# Patient Record
Sex: Female | Born: 1985 | Hispanic: Refuse to answer | Marital: Single | Smoking: Former smoker
Health system: Southern US, Community
[De-identification: ages and names within clinical notes are randomized; demographics above are authoritative.]

## PROBLEM LIST (undated history)

## (undated) DIAGNOSIS — R519 Headache, unspecified: Secondary | ICD-10-CM

## (undated) DIAGNOSIS — K219 Gastro-esophageal reflux disease without esophagitis: Secondary | ICD-10-CM

## (undated) DIAGNOSIS — I10 Essential (primary) hypertension: Secondary | ICD-10-CM

## (undated) DIAGNOSIS — O24419 Gestational diabetes mellitus in pregnancy, unspecified control: Secondary | ICD-10-CM

## (undated) DIAGNOSIS — E785 Hyperlipidemia, unspecified: Secondary | ICD-10-CM

## (undated) DIAGNOSIS — G4730 Sleep apnea, unspecified: Secondary | ICD-10-CM

## (undated) DIAGNOSIS — E119 Type 2 diabetes mellitus without complications: Secondary | ICD-10-CM

## (undated) DIAGNOSIS — K829 Disease of gallbladder, unspecified: Secondary | ICD-10-CM

## (undated) DIAGNOSIS — IMO0001 Reserved for inherently not codable concepts without codable children: Secondary | ICD-10-CM

## (undated) DIAGNOSIS — Z8719 Personal history of other diseases of the digestive system: Secondary | ICD-10-CM

## (undated) DIAGNOSIS — F419 Anxiety disorder, unspecified: Secondary | ICD-10-CM

## (undated) DIAGNOSIS — M2550 Pain in unspecified joint: Secondary | ICD-10-CM

## (undated) DIAGNOSIS — Z720 Tobacco use: Secondary | ICD-10-CM

## (undated) DIAGNOSIS — K5792 Diverticulitis of intestine, part unspecified, without perforation or abscess without bleeding: Secondary | ICD-10-CM

## (undated) DIAGNOSIS — M549 Dorsalgia, unspecified: Secondary | ICD-10-CM

## (undated) DIAGNOSIS — R51 Headache: Secondary | ICD-10-CM

## (undated) HISTORY — DX: Type 2 diabetes mellitus without complications: E11.9

## (undated) HISTORY — DX: Anxiety disorder, unspecified: F41.9

## (undated) HISTORY — DX: Disease of gallbladder, unspecified: K82.9

## (undated) HISTORY — DX: Sleep apnea, unspecified: G47.30

## (undated) HISTORY — DX: Dorsalgia, unspecified: M54.9

## (undated) HISTORY — DX: Diverticulitis of intestine, part unspecified, without perforation or abscess without bleeding: K57.92

## (undated) HISTORY — DX: Pain in unspecified joint: M25.50

## (undated) HISTORY — DX: Hyperlipidemia, unspecified: E78.5

## (undated) HISTORY — DX: Personal history of other diseases of the digestive system: Z87.19

## (undated) HISTORY — PX: WISDOM TOOTH EXTRACTION: SHX21

---
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
## 2003-03-20 ENCOUNTER — Emergency Department (HOSPITAL_COMMUNITY): Admission: EM | Admit: 2003-03-20 | Discharge: 2003-03-20 | Payer: Self-pay | Admitting: Emergency Medicine

## 2004-01-08 ENCOUNTER — Emergency Department (HOSPITAL_COMMUNITY): Admission: EM | Admit: 2004-01-08 | Discharge: 2004-01-08 | Payer: Self-pay | Admitting: Family Medicine

## 2006-07-27 ENCOUNTER — Ambulatory Visit (HOSPITAL_COMMUNITY): Admission: RE | Admit: 2006-07-27 | Discharge: 2006-07-27 | Payer: Self-pay | Admitting: Obstetrics and Gynecology

## 2006-08-20 ENCOUNTER — Encounter: Admission: RE | Admit: 2006-08-20 | Discharge: 2006-08-20 | Payer: Self-pay | Admitting: Family Medicine

## 2006-09-01 DIAGNOSIS — O24419 Gestational diabetes mellitus in pregnancy, unspecified control: Secondary | ICD-10-CM

## 2006-09-01 HISTORY — DX: Gestational diabetes mellitus in pregnancy, unspecified control: O24.419

## 2006-10-05 ENCOUNTER — Ambulatory Visit: Payer: Self-pay | Admitting: Obstetrics & Gynecology

## 2006-10-12 ENCOUNTER — Ambulatory Visit: Payer: Self-pay | Admitting: Family Medicine

## 2006-10-19 ENCOUNTER — Ambulatory Visit (HOSPITAL_COMMUNITY): Admission: RE | Admit: 2006-10-19 | Discharge: 2006-10-19 | Payer: Self-pay | Admitting: Obstetrics and Gynecology

## 2006-10-19 ENCOUNTER — Ambulatory Visit: Payer: Self-pay | Admitting: Obstetrics & Gynecology

## 2006-10-26 ENCOUNTER — Ambulatory Visit: Payer: Self-pay | Admitting: Obstetrics & Gynecology

## 2006-11-02 ENCOUNTER — Ambulatory Visit: Payer: Self-pay | Admitting: Family Medicine

## 2006-11-09 ENCOUNTER — Ambulatory Visit: Payer: Self-pay | Admitting: Obstetrics & Gynecology

## 2006-11-16 ENCOUNTER — Ambulatory Visit: Payer: Self-pay | Admitting: Obstetrics & Gynecology

## 2006-11-23 ENCOUNTER — Ambulatory Visit: Payer: Self-pay | Admitting: Obstetrics & Gynecology

## 2006-11-23 ENCOUNTER — Ambulatory Visit (HOSPITAL_COMMUNITY): Admission: RE | Admit: 2006-11-23 | Discharge: 2006-11-23 | Payer: Self-pay | Admitting: Obstetrics & Gynecology

## 2006-11-26 ENCOUNTER — Ambulatory Visit: Payer: Self-pay | Admitting: Gynecology

## 2006-11-30 ENCOUNTER — Inpatient Hospital Stay (HOSPITAL_COMMUNITY): Admission: AD | Admit: 2006-11-30 | Discharge: 2006-12-03 | Payer: Self-pay | Admitting: Gynecology

## 2006-11-30 ENCOUNTER — Ambulatory Visit: Payer: Self-pay | Admitting: *Deleted

## 2006-11-30 ENCOUNTER — Ambulatory Visit: Payer: Self-pay | Admitting: Obstetrics & Gynecology

## 2007-01-19 ENCOUNTER — Encounter: Admission: RE | Admit: 2007-01-19 | Discharge: 2007-01-19 | Payer: Self-pay | Admitting: Family Medicine

## 2007-02-08 ENCOUNTER — Emergency Department (HOSPITAL_COMMUNITY): Admission: EM | Admit: 2007-02-08 | Discharge: 2007-02-08 | Payer: Self-pay | Admitting: Emergency Medicine

## 2007-02-25 ENCOUNTER — Emergency Department (HOSPITAL_COMMUNITY): Admission: EM | Admit: 2007-02-25 | Discharge: 2007-02-25 | Payer: Self-pay | Admitting: Emergency Medicine

## 2007-03-08 ENCOUNTER — Ambulatory Visit: Payer: Self-pay | Admitting: Internal Medicine

## 2007-03-09 ENCOUNTER — Ambulatory Visit (HOSPITAL_COMMUNITY): Admission: RE | Admit: 2007-03-09 | Discharge: 2007-03-09 | Payer: Self-pay | Admitting: Internal Medicine

## 2007-03-23 ENCOUNTER — Emergency Department (HOSPITAL_COMMUNITY): Admission: EM | Admit: 2007-03-23 | Discharge: 2007-03-23 | Payer: Self-pay | Admitting: Emergency Medicine

## 2007-03-27 ENCOUNTER — Emergency Department (HOSPITAL_COMMUNITY): Admission: EM | Admit: 2007-03-27 | Discharge: 2007-03-27 | Payer: Self-pay | Admitting: Emergency Medicine

## 2007-04-29 ENCOUNTER — Emergency Department (HOSPITAL_COMMUNITY): Admission: EM | Admit: 2007-04-29 | Discharge: 2007-04-29 | Payer: Self-pay | Admitting: Family Medicine

## 2007-09-02 DIAGNOSIS — K219 Gastro-esophageal reflux disease without esophagitis: Secondary | ICD-10-CM

## 2007-09-02 HISTORY — DX: Gastro-esophageal reflux disease without esophagitis: K21.9

## 2008-01-05 ENCOUNTER — Emergency Department (HOSPITAL_COMMUNITY): Admission: EM | Admit: 2008-01-05 | Discharge: 2008-01-05 | Payer: Self-pay | Admitting: Emergency Medicine

## 2008-01-29 ENCOUNTER — Emergency Department (HOSPITAL_COMMUNITY): Admission: EM | Admit: 2008-01-29 | Discharge: 2008-01-30 | Payer: Self-pay | Admitting: Emergency Medicine

## 2008-02-07 ENCOUNTER — Emergency Department (HOSPITAL_COMMUNITY): Admission: EM | Admit: 2008-02-07 | Discharge: 2008-02-07 | Payer: Self-pay | Admitting: Emergency Medicine

## 2008-02-15 ENCOUNTER — Emergency Department (HOSPITAL_COMMUNITY): Admission: EM | Admit: 2008-02-15 | Discharge: 2008-02-15 | Payer: Self-pay | Admitting: Family Medicine

## 2008-07-15 ENCOUNTER — Emergency Department (HOSPITAL_COMMUNITY): Admission: EM | Admit: 2008-07-15 | Discharge: 2008-07-15 | Payer: Self-pay | Admitting: Emergency Medicine

## 2009-02-13 ENCOUNTER — Emergency Department (HOSPITAL_COMMUNITY): Admission: EM | Admit: 2009-02-13 | Discharge: 2009-02-13 | Payer: Self-pay | Admitting: Emergency Medicine

## 2009-05-19 ENCOUNTER — Emergency Department (HOSPITAL_COMMUNITY): Admission: EM | Admit: 2009-05-19 | Discharge: 2009-05-19 | Payer: Self-pay | Admitting: Emergency Medicine

## 2009-10-23 ENCOUNTER — Emergency Department (HOSPITAL_COMMUNITY): Admission: EM | Admit: 2009-10-23 | Discharge: 2009-10-23 | Payer: Self-pay | Admitting: Emergency Medicine

## 2009-11-05 ENCOUNTER — Encounter: Admission: RE | Admit: 2009-11-05 | Discharge: 2009-11-05 | Payer: Self-pay | Admitting: Family Medicine

## 2009-11-21 ENCOUNTER — Encounter: Admission: RE | Admit: 2009-11-21 | Discharge: 2009-11-21 | Payer: Self-pay | Admitting: Family Medicine

## 2010-01-14 ENCOUNTER — Emergency Department (HOSPITAL_COMMUNITY): Admission: EM | Admit: 2010-01-14 | Discharge: 2010-01-14 | Payer: Self-pay | Admitting: Emergency Medicine

## 2010-02-25 ENCOUNTER — Emergency Department (HOSPITAL_COMMUNITY): Admission: EM | Admit: 2010-02-25 | Discharge: 2010-02-25 | Payer: Self-pay | Admitting: Emergency Medicine

## 2010-03-07 ENCOUNTER — Emergency Department (HOSPITAL_COMMUNITY): Admission: EM | Admit: 2010-03-07 | Discharge: 2010-03-07 | Payer: Self-pay | Admitting: Emergency Medicine

## 2010-11-17 LAB — COMPREHENSIVE METABOLIC PANEL
ALT: 15 U/L (ref 0–35)
AST: 16 U/L (ref 0–37)
Alkaline Phosphatase: 64 U/L (ref 39–117)
BUN: 11 mg/dL (ref 6–23)
Calcium: 8.9 mg/dL (ref 8.4–10.5)
Creatinine, Ser: 0.7 mg/dL (ref 0.4–1.2)
GFR calc Af Amer: >60 mL/min (ref >60)
GFR calc non Af Amer: >60 mL/min (ref >60)
Total Bilirubin: 0.4 mg/dL (ref 0.3–1.2)

## 2010-11-17 LAB — URINALYSIS, ROUTINE W REFLEX MICROSCOPIC
Nitrite: NEGATIVE
Protein, ur: NEGATIVE mg/dL
Specific Gravity, Urine: 1.027 (ref 1.005–1.030)
Urobilinogen, UA: 0.2 mg/dL (ref 0.0–1.0)
pH: 6 (ref 5.0–8.0)

## 2010-11-17 LAB — LIPASE, BLOOD: Lipase: 40 U/L (ref 11–59)

## 2010-11-17 LAB — CBC
MCH: 30.3 pg (ref 26.0–34.0)
MCV: 88.4 fL (ref 78.0–100.0)
Platelets: 394 10*3/uL (ref 150–400)
RDW: 14 % (ref 11.5–15.5)
WBC: 16.1 10*3/uL — ABNORMAL HIGH (ref 4.0–10.5)

## 2010-11-17 LAB — DIFFERENTIAL
Basophils Relative: 0 % (ref 0–1)
Neutro Abs: 11.8 10*3/uL — ABNORMAL HIGH (ref 1.7–7.7)
Neutrophils Relative %: 73 % (ref 43–77)

## 2010-11-17 LAB — POCT CARDIAC MARKERS
Myoglobin, poc: 152 ng/mL (ref 12–200)
Troponin i, poc: <0.05 ng/mL (ref 0.00–0.09)

## 2010-12-06 LAB — POCT PREGNANCY, URINE: Preg Test, Ur: NEGATIVE

## 2011-06-16 LAB — POCT PREGNANCY, URINE
Operator id: 247071
Preg Test, Ur: NEGATIVE

## 2011-06-18 LAB — I-STAT 8, (EC8 V) (CONVERTED LAB)
Chloride: 107
TCO2: 26
pCO2, Ven: 40.3 — ABNORMAL LOW
pH, Ven: 7.392 — ABNORMAL HIGH

## 2011-06-19 LAB — POCT PREGNANCY, URINE
Operator id: 277751
Preg Test, Ur: NEGATIVE

## 2011-06-19 LAB — URINALYSIS, ROUTINE W REFLEX MICROSCOPIC
Bilirubin Urine: NEGATIVE
Glucose, UA: NEGATIVE
Hgb urine dipstick: NEGATIVE
Ketones, ur: NEGATIVE
Nitrite: NEGATIVE
Protein, ur: NEGATIVE
Specific Gravity, Urine: 1.031 — ABNORMAL HIGH
Urobilinogen, UA: 0.2
pH: 6.5

## 2014-12-29 ENCOUNTER — Emergency Department (HOSPITAL_COMMUNITY): Payer: Medicaid Other

## 2014-12-29 ENCOUNTER — Encounter (HOSPITAL_COMMUNITY): Payer: Self-pay | Admitting: *Deleted

## 2014-12-29 ENCOUNTER — Emergency Department (HOSPITAL_COMMUNITY)
Admission: EM | Admit: 2014-12-29 | Discharge: 2014-12-29 | Disposition: A | Discharge status: Home / Self Care | Payer: Medicaid Other | Attending: Emergency Medicine | Admitting: Emergency Medicine

## 2014-12-29 DIAGNOSIS — Z720 Tobacco use: Secondary | ICD-10-CM | POA: Insufficient documentation

## 2014-12-29 DIAGNOSIS — M25552 Pain in left hip: Secondary | ICD-10-CM | POA: Diagnosis not present

## 2014-12-29 DIAGNOSIS — I10 Essential (primary) hypertension: Secondary | ICD-10-CM | POA: Diagnosis not present

## 2014-12-29 DIAGNOSIS — M25559 Pain in unspecified hip: Secondary | ICD-10-CM

## 2014-12-29 HISTORY — DX: Essential (primary) hypertension: I10

## 2014-12-29 MED ORDER — MELOXICAM 7.5 MG PO TABS: 7.5000 mg | ORAL_TABLET | Freq: Every day | ORAL | Status: DC

## 2014-12-29 MED ORDER — NAPROXEN 250 MG PO TABS
500.0000 mg | ORAL_TABLET | Freq: Once | ORAL | Status: AC
  Administered 2014-12-29: 500 mg via ORAL
  Filled 2014-12-29: qty 2

## 2014-12-29 NOTE — ED Notes (Signed)
Pt states L hip pain that radiates to L leg since Thursday.  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

## 2014-12-29 NOTE — ED Provider Notes (Signed)
CSN: [id number]     Arrival date & time 12/29/14  1658 History  This chart was scribed for Felicie Mornavid Kylle Lall, NP working with Geoffery Lyonsouglas Delo, MD by Evon Slackerrance Branch, ED Scribe. This patient was seen in room TR08C/TR08C and the patient's care was started at 5:26 PM.     Chief Complaint  Patient presents with  . Hip Pain   Patient is a 29 y.o. female presenting with hip pain. The history is provided by the patient. No language interpreter was used.  Hip Pain   HPI Comments: Megan MansKetrina L Casique is a 29 y.o. female with PMHx of HTN who presents to the Emergency Department complaining of sudden sharp-aching left hip pain that began 1 day ago. Pt states that the pain radiates down into her left leg. Pt denies injury or fall. Pt doesn't report any alleviating factors. Pt states that the pain is worse with movement. Pt states she has tried ibuprofen with no relief. Pt denies fever, cough, congestion or other related symptoms.    Past Medical History  Diagnosis Date  . Hypertension    History reviewed. No pertinent past surgical history. No family history on file. History  Substance Use Topics  . Smoking status: Current Every Day Smoker -- 0.25 packs/day for 0 years    Types: Cigarettes  . Smokeless tobacco: Not on file  . Alcohol Use: Yes     Comment: occ   OB History    No data available      Review of Systems  Constitutional: Negative for fever.  HENT: Negative for congestion.   Respiratory: Negative for cough.   Musculoskeletal: Positive for arthralgias.  All other systems reviewed and are negative.   Allergies  Review of patient's allergies indicates no known allergies.  Home Medications   Prior to Admission medications   Not on File   BP 132/84 mmHg  Pulse 87  Temp(Src) 98.8 F (37.1 C) (Oral)  Resp 16  Ht 5\' 3"  (1.6 m)  Wt 248 lb (112.492 kg)  BMI 43.94 kg/m2  SpO2 99%  LMP  (LMP Unknown)   Physical Exam  Constitutional: She is oriented to person, place, and time. She  appears well-developed and well-nourished. No distress.  HENT:  Head: Normocephalic and atraumatic.  Eyes: Conjunctivae and EOM are normal.  Neck: Neck supple. No tracheal deviation present.  Cardiovascular: Normal rate, regular rhythm and normal heart sounds.   Pulmonary/Chest: Effort normal and breath sounds normal. No respiratory distress. She has no wheezes. She has no rales.  Musculoskeletal: Normal range of motion. She exhibits tenderness.  tenderness to left hip area increased with ROM, no known injury.  Neurological: She is alert and oriented to person, place, and time.  Skin: Skin is warm and dry.  Psychiatric: She has a normal mood and affect. Her behavior is normal.  Nursing note and vitals reviewed.   ED Course  Procedures (including critical care time) DIAGNOSTIC STUDIES: Oxygen Saturation is 99% on RA, normal by my interpretation.    COORDINATION OF CARE: 5:38 PM-Discussed treatment plan with pt at bedside and pt agreed to plan.     Labs Review Labs Reviewed - No data to display  Imaging Review Dg Hip Unilat With Pelvis 2-3 Views Left  12/29/2014   CLINICAL DATA:  Left hip pain for 2 days.  No known injury.  EXAM: LEFT HIP (WITH PELVIS) 2-3 VIEWS  COMPARISON:  None.  FINDINGS: There is no evidence of hip fracture or dislocation. There is no evidence  of arthropathy or other focal bone abnormality.  IMPRESSION: Negative.   Electronically Signed   By: Norva Pavlov M.D.   On: 12/29/2014 20:53     EKG Interpretation None     Radiology results reviewed and shared with patient. MDM   Final diagnoses:  Hip pain  Musculoskeletal left hip pain.  Normal hip films.  Pain seems to be more muscular than bony in nature.  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 Anti-inflammatory. Ortho follow-up if no improvement despite conservative treatment. Return precautions discussed.    I personally performed the services described in  this documentation, which was scribed in my presence. The recorded information has been reviewed and is accurate.      Felicie Morn, NP 12/30/14 1610  Geoffery Lyons, MD 12/30/14 [phone]

## 2014-12-29 NOTE — ED Notes (Signed)
Patient transported to X-ray 

## 2014-12-29 NOTE — Discharge Instructions (Signed)
Hip Pain Your hip is the joint between your upper legs and your lower pelvis. The bones, cartilage, tendons, and muscles of your hip joint perform a lot of work each day supporting your body weight and allowing you to move around. Hip pain can range from a minor ache to severe pain in one or both of your hips. Pain may be felt on the inside of the hip joint near the groin, or the outside near the buttocks and upper thigh. You may have swelling or stiffness as well.  HOME CARE INSTRUCTIONS   Take medicines only as directed by your health care provider.  Apply ice to the injured area:  Put ice in a plastic bag.  Place a towel between your skin and the bag.  Leave the ice on for 15-20 minutes at a time, 3-4 times a day.  Keep your leg raised (elevated) when possible to lessen swelling.  Avoid activities that cause pain.  Follow specific exercises as directed by your health care provider.  Sleep with a pillow between your legs on your most comfortable side.  Record how often you have hip pain, the location of the pain, and what it feels like. SEEK MEDICAL CARE IF:   You are unable to put weight on your leg.  Your hip is red or swollen or very tender to touch.  Your pain or swelling continues or worsens after 1 week.  You have increasing difficulty walking.  You have a fever. SEEK IMMEDIATE MEDICAL CARE IF:   You have fallen.  You have a sudden increase in pain and swelling in your hip. MAKE SURE YOU:   Understand these instructions.  Will watch your condition.  Will get help right away if you are not doing well or get worse. Document Released: 02/05/2010 Document Revised: 01/02/2014 Document Reviewed: 04/14/2013 [employer] Patient Information 2015 [employer]. This information is not intended to replace advice given to you by your health care provider. Make sure you discuss any questions you have with your health care provider.  

## 2015-02-02 ENCOUNTER — Other Ambulatory Visit: Payer: Self-pay | Admitting: Orthopedic Surgery

## 2015-02-02 DIAGNOSIS — M545 Low back pain: Secondary | ICD-10-CM

## 2015-02-15 ENCOUNTER — Ambulatory Visit
Admission: RE | Admit: 2015-02-15 | Discharge: 2015-02-15 | Disposition: A | Discharge status: Home / Self Care | Payer: BLUE CROSS/BLUE SHIELD | Source: Ambulatory Visit | Attending: Orthopedic Surgery | Admitting: Orthopedic Surgery

## 2015-02-15 DIAGNOSIS — M545 Low back pain: Secondary | ICD-10-CM

## 2015-02-20 ENCOUNTER — Emergency Department (HOSPITAL_COMMUNITY): Payer: BLUE CROSS/BLUE SHIELD

## 2015-02-20 ENCOUNTER — Encounter (HOSPITAL_COMMUNITY): Payer: Self-pay | Admitting: Nurse Practitioner

## 2015-02-20 ENCOUNTER — Emergency Department (HOSPITAL_COMMUNITY)
Admission: EM | Admit: 2015-02-20 | Discharge: 2015-02-20 | Disposition: A | Discharge status: Home / Self Care | Payer: BLUE CROSS/BLUE SHIELD | Attending: Emergency Medicine | Admitting: Emergency Medicine

## 2015-02-20 DIAGNOSIS — R197 Diarrhea, unspecified: Secondary | ICD-10-CM | POA: Insufficient documentation

## 2015-02-20 DIAGNOSIS — I10 Essential (primary) hypertension: Secondary | ICD-10-CM | POA: Insufficient documentation

## 2015-02-20 DIAGNOSIS — R509 Fever, unspecified: Secondary | ICD-10-CM | POA: Diagnosis present

## 2015-02-20 DIAGNOSIS — Z79899 Other long term (current) drug therapy: Secondary | ICD-10-CM | POA: Diagnosis not present

## 2015-02-20 DIAGNOSIS — J069 Acute upper respiratory infection, unspecified: Secondary | ICD-10-CM

## 2015-02-20 DIAGNOSIS — R05 Cough: Secondary | ICD-10-CM

## 2015-02-20 DIAGNOSIS — L02213 Cutaneous abscess of chest wall: Secondary | ICD-10-CM | POA: Diagnosis not present

## 2015-02-20 DIAGNOSIS — Z720 Tobacco use: Secondary | ICD-10-CM | POA: Diagnosis not present

## 2015-02-20 DIAGNOSIS — R110 Nausea: Secondary | ICD-10-CM | POA: Diagnosis not present

## 2015-02-20 DIAGNOSIS — L0291 Cutaneous abscess, unspecified: Secondary | ICD-10-CM

## 2015-02-20 DIAGNOSIS — R059 Cough, unspecified: Secondary | ICD-10-CM

## 2015-02-20 LAB — COMPREHENSIVE METABOLIC PANEL
ALT: 16 U/L (ref 14–54)
ANION GAP: 11 (ref 5–15)
AST: 18 U/L (ref 15–41)
Albumin: 3.1 g/dL — ABNORMAL LOW (ref 3.5–5.0)
Alkaline Phosphatase: 58 U/L (ref 38–126)
BILIRUBIN TOTAL: 0.3 mg/dL (ref 0.3–1.2)
BUN: 8 mg/dL (ref 6–20)
CO2: 26 mmol/L (ref 22–32)
CREATININE: 0.72 mg/dL (ref 0.44–1.00)
Calcium: 8.8 mg/dL — ABNORMAL LOW (ref 8.9–10.3)
Chloride: 102 mmol/L (ref 101–111)
GFR calc Af Amer: >60 mL/min (ref >60)
GFR calc non Af Amer: >60 mL/min (ref >60)
Glucose, Bld: 90 mg/dL (ref 65–99)
Potassium: 3.6 mmol/L (ref 3.5–5.1)
SODIUM: 139 mmol/L (ref 135–145)
TOTAL PROTEIN: 7.1 g/dL (ref 6.5–8.1)

## 2015-02-20 LAB — CBC WITH DIFFERENTIAL/PLATELET
BASOS ABS: 0 10*3/uL (ref 0.0–0.1)
Basophils Relative: 0 % (ref 0–1)
Eosinophils Absolute: 0.3 10*3/uL (ref 0.0–0.7)
Eosinophils Relative: 2 % (ref 0–5)
HCT: 35.1 % — ABNORMAL LOW (ref 36.0–46.0)
HEMOGLOBIN: 11.9 g/dL — AB (ref 12.0–15.0)
Lymphocytes Relative: 26 % (ref 12–46)
Lymphs Abs: 3.3 10*3/uL (ref 0.7–4.0)
MCH: 29 pg (ref 26.0–34.0)
MCHC: 33.9 g/dL (ref 30.0–36.0)
MCV: 85.6 fL (ref 78.0–100.0)
Monocytes Absolute: 0.9 10*3/uL (ref 0.1–1.0)
Monocytes Relative: 7 % (ref 3–12)
NEUTROS ABS: 8.5 10*3/uL — AB (ref 1.7–7.7)
NEUTROS PCT: 65 % (ref 43–77)
Platelets: 363 10*3/uL (ref 150–400)
RBC: 4.1 MIL/uL (ref 3.87–5.11)
RDW: 13 % (ref 11.5–15.5)
WBC: 13 10*3/uL — ABNORMAL HIGH (ref 4.0–10.5)

## 2015-02-20 LAB — URINALYSIS, ROUTINE W REFLEX MICROSCOPIC
Bilirubin Urine: NEGATIVE
Glucose, UA: NEGATIVE mg/dL
Ketones, ur: NEGATIVE mg/dL
LEUKOCYTES UA: NEGATIVE
NITRITE: NEGATIVE
PH: 6.5 (ref 5.0–8.0)
Protein, ur: NEGATIVE mg/dL
SPECIFIC GRAVITY, URINE: 1.016 (ref 1.005–1.030)
Urobilinogen, UA: 0.2 mg/dL (ref 0.0–1.0)

## 2015-02-20 LAB — URINE MICROSCOPIC-ADD ON

## 2015-02-20 LAB — POC URINE PREG, ED: Preg Test, Ur: NEGATIVE

## 2015-02-20 MED ORDER — LIDOCAINE-EPINEPHRINE 1 %-1:100000 IJ SOLN
10.0000 mL | Freq: Once | INTRAMUSCULAR | Status: AC
  Administered 2015-02-20: 10 mL via INTRADERMAL
  Filled 2015-02-20: qty 1

## 2015-02-20 NOTE — ED Provider Notes (Signed)
CSN: [id number]     Arrival date & time 02/20/15  1440 History   First MD Initiated Contact with Patient 02/20/15 1635     Chief Complaint  Patient presents with  . Fever     (Consider location/radiation/quality/duration/timing/severity/associated sxs/prior Treatment) Patient is a 29 y.o. female presenting with fever and abscess.  Fever Temp source:  Subjective Onset quality:  Gradual Duration:  3 days Timing:  Constant Progression:  Waxing and waning Chronicity:  New Relieved by:  None tried Worsened by:  Nothing tried Ineffective treatments:  None tried Associated symptoms: chills, congestion, cough, diarrhea, headaches, nausea, rhinorrhea and sore throat   Associated symptoms: no chest pain, no dysuria, no ear pain, no rash and no vomiting   Risk factors: sick contacts (son had URI 2 weeks ago)   Risk factors: no hx of cancer, no immunosuppression and no occupational exposure   Abscess Location:  Torso Torso abscess location: mid chest. Size:  1.5 cm Abscess quality: draining, fluctuance, induration and redness   Red streaking: no   Duration:  2 days Progression:  Worsening Chronicity:  New Context: not diabetes, not immunosuppression, not injected drug use, not insect bite/sting and not skin injury   Relieved by:  None tried Worsened by:  Nothing tried Ineffective treatments:  None tried Associated symptoms: fever, headaches and nausea   Associated symptoms: no fatigue and no vomiting     Past Medical History  Diagnosis Date  . Hypertension    History reviewed. No pertinent past surgical history. History reviewed. No pertinent family history. History  Substance Use Topics  . Smoking status: Current Every Day Smoker -- 0.25 packs/day for 0 years    Types: Cigarettes  . Smokeless tobacco: Not on file  . Alcohol Use: Yes     Comment: occ   OB History    No data available     Review of Systems  Constitutional: Positive for fever and chills. Negative for  appetite change and fatigue.  HENT: Positive for congestion, rhinorrhea and sore throat. Negative for ear pain, facial swelling and mouth sores.   Eyes: Negative for visual disturbance.  Respiratory: Positive for cough. Negative for chest tightness and shortness of breath.   Cardiovascular: Negative for chest pain and palpitations.  Gastrointestinal: Positive for nausea and diarrhea. Negative for vomiting, abdominal pain and blood in stool.  Endocrine: Negative for cold intolerance and heat intolerance.  Genitourinary: Negative for dysuria, frequency, decreased urine volume and difficulty urinating.  Musculoskeletal: Negative for back pain and neck stiffness.  Skin: Negative for rash.  Neurological: Positive for headaches. Negative for dizziness, weakness and light-headedness.  All other systems reviewed and are negative.     Allergies  Review of patient's allergies indicates no known allergies.  Home Medications   Prior to Admission medications   Medication Sig Start Date End Date Taking? Authorizing Provider  meloxicam (MOBIC) 7.5 MG tablet Take 1 tablet (7.5 mg total) by mouth daily. 12/29/14   Felicie Morn, NP   BP 126/70 mmHg  Pulse 83  Temp(Src) 98.5 F (36.9 C) (Oral)  Resp 18  Ht  (1.6 m)  Wt 248 lb 4.8 oz (112.628 kg)  BMI 44.00 kg/m2  SpO2 100% Physical Exam  Constitutional: She is oriented to person, place, and time. She appears well-developed and well-nourished. No distress.  HENT:  Head: Normocephalic and atraumatic.  Right Ear: External ear normal.  Left Ear: External ear normal.  Nose: Nose normal.  Mild irritation of posterior oropharynx. No  exudate. No PTA.  Eyes: Conjunctivae and EOM are normal. Pupils are equal, round, and reactive to light. Right eye exhibits no discharge. Left eye exhibits no discharge. No scleral icterus.  Neck: Normal range of motion. Neck supple.  No LAD  Cardiovascular: Normal rate, regular rhythm and normal heart sounds.  Exam  reveals no gallop and no friction rub.   No murmur heard. Pulmonary/Chest: Effort normal and breath sounds normal. No stridor. No respiratory distress. She has no wheezes.  Small 1.5 cm abscess over the sternum  Abdominal: Soft. She exhibits no distension. There is no tenderness.  Musculoskeletal: She exhibits no edema or tenderness.  Neurological: She is alert and oriented to person, place, and time.  Skin: Skin is warm and dry. No rash noted. She is not diaphoretic. No erythema.  Psychiatric: She has a normal mood and affect.    ED Course  INCISION AND DRAINAGE Date/Time: 02/20/2015 5:35 PM Performed by: Drema Pry Authorized by: Zadie Rhine Consent: Verbal consent obtained. Consent given by: patient Patient identity confirmed: arm band Type: abscess Body area: trunk Location details: chest Anesthesia: local infiltration Local anesthetic: lidocaine 1% with epinephrine Anesthetic total: 2 ml Patient sedated: no Scalpel size: 11 Incision type: elliptical Complexity: complex Drainage: purulent Drainage amount: moderate Wound treatment: wound left open Patient tolerance: Patient tolerated the procedure well with no immediate complications   (including critical care time) Labs Review Labs Reviewed  COMPREHENSIVE METABOLIC PANEL - Abnormal; Notable for the following:    Calcium 8.8 (*)    Albumin 3.1 (*)    All other components within normal limits  CBC WITH DIFFERENTIAL/PLATELET - Abnormal; Notable for the following:    WBC 13.0 (*)    Hemoglobin 11.9 (*)    HCT 35.1 (*)    Neutro Abs 8.5 (*)    All other components within normal limits  URINALYSIS, ROUTINE W REFLEX MICROSCOPIC (NOT AT [facility])  POC URINE PREG, ED    Imaging Review Dg Chest 2 View  02/20/2015   CLINICAL DATA:  Cough and fever for 3 days  EXAM: CHEST  2 VIEW  COMPARISON:  02/25/2010  FINDINGS: The heart size and mediastinal contours are within normal limits. Both lungs are clear. The visualized  skeletal structures are unremarkable.  IMPRESSION: No active cardiopulmonary disease.   Electronically Signed   By: Signa Kell M.D.   On: 02/20/2015 17:18     EKG Interpretation None      MDM   29 year old female with no pertinent past medical history presents with 2 days of fevers, chills, URI symptoms, cough, diarrhea. Patient reports that her son had viral URI 2 weeks ago. No other sick contacts reported. No recent travels. No history of HIV. Rest of the history and exam as above. No evidence of meningitis, or toxicity. Presentation consistent with likely viral infection. We'll obtain chest x-ray to rule out pneumonia. No evidence of pneumonia or other infectious process on chest x-ray. Abdomen benign. Doubt appendicitis, diverticulitis, cholecystitis, small bowel obstruction.   Additionally the patient reported abscess in the mid chest. Ultrasound obtained showing fluid filled lesions suggestive of a drainable abscess. I&D was performed as above.  Patient is well-appearing, nontoxic, afebrile, with stable vital signs. She is in good condition is stable for discharge with strict return precautions. Patient follow-up with her PCP as needed. Patient will follow-up with [facility] in 2 days for wound check.  Seen in conjunction with Dr. Bebe Shaggy.  Final diagnoses:  None  Drema Pry, MD 02/20/15 1758  Zadie Rhine, MD 02/20/15 [phone]

## 2015-02-20 NOTE — Discharge Instructions (Signed)
Abscess An abscess (boil or furuncle) is an infected area on or under the skin. This area is filled with yellowish-white fluid (pus) and other material (debris). HOME CARE   Only take medicines as told by your doctor.  If you were given antibiotic medicine, take it as directed. Finish the medicine even if you start to feel better.  If gauze is used, follow your doctor's directions for changing the gauze.  To avoid spreading the infection:  Keep your abscess covered with a bandage.  Wash your hands well.  Do not share personal care items, towels, or whirlpools with others.  Avoid skin contact with others.  Keep your skin and clothes clean around the abscess.  Keep all doctor visits as told. GET HELP RIGHT AWAY IF:   You have more pain, puffiness (swelling), or redness in the wound site.  You have more fluid or blood coming from the wound site.  You have muscle aches, chills, or you feel sick.  You have a fever. MAKE SURE YOU:   Understand these instructions.  Will watch your condition.  Will get help right away if you are not doing well or get worse. Document Released: 02/04/2008 Document Revised: 02/17/2012 Document Reviewed: 10/31/2011 [facility] Patient Information 2015 [place], [place]. This information is not intended to replace advice given to you by your health care provider. Make sure you discuss any questions you have with your health care provider.  Diarrhea Diarrhea is frequent loose and watery bowel movements. It can cause you to feel weak and dehydrated. Dehydration can cause you to become tired and thirsty, have a dry mouth, and have decreased urination that often is dark yellow. Diarrhea is a sign of another problem, most often an infection that will not last long. In most cases, diarrhea typically lasts 2-3 days. However, it can last longer if it is a sign of something more serious. It is important to treat your diarrhea as directed by your caregiver to lessen  or prevent future episodes of diarrhea. CAUSES  Some common causes include:  Gastrointestinal infections caused by viruses, bacteria, or parasites.  Food poisoning or food allergies.  Certain medicines, such as antibiotics, chemotherapy, and laxatives.  Artificial sweeteners and fructose.  Digestive disorders. HOME CARE INSTRUCTIONS  Ensure adequate fluid intake (hydration): Have 1 cup (8 oz) of fluid for each diarrhea episode. Avoid fluids that contain simple sugars or sports drinks, fruit juices, whole milk products, and sodas. Your urine should be clear or pale yellow if you are drinking enough fluids. Hydrate with an oral rehydration solution that you can purchase at pharmacies, retail stores, and online. You can prepare an oral rehydration solution at home by mixing the following ingredients together:   - tsp table salt.   tsp baking soda.   tsp salt substitute containing potassium chloride.  1  tablespoons sugar.  1 L (34 oz) of water.  Certain foods and beverages may increase the speed at which food moves through the gastrointestinal (GI) tract. These foods and beverages should be avoided and include:  Caffeinated and alcoholic beverages.  High-fiber foods, such as raw fruits and vegetables, nuts, seeds, and whole grain breads and cereals.  Foods and beverages sweetened with sugar alcohols, such as xylitol, sorbitol, and mannitol.  Some foods may be well tolerated and may help thicken stool including:  Starchy foods, such as rice, toast, pasta, low-sugar cereal, oatmeal, grits, baked potatoes, crackers, and bagels.  Bananas.  Applesauce.  Add probiotic-rich foods to help increase healthy  bacteria in the GI tract, such as yogurt and fermented milk products.  Wash your hands well after each diarrhea episode.  Only take over-the-counter or prescription medicines as directed by your caregiver.  Take a warm bath to relieve any burning or pain from frequent diarrhea  episodes. SEEK IMMEDIATE MEDICAL CARE IF:   You are unable to keep fluids down.  You have persistent vomiting.  You have blood in your stool, or your stools are black and tarry.  You do not urinate in 6-8 hours, or there is only a small amount of very dark urine.  You have abdominal pain that increases or localizes.  You have weakness, dizziness, confusion, or light-headedness.  You have a severe headache.  Your diarrhea gets worse or does not get better.  You have a fever or persistent symptoms for more than 2-3 days.  You have a fever and your symptoms suddenly get worse. MAKE SURE YOU:   Understand these instructions.  Will watch your condition.  Will get help right away if you are not doing well or get worse. Document Released: 08/08/2002 Document Revised: 01/02/2014 Document Reviewed: 04/25/2012 [facility] Patient Information 2015 [place], [place]. This information is not intended to replace advice given to you by your health care provider. Make sure you discuss any questions you have with your health care provider.  Upper Respiratory Infection, Adult An upper respiratory infection (URI) is also sometimes known as the common cold. The upper respiratory tract includes the nose, sinuses, throat, trachea, and bronchi. Bronchi are the airways leading to the lungs. Most people improve within 1 week, but symptoms can last up to 2 weeks. A residual cough may last even longer.  CAUSES Many different viruses can infect the tissues lining the upper respiratory tract. The tissues become irritated and inflamed and often become very moist. Mucus production is also common. A cold is contagious. You can easily spread the virus to others by oral contact. This includes kissing, sharing a glass, coughing, or sneezing. Touching your mouth or nose and then touching a surface, which is then touched by another person, can also spread the virus. SYMPTOMS  Symptoms typically develop 1 to 3 days after  you come in contact with a cold virus. Symptoms vary from person to person. They may include:  Runny nose.  Sneezing.  Nasal congestion.  Sinus irritation.  Sore throat.  Loss of voice (laryngitis).  Cough.  Fatigue.  Muscle aches.  Loss of appetite.  Headache.  Low-grade fever. DIAGNOSIS  You might diagnose your own cold based on familiar symptoms, since most people get a cold 2 to 3 times a year. Your caregiver can confirm this based on your exam. Most importantly, your caregiver can check that your symptoms are not due to another disease such as strep throat, sinusitis, pneumonia, asthma, or epiglottitis. Blood tests, throat tests, and X-rays are not necessary to diagnose a common cold, but they may sometimes be helpful in excluding other more serious diseases. Your caregiver will decide if any further tests are required. RISKS AND COMPLICATIONS  You may be at risk for a more severe case of the common cold if you smoke cigarettes, have chronic heart disease (such as heart failure) or lung disease (such as asthma), or if you have a weakened immune system. The very young and very old are also at risk for more serious infections. Bacterial sinusitis, middle ear infections, and bacterial pneumonia can complicate the common cold. The common cold can worsen asthma and chronic  obstructive pulmonary disease (COPD). Sometimes, these complications can require emergency medical care and may be life-threatening. PREVENTION  The best way to protect against getting a cold is to practice good hygiene. Avoid oral or hand contact with people with cold symptoms. Wash your hands often if contact occurs. There is no clear evidence that vitamin C, vitamin E, echinacea, or exercise reduces the chance of developing a cold. However, it is always recommended to get plenty of rest and practice good nutrition. TREATMENT  Treatment is directed at relieving symptoms. There is no cure. Antibiotics are not  effective, because the infection is caused by a virus, not by bacteria. Treatment may include:  Increased fluid intake. Sports drinks offer valuable electrolytes, sugars, and fluids.  Breathing heated mist or steam (vaporizer or shower).  Eating chicken soup or other clear broths, and maintaining good nutrition.  Getting plenty of rest.  Using gargles or lozenges for comfort.  Controlling fevers with ibuprofen or acetaminophen as directed by your caregiver.  Increasing usage of your inhaler if you have asthma. Zinc gel and zinc lozenges, taken in the first 24 hours of the common cold, can shorten the duration and lessen the severity of symptoms. Pain medicines may help with fever, muscle aches, and throat pain. A variety of non-prescription medicines are available to treat congestion and runny nose. Your caregiver can make recommendations and may suggest nasal or lung inhalers for other symptoms.  HOME CARE INSTRUCTIONS   Only take over-the-counter or prescription medicines for pain, discomfort, or fever as directed by your caregiver.  Use a warm mist humidifier or inhale steam from a shower to increase air moisture. This may keep secretions moist and make it easier to breathe.  Drink enough water and fluids to keep your urine clear or pale yellow.  Rest as needed.  Return to work when your temperature has returned to normal or as your caregiver advises. You may need to stay home longer to avoid infecting others. You can also use a face mask and careful hand washing to prevent spread of the virus. SEEK MEDICAL CARE IF:   After the first few days, you feel you are getting worse rather than better.  You need your caregiver's advice about medicines to control symptoms.  You develop chills, worsening shortness of breath, or brown or red sputum. These may be signs of pneumonia.  You develop yellow or brown nasal discharge or pain in the face, especially when you bend forward. These may  be signs of sinusitis.  You develop a fever, swollen neck glands, pain with swallowing, or white areas in the back of your throat. These may be signs of strep throat. SEEK IMMEDIATE MEDICAL CARE IF:   You have a fever.  You develop severe or persistent headache, ear pain, sinus pain, or chest pain.  You develop wheezing, a prolonged cough, cough up blood, or have a change in your usual mucus (if you have chronic lung disease).  You develop sore muscles or a stiff neck. Document Released: 02/11/2001 Document Revised: 11/10/2011 Document Reviewed: 11/23/2013 [facility] Patient Information 2015 [place], [place]. This information is not intended to replace advice given to you by your health care provider. Make sure you discuss any questions you have with your health care provider.

## 2015-02-20 NOTE — ED Notes (Signed)
Patient transported to X-ray 

## 2015-02-20 NOTE — ED Notes (Signed)
Dr. Cardama at the bedside.  

## 2015-02-20 NOTE — ED Notes (Signed)
She c/o headaches, chills, fevers, abd pain, diarrhea increasingly worse since Sunday. She also noticed a painful lump on her chest between her breasts this week that was draining pus.

## 2015-08-10 ENCOUNTER — Emergency Department (HOSPITAL_COMMUNITY)
Admission: EM | Admit: 2015-08-10 | Discharge: 2015-08-10 | Disposition: A | Discharge status: Home / Self Care | Payer: Medicaid Other | Attending: Emergency Medicine | Admitting: Emergency Medicine

## 2015-08-10 ENCOUNTER — Emergency Department (HOSPITAL_COMMUNITY): Payer: Medicaid Other

## 2015-08-10 ENCOUNTER — Encounter (HOSPITAL_COMMUNITY): Payer: Self-pay | Admitting: Emergency Medicine

## 2015-08-10 DIAGNOSIS — E669 Obesity, unspecified: Secondary | ICD-10-CM | POA: Diagnosis not present

## 2015-08-10 DIAGNOSIS — Z3202 Encounter for pregnancy test, result negative: Secondary | ICD-10-CM | POA: Insufficient documentation

## 2015-08-10 DIAGNOSIS — F17210 Nicotine dependence, cigarettes, uncomplicated: Secondary | ICD-10-CM | POA: Insufficient documentation

## 2015-08-10 DIAGNOSIS — K8020 Calculus of gallbladder without cholecystitis without obstruction: Secondary | ICD-10-CM | POA: Diagnosis not present

## 2015-08-10 DIAGNOSIS — I10 Essential (primary) hypertension: Secondary | ICD-10-CM | POA: Insufficient documentation

## 2015-08-10 DIAGNOSIS — R109 Unspecified abdominal pain: Secondary | ICD-10-CM | POA: Diagnosis present

## 2015-08-10 DIAGNOSIS — K8050 Calculus of bile duct without cholangitis or cholecystitis without obstruction: Secondary | ICD-10-CM

## 2015-08-10 LAB — CBC
HCT: 40.5 % (ref 36.0–46.0)
Hemoglobin: 13.3 g/dL (ref 12.0–15.0)
MCH: 29.4 pg (ref 26.0–34.0)
MCHC: 32.8 g/dL (ref 30.0–36.0)
MCV: 89.4 fL (ref 78.0–100.0)
PLATELETS: 463 10*3/uL — AB (ref 150–400)
RBC: 4.53 MIL/uL (ref 3.87–5.11)
RDW: 13.1 % (ref 11.5–15.5)
WBC: 14.6 10*3/uL — AB (ref 4.0–10.5)

## 2015-08-10 LAB — COMPREHENSIVE METABOLIC PANEL
ALBUMIN: 4.1 g/dL (ref 3.5–5.0)
ALT: 21 U/L (ref 14–54)
ANION GAP: 9 (ref 5–15)
AST: 18 U/L (ref 15–41)
Alkaline Phosphatase: 70 U/L (ref 38–126)
BUN: 14 mg/dL (ref 6–20)
CHLORIDE: 107 mmol/L (ref 101–111)
CO2: 23 mmol/L (ref 22–32)
CREATININE: 0.7 mg/dL (ref 0.44–1.00)
Calcium: 9.3 mg/dL (ref 8.9–10.3)
GFR calc non Af Amer: >60 mL/min (ref >60)
Glucose, Bld: 88 mg/dL (ref 65–99)
Potassium: 3.7 mmol/L (ref 3.5–5.1)
SODIUM: 139 mmol/L (ref 135–145)
Total Bilirubin: 0.4 mg/dL (ref 0.3–1.2)
Total Protein: 8.2 g/dL — ABNORMAL HIGH (ref 6.5–8.1)

## 2015-08-10 LAB — URINALYSIS, ROUTINE W REFLEX MICROSCOPIC
Bilirubin Urine: NEGATIVE
GLUCOSE, UA: NEGATIVE mg/dL
HGB URINE DIPSTICK: NEGATIVE
KETONES UR: NEGATIVE mg/dL
Leukocytes, UA: NEGATIVE
Nitrite: NEGATIVE
Protein, ur: NEGATIVE mg/dL
Specific Gravity, Urine: 1.028 (ref 1.005–1.030)
pH: 6 (ref 5.0–8.0)

## 2015-08-10 LAB — LIPASE, BLOOD: Lipase: 40 U/L (ref 11–51)

## 2015-08-10 LAB — PREGNANCY, URINE: Preg Test, Ur: NEGATIVE

## 2015-08-10 MED ORDER — IOHEXOL 300 MG/ML  SOLN
100.0000 mL | Freq: Once | INTRAMUSCULAR | Status: AC | PRN
  Administered 2015-08-10: 100 mL via INTRAVENOUS

## 2015-08-10 MED ORDER — IOHEXOL 300 MG/ML  SOLN
25.0000 mL | Freq: Once | INTRAMUSCULAR | Status: AC | PRN
  Administered 2015-08-10: 25 mL via ORAL

## 2015-08-10 NOTE — Discharge Instructions (Signed)

## 2015-08-10 NOTE — ED Provider Notes (Signed)
CSN: [id number]     Arrival date & time 08/10/15  1714 History   First MD Initiated Contact with Patient 08/10/15 1905     Chief Complaint  Patient presents with  . Abdominal Pain  . Back Pain     (Consider location/radiation/quality/duration/timing/severity/associated sxs/prior Treatment) Patient is a 29 y.o. female presenting with abdominal pain and back pain. The history is provided by the patient.  Abdominal Pain Associated symptoms: no chest pain, no diarrhea, no nausea, no shortness of breath and no vomiting   Back Pain Associated symptoms: abdominal pain   Associated symptoms: no chest pain, no headaches, no numbness and no weakness    patient presents with pain on her right side. Began around 5 days ago. States she cannot lay on that side. No change in eating. No nausea vomiting or diarrhea. No dysuria. No fevers. States the pain is been constant. No change in appetite. No change in bowel habits. No vaginal bleeding or discharge. States she is on the depo shot so she does not have regular menses. The pain is dull and sharp. It began in the same location that it currently is.  Past Medical History  Diagnosis Date  . Hypertension    History reviewed. No pertinent past surgical history. History reviewed. No pertinent family history. Social History  Substance Use Topics  . Smoking status: Current Every Day Smoker -- 0.25 packs/day for 0 years    Types: Cigarettes  . Smokeless tobacco: None  . Alcohol Use: Yes     Comment: occ   OB History    No data available     Review of Systems  Constitutional: Negative for activity change and appetite change.  Eyes: Negative for pain.  Respiratory: Negative for chest tightness and shortness of breath.   Cardiovascular: Negative for chest pain and leg swelling.  Gastrointestinal: Positive for abdominal pain. Negative for nausea, vomiting and diarrhea.  Genitourinary: Positive for flank pain.  Musculoskeletal: Positive for back pain.  Negative for neck stiffness.  Skin: Negative for rash.  Neurological: Negative for weakness, numbness and headaches.  Psychiatric/Behavioral: Negative for behavioral problems.      Allergies  Review of patient's allergies indicates no known allergies.  Home Medications   Prior to Admission medications   Not on File   BP 142/88 mmHg  Pulse 71  Temp(Src) 98.1 F (36.7 C) (Oral)  Resp 18  SpO2 100%  LMP  Physical Exam  Constitutional:  Patient is obese  HENT:  Head: Atraumatic.  Neck: Neck supple.  Cardiovascular: Normal rate.   Pulmonary/Chest: Effort normal.  Abdominal: Soft. She exhibits no mass. There is tenderness. There is no rebound and no guarding.  Mild right lower quadrant tenderness. CVA tenderness on right side.  Musculoskeletal: Normal range of motion.  Neurological: She is alert.  Skin: Skin is warm.    ED Course  Procedures (including critical care time) Labs Review Labs Reviewed  COMPREHENSIVE METABOLIC PANEL - Abnormal; Notable for the following:    Total Protein 8.2 (*)    All other components within normal limits  CBC - Abnormal; Notable for the following:    WBC 14.6 (*)    Platelets 463 (*)    All other components within normal limits  LIPASE, BLOOD  URINALYSIS, ROUTINE W REFLEX MICROSCOPIC (NOT AT [facility])  PREGNANCY, URINE    Imaging Review Ct Abdomen Pelvis W Contrast  08/10/2015  CLINICAL DATA:  Right lower quadrant pain for 5 days radiating to the back. EXAM: CT  ABDOMEN AND PELVIS WITH CONTRAST TECHNIQUE: Multidetector CT imaging of the abdomen and pelvis was performed using the standard protocol following bolus administration of intravenous contrast. CONTRAST:  25mL OMNIPAQUE IOHEXOL 300 MG/ML SOLN, 100mL OMNIPAQUE IOHEXOL 300 MG/ML SOLN COMPARISON:  None. FINDINGS: Lower chest:  The included lung bases are clear. Liver: No focal lesion. Hepatobiliary: Gallbladder filled with multiple stones. No evident wall thickening or pericholecystic  inflammation. No biliary dilatation or calcified choledocholithiasis. Pancreas: Normal. Spleen: Normal. Adrenal glands: No nodule. Kidneys: Symmetric renal enhancement. No hydronephrosis. No perinephric stranding or localizing renal abnormality. Stomach/Bowel: Stomach physiologically distended. There are no dilated or thickened small bowel loops. Small volume of stool throughout the colon without colonic wall thickening. Colonic diverticulosis involving the descending and sigmoid colon without diverticulitis. Terminal ileum is normal. The appendix is normal. Vascular/Lymphatic: No retroperitoneal adenopathy. Abdominal aorta is normal in caliber. Reproductive: Ovaries symmetric in size. Uterus unremarkable. No adnexal mass. Bladder: Minimally distended, no wall thickening. Other: No free air, free fluid, or intra-abdominal fluid collection. Small fat containing umbilical hernia. Musculoskeletal: There are no acute or suspicious osseous abnormalities. IMPRESSION: 1. No acute abnormality in the abdomen/pelvis. 2. Cholelithiasis.  No CT findings of acute cholecystitis. 3. Diverticulosis of the distal colon without diverticulitis. Electronically Signed   By: Rubye OaksMelanie  Ehinger M.D.   On: 08/10/2015 21:52   I have personally reviewed and evaluated these images and lab results as part of my medical decision-making.   EKG Interpretation None      MDM   Final diagnoses:  Biliary colic    Patient with right-sided pain. Lab work reassuring but mildly elevated white count and cholelithiasis on CT. Mild tenderness. Will have follow-up with general surgery.    Benjiman CoreNathan Jayr Lupercio, MD 08/11/15 [phone]

## 2015-08-10 NOTE — Progress Notes (Signed)
EDCM spoke to patient at bedside. Patient confirms she does not have a pcp or [place].  [facility] informed patient of services there.  EDCM also provided patient with list of pcps who accept self pay patients, list of discount pharmacies and websites needymeds.org and GoodRX.com for medication assistance, phone number to inquire about the orange card, phone number to inquire about Mediciad, phone number to inquire about the Affordable Care Act, financial resources in the community such as local churches, [employer], urban ministries, and dental assistance for uninsured patients.  Patient thankful for resources.  Patient reports she goes to family planning and they have referred her to a pcp in the past.  No further EDCM needs at this time.

## 2015-08-10 NOTE — ED Notes (Signed)
Pt states she's been having RLQ pain since Monday with radiation into back. "Feels like there's something in there when I sit down, and it hurts all the time." Denies any difficulty/changes with urination, emesis, nausea, fever/chills.

## 2015-08-31 ENCOUNTER — Emergency Department (HOSPITAL_COMMUNITY): Payer: Medicaid Other

## 2015-08-31 ENCOUNTER — Emergency Department (HOSPITAL_COMMUNITY)
Admission: EM | Admit: 2015-08-31 | Discharge: 2015-08-31 | Disposition: A | Discharge status: Home / Self Care | Payer: Medicaid Other | Attending: Emergency Medicine | Admitting: Emergency Medicine

## 2015-08-31 ENCOUNTER — Encounter (HOSPITAL_COMMUNITY): Payer: Self-pay | Admitting: Emergency Medicine

## 2015-08-31 DIAGNOSIS — I10 Essential (primary) hypertension: Secondary | ICD-10-CM | POA: Insufficient documentation

## 2015-08-31 DIAGNOSIS — F17210 Nicotine dependence, cigarettes, uncomplicated: Secondary | ICD-10-CM | POA: Insufficient documentation

## 2015-08-31 DIAGNOSIS — R1011 Right upper quadrant pain: Secondary | ICD-10-CM

## 2015-08-31 DIAGNOSIS — K8070 Calculus of gallbladder and bile duct without cholecystitis without obstruction: Secondary | ICD-10-CM | POA: Insufficient documentation

## 2015-08-31 LAB — COMPREHENSIVE METABOLIC PANEL
ALT: 16 U/L (ref 14–54)
AST: 13 U/L — AB (ref 15–41)
Albumin: 3.7 g/dL (ref 3.5–5.0)
Alkaline Phosphatase: 68 U/L (ref 38–126)
Anion gap: 10 (ref 5–15)
BILIRUBIN TOTAL: 0.5 mg/dL (ref 0.3–1.2)
BUN: 18 mg/dL (ref 6–20)
CO2: 22 mmol/L (ref 22–32)
Calcium: 9 mg/dL (ref 8.9–10.3)
Chloride: 107 mmol/L (ref 101–111)
Creatinine, Ser: 0.76 mg/dL (ref 0.44–1.00)
GFR calc Af Amer: >60 mL/min (ref >60)
GFR calc non Af Amer: >60 mL/min (ref >60)
Glucose, Bld: 126 mg/dL — ABNORMAL HIGH (ref 65–99)
POTASSIUM: 3.8 mmol/L (ref 3.5–5.1)
Sodium: 139 mmol/L (ref 135–145)
TOTAL PROTEIN: 7.3 g/dL (ref 6.5–8.1)

## 2015-08-31 LAB — URINALYSIS, ROUTINE W REFLEX MICROSCOPIC
Bilirubin Urine: NEGATIVE
GLUCOSE, UA: NEGATIVE mg/dL
HGB URINE DIPSTICK: NEGATIVE
KETONES UR: NEGATIVE mg/dL
Leukocytes, UA: NEGATIVE
NITRITE: NEGATIVE
PH: 6 (ref 5.0–8.0)
Protein, ur: NEGATIVE mg/dL
Specific Gravity, Urine: 1.02 (ref 1.005–1.030)

## 2015-08-31 LAB — CBC
HCT: 36.4 % (ref 36.0–46.0)
Hemoglobin: 12.1 g/dL (ref 12.0–15.0)
MCH: 29.3 pg (ref 26.0–34.0)
MCHC: 33.2 g/dL (ref 30.0–36.0)
MCV: 88.1 fL (ref 78.0–100.0)
Platelets: 403 10*3/uL — ABNORMAL HIGH (ref 150–400)
RBC: 4.13 MIL/uL (ref 3.87–5.11)
RDW: 13.1 % (ref 11.5–15.5)
WBC: 15.8 10*3/uL — ABNORMAL HIGH (ref 4.0–10.5)

## 2015-08-31 LAB — LIPASE, BLOOD: Lipase: 45 U/L (ref 11–51)

## 2015-08-31 MED ORDER — MORPHINE SULFATE (PF) 4 MG/ML IV SOLN
4.0000 mg | Freq: Once | INTRAVENOUS | Status: AC
  Administered 2015-08-31: 4 mg via INTRAVENOUS
  Filled 2015-08-31: qty 1

## 2015-08-31 MED ORDER — ONDANSETRON HCL 4 MG/2ML IJ SOLN
4.0000 mg | Freq: Once | INTRAMUSCULAR | Status: AC
  Administered 2015-08-31: 4 mg via INTRAVENOUS
  Filled 2015-08-31: qty 2

## 2015-08-31 MED ORDER — OXYCODONE-ACETAMINOPHEN 5-325 MG PO TABS: 1.0000 | ORAL_TABLET | Freq: Four times a day (QID) | ORAL | Status: DC | PRN

## 2015-08-31 MED ORDER — SODIUM CHLORIDE 0.9 % IV BOLUS (SEPSIS): 1000.0000 mL | Freq: Once | INTRAVENOUS | Status: DC

## 2015-08-31 NOTE — ED Notes (Signed)
Patient presents for RUQ abdominal pain, diagnosed earlier this month with gallstones, followed up with surgery, patient unable to make appt until January, no relief of pain at home. Denies N/V, fever or chills. Reports loose stools after eating. Rates pain 9/10.

## 2015-08-31 NOTE — Discharge Instructions (Signed)
Take motrin for pain.  Take percocet for severe pain.   Stay hydrated.   See surgery for follow up as scheduled.   Return to ER if you have severe pain, fever, vomiting, dehydration.

## 2015-08-31 NOTE — Progress Notes (Signed)
This EDCM has seen this patient on 12/09 and provided resources for community resources, pcp and medication assistance.  Patient reports she has not found a pcp, "I'm waiting for my Medicaid to kick in."  Patient reports her Medicaid should have been processed today.  [facility] with same resources and encouraged patient to find pcp to follow up with.  Patient verbalized understanding.  No further EDCM needs at this time.

## 2015-08-31 NOTE — ED Notes (Signed)
Primary nurse Sedonia SmallNatashia request this nurse to start IV; attempt twice unsuccessful.

## 2015-08-31 NOTE — ED Provider Notes (Signed)
CSN: [id number]     Arrival date & time 08/31/15  1309 History   First MD Initiated Contact with Patient 08/31/15 1629     Chief Complaint  Patient presents with  . Abdominal Pain     (Consider location/radiation/quality/duration/timing/severity/associated sxs/prior Treatment) The history is provided by the patient.  Dorothy Haynes is a 29 y.o. female hx of HTN, gallstones here with RUQ pain. Epigastric and right upper quadrant pain that is constant for the last 3 weeks. She actually came to the ER about 3 weeks ago and had a CT abdomen pelvis that showed cholelithiasis with no cholecystitis. She was presents to see surgery today but she doesn't have insurance so she is currently applying for Medicaid and has an appointment at the end of January. He says she has constant pain for the last 3 weeks that is worse with eating. Denies any fever or vomiting but is nauseated. She has a depo shot. Denies urinary symptoms.    Past Medical History  Diagnosis Date  . Hypertension   . Gallstones    History reviewed. No pertinent past surgical history. No family history on file. Social History  Substance Use Topics  . Smoking status: Current Every Day Smoker -- 0.25 packs/day for 0 years    Types: Cigarettes  . Smokeless tobacco: None  . Alcohol Use: Yes     Comment: occ   OB History    No data available     Review of Systems  Gastrointestinal: Positive for abdominal pain.  All other systems reviewed and are negative.     Allergies  Review of patient's allergies indicates no known allergies.  Home Medications   Prior to Admission medications   Medication Sig Start Date End Date Taking? Authorizing Provider  HYDROcodone-acetaminophen (NORCO/VICODIN) 5-325 MG tablet Take 1 tablet by mouth every 6 (six) hours as needed for moderate pain or severe pain.   Yes Historical Provider, MD  meloxicam (MOBIC) 15 MG tablet Take 15 mg by mouth daily as needed for pain.   Yes Historical  Provider, MD   BP 144/99 mmHg  Pulse 87  Temp(Src) 98.6 F (37 C) (Oral)  Resp 22  SpO2 100% Physical Exam  Constitutional: She is oriented to person, place, and time.  Uncomfortable, overweight   HENT:  Head: Normocephalic.  Mouth/Throat: Oropharynx is clear and moist.  Eyes: Conjunctivae are normal. Pupils are equal, round, and reactive to light.  Neck: Normal range of motion. Neck supple.  Cardiovascular: Normal rate, regular rhythm and normal heart sounds.   Pulmonary/Chest: Effort normal and breath sounds normal. No respiratory distress. She has no wheezes. She has no rales.  Abdominal: Soft.  Overweight. + RUQ tenderness, ? Mild murphy sign   Musculoskeletal: Normal range of motion. She exhibits no edema or tenderness.  Neurological: She is alert and oriented to person, place, and time. No cranial nerve deficit. Coordination normal.  Skin: Skin is warm and dry.  Psychiatric: She has a normal mood and affect. Her behavior is normal. Judgment and thought content normal.  Nursing note and vitals reviewed.   ED Course  Procedures (including critical care time) Labs Review Labs Reviewed  COMPREHENSIVE METABOLIC PANEL - Abnormal; Notable for the following:    Glucose, Bld 126 (*)    AST 13 (*)    All other components within normal limits  CBC - Abnormal; Notable for the following:    WBC 15.8 (*)    Platelets 403 (*)    All other  components within normal limits  URINALYSIS, ROUTINE W REFLEX MICROSCOPIC (NOT AT [facility]) - Abnormal; Notable for the following:    APPearance HAZY (*)    All other components within normal limits  LIPASE, BLOOD    Imaging Review US Abdomen Limited Ruq  08/31/2015  CLINICAL DATA:  Right upper quadrant pain for 3 weeks. EXAM: US ABDOMEN LIMITED - RIGHT UPPER QUADRANT COMPARISON:  CT on 08/10/2015 FINDINGS: Gallbladder: Wall echo shadow complex is seen in the gallbladder fossa, consistent with stones filling the gallbladder. This limits evaluation  of the gallbladder wall, however there is no definite evidence of wall thickening or pericholecystic fluid. No sonographic Murphy sign noted by sonographer. Common bile duct: Diameter: 3 mm Liver: No focal lesion identified. Within normal limits in parenchymal echogenicity. IMPRESSION: Gallstones filling the gallbladder. This limits visualization of gallbladder wall. No evidence of biliary ductal dilatation. Electronically Signed   By: Myles Rosenthal M.D.   On: 08/31/2015 17:16   I have personally reviewed and evaluated these images and lab results as part of my medical decision-making.   EKG Interpretation None      MDM   Final diagnoses:  RUQ abdominal pain    Dorothy Haynes is a 29 y.o. female here with RUQ pain. Consider symptomatic chole vs cholecystitis. Will get RUQ [place] to assess and get labs. Will give pain meds and reassess.   7:24 PM US showed gallstones but no obvious acute chole. WBC 15 but was 14 a month ago. Pain controlled with morphine. Tolerated PO in the ED and wants to go home. Will dc home with percocet. Has f/u with surgery in a month.   Richardean Canal, MD 08/31/15 [phone]

## 2015-09-25 ENCOUNTER — Other Ambulatory Visit: Payer: Self-pay | Admitting: General Surgery

## 2015-10-03 DIAGNOSIS — Z8719 Personal history of other diseases of the digestive system: Secondary | ICD-10-CM

## 2015-10-03 HISTORY — DX: Personal history of other diseases of the digestive system: Z87.19

## 2015-10-07 NOTE — Patient Instructions (Signed)
Dorothy Haynes  10/07/2015   Your procedure is scheduled on: October 09, 2015  Report to [facility] Main  Entrance take [employer]  elevators to 3rd floor to  Short Stay Center at 9:25 AM.  Call this number if you have problems the morning of surgery [phone]   Remember: ONLY 1 PERSON MAY GO WITH YOU TO SHORT STAY TO GET  READY MORNING OF YOUR SURGERY.  Do not eat food or drink liquids :After Midnight.     Take these medicines the morning of surgery with A SIP OF WATER: Hydrocodone if needed DO NOT TAKE ANY DIABETIC MEDICATIONS DAY OF YOUR SURGERY                               You may not have any metal on your body including hair pins and              piercings  Do not wear jewelry, make-up, lotions, powders or perfumes, deodorant             Do not wear nail polish.  Do not shave  48 hours prior to surgery.     Do not bring valuables to the hospital. [place] [facility] IS NOT             RESPONSIBLE   FOR VALUABLES.  Contacts, dentures or bridgework may not be worn into surgery.      Patients discharged the day of surgery will not be allowed to drive home.  Name and phone number of your driver:  Special Instructions: coughing and deep breathing exercises, leg exercises              Please read over the following fact sheets you were given: _____________________________________________________________________             [facility] - Preparing for Surgery Before surgery, you can play an important role.  Because skin is not sterile, your skin needs to be as free of germs as possible.  You can reduce the number of germs on your skin by washing with CHG (chlorahexidine gluconate) soap before surgery.  CHG is an antiseptic cleaner which kills germs and bonds with the skin to continue killing germs even after washing. Please DO NOT use if you have an allergy to CHG or antibacterial soaps.  If your skin becomes reddened/irritated stop using the CHG and inform your  nurse when you arrive at Short Stay. Do not shave (including legs and underarms) for at least 48 hours prior to the first CHG shower.  You may shave your face/neck. Please follow these instructions carefully:  1.  Shower with CHG Soap the night before surgery and the  morning of Surgery.  2.  If you choose to wash your hair, wash your hair first as usual with your  normal  shampoo.  3.  After you shampoo, rinse your hair and body thoroughly to remove the  shampoo.                           4.  Use CHG as you would any other liquid soap.  You can apply chg directly  to the skin and wash                       Gently  with a scrungie or clean washcloth.  5.  Apply the CHG Soap to your body ONLY FROM THE NECK DOWN.   Do not use on face/ open                           Wound or open sores. Avoid contact with eyes, ears mouth and genitals (private parts).                       Wash face,  Genitals (private parts) with your normal soap.             6.  Wash thoroughly, paying special attention to the area where your surgery  will be performed.  7.  Thoroughly rinse your body with warm water from the neck down.  8.  DO NOT shower/wash with your normal soap after using and rinsing off  the CHG Soap.                9.  Pat yourself dry with a clean towel.            10.  Wear clean pajamas.            11.  Place clean sheets on your bed the night of your first shower and do not  sleep with pets. Day of Surgery : Do not apply any lotions/deodorants the morning of surgery.  Please wear clean clothes to the hospital/surgery center.  FAILURE TO FOLLOW THESE INSTRUCTIONS MAY RESULT IN THE CANCELLATION OF YOUR SURGERY PATIENT SIGNATURE_________________________________  NURSE SIGNATURE__________________________________  ________________________________________________________________________

## 2015-10-08 ENCOUNTER — Encounter (INDEPENDENT_AMBULATORY_CARE_PROVIDER_SITE_OTHER): Payer: Self-pay

## 2015-10-08 ENCOUNTER — Encounter (HOSPITAL_COMMUNITY): Payer: Self-pay

## 2015-10-08 ENCOUNTER — Encounter (HOSPITAL_COMMUNITY)
Admission: RE | Admit: 2015-10-08 | Discharge: 2015-10-08 | Disposition: A | Discharge status: Home / Self Care | Payer: Medicaid Other | Source: Ambulatory Visit | Attending: General Surgery | Admitting: General Surgery

## 2015-10-08 DIAGNOSIS — K8020 Calculus of gallbladder without cholecystitis without obstruction: Secondary | ICD-10-CM | POA: Diagnosis not present

## 2015-10-08 DIAGNOSIS — Z01812 Encounter for preprocedural laboratory examination: Secondary | ICD-10-CM | POA: Diagnosis present

## 2015-10-08 HISTORY — DX: Reserved for inherently not codable concepts without codable children: IMO0001

## 2015-10-08 HISTORY — DX: Headache: R51

## 2015-10-08 HISTORY — DX: Gastro-esophageal reflux disease without esophagitis: K21.9

## 2015-10-08 HISTORY — DX: Gestational diabetes mellitus in pregnancy, unspecified control: O24.419

## 2015-10-08 HISTORY — DX: Headache, unspecified: R51.9

## 2015-10-08 LAB — COMPREHENSIVE METABOLIC PANEL
ALT: 20 U/L (ref 14–54)
AST: 22 U/L (ref 15–41)
Albumin: 4 g/dL (ref 3.5–5.0)
Alkaline Phosphatase: 62 U/L (ref 38–126)
Anion gap: 10 (ref 5–15)
BILIRUBIN TOTAL: 0.5 mg/dL (ref 0.3–1.2)
BUN: 16 mg/dL (ref 6–20)
CO2: 26 mmol/L (ref 22–32)
CREATININE: 0.67 mg/dL (ref 0.44–1.00)
Calcium: 9.3 mg/dL (ref 8.9–10.3)
Chloride: 107 mmol/L (ref 101–111)
Glucose, Bld: 128 mg/dL — ABNORMAL HIGH (ref 65–99)
POTASSIUM: 4.1 mmol/L (ref 3.5–5.1)
Sodium: 143 mmol/L (ref 135–145)
TOTAL PROTEIN: 7.5 g/dL (ref 6.5–8.1)

## 2015-10-08 LAB — CBC WITH DIFFERENTIAL/PLATELET
BASOS ABS: 0 10*3/uL (ref 0.0–0.1)
Basophils Relative: 0 %
Eosinophils Absolute: 0.3 10*3/uL (ref 0.0–0.7)
Eosinophils Relative: 2 %
HEMATOCRIT: 37.5 % (ref 36.0–46.0)
Hemoglobin: 12.4 g/dL (ref 12.0–15.0)
LYMPHS ABS: 5.9 10*3/uL — AB (ref 0.7–4.0)
LYMPHS PCT: 36 %
MCH: 28.8 pg (ref 26.0–34.0)
MCHC: 33.1 g/dL (ref 30.0–36.0)
MCV: 87.2 fL (ref 78.0–100.0)
MONO ABS: 0.6 10*3/uL (ref 0.1–1.0)
MONOS PCT: 4 %
NEUTROS ABS: 9.4 10*3/uL — AB (ref 1.7–7.7)
Neutrophils Relative %: 58 %
Platelets: 413 10*3/uL — ABNORMAL HIGH (ref 150–400)
RBC: 4.3 MIL/uL (ref 3.87–5.11)
RDW: 12.9 % (ref 11.5–15.5)
WBC: 16.3 10*3/uL — ABNORMAL HIGH (ref 4.0–10.5)

## 2015-10-08 LAB — HCG, SERUM, QUALITATIVE: PREG SERUM: NEGATIVE

## 2015-10-08 MED ORDER — DEXTROSE 5 % IV SOLN
3.0000 g | INTRAVENOUS | Status: AC
  Administered 2015-10-09: 3 g via INTRAVENOUS
  Filled 2015-10-08: qty 3000

## 2015-10-08 NOTE — Progress Notes (Signed)
08-31-15 - ED visit [facility]) - EPIC 08-31-15 - U/S ABD - EPIC 08-31-15 - UA - EPIC 08-10-15 - CT ABD/Pelvis - EPIC 02-20-15- 2V CXR - EPIC

## 2015-10-08 NOTE — Progress Notes (Signed)
10-08-15 - CBC w/diff lab results from preop visit faxed to Dr. Derrell Lolling via [facility]

## 2015-10-08 NOTE — H&P (Signed)
Dorothy Haynes  Location: [facility] Patient #: [id number] DOB: Jul 31, 1986 Single / Language: [place] / Race: Black or African American Female        History of Present Illness   The patient is a 30 year old female who presents for evaluation of gall stones. This is a 30 year old African-American female, referred by Dr. Kinnie Scales in the emergency department for evaluation of symptomatic gallstones. She does not currently have a PCP.  She says that she has a several month history of intermittent attacks of right upper quadrant pain and nausea. Alternating diarrhea and normal bowel movements. No vomiting. No fever. She says that she feels uncomfortable almost every day but there are some days that are worse than others. She was seen in the emergency department on August 31, 2015 at which time ultrasound showed a gallbladder filled with gallstones but no inflammatory changes or bile duct dilatation. She had been previously seen in the ED on December 9 and a CT scan showed gallstones and was otherwise normal. No history of jaundice or liver disease. No history of pulmonary or cardiovascular disease. She smokes half a pack of cigarettes per day. Has had one pregnancy and one vaginal delivery. No prior abdominal surgery. Borderline hypertension. Normotensive today. Father died of head and neck cancer. Mother living with diabetes and hypertension. She delayed her presentation to our office because she applied for Medicaid. She says this has been approved and she is ready to go ahead with gallbladder surgery.  We discussed the indications, details, techniques, and numerous risk of cholecystectomy with her. She's aware of the risk of bleeding, infection, conversion to open laparotomy, postop bile leak and readmission, wound hernia, conversion to open laparotomy, injury to adjacent organs with major reconstructive surgery, cardiac, pulmonary, and  thromboembolic problems. She understands all these issues well. This time all of her questions were answered. She agrees with this plan. I reviewed the patient education booklet with her in detail.   Other Problems  Back Pain Diabetes Mellitus High blood pressure  Past Surgical History  No pertinent past surgical history  Diagnostic Studies History  Colonoscopy never Pap Smear 1-5 years ago  Allergies  No Known Drug Allergies  Medication History  Oxycodone-Acetaminophen (5-325MG  Tablet, Oral as needed) Active. Medications Reconciled  Social History  Alcohol use Moderate alcohol use. Caffeine use Carbonated beverages, Coffee, Tea. Illicit drug use Remotely quit drug use. Tobacco use Current every day smoker.  Family History  Arthritis Mother. Cancer Father. Diabetes Mellitus Family Members In General, Mother. Heart disease in female family member before age 62 Hypertension Family Members In General, Mother.  Pregnancy / Birth History  Age at menarche 7 years. Gravida 1 Irregular periods Maternal age 65-25 Para 1    Review of Systems  General Present- Chills and Night Sweats. Not Present- Appetite Loss, Fatigue, Fever, Weight Gain and Weight Loss. Skin Present- Hives. Not Present- Change in Wart/Mole, Dryness, Jaundice, New Lesions, Non-Healing Wounds, Rash and Ulcer. HEENT Not Present- Earache, Hearing Loss, Hoarseness, Nose Bleed, Oral Ulcers, Ringing in the Ears, Seasonal Allergies, Sinus Pain, Sore Throat, Visual Disturbances, Wears glasses/contact lenses and Yellow Eyes. Respiratory Present- Wheezing. Not Present- Bloody sputum, Chronic Cough, Difficulty Breathing and Snoring. Cardiovascular Present- Difficulty Breathing Lying Down. Not Present- Chest Pain, Leg Cramps, Palpitations, Rapid Heart Rate, Shortness of Breath and Swelling of Extremities. Gastrointestinal Present- Abdominal Pain and Excessive gas. Not Present- Bloating, Bloody  Stool, Change in Bowel Habits, Chronic diarrhea, Constipation, Difficulty Swallowing, Gets full  quickly at meals, Hemorrhoids, Indigestion, Nausea, Rectal Pain and Vomiting. Female Genitourinary Present- Pelvic Pain. Not Present- Frequency, Nocturia, Painful Urination and Urgency. Musculoskeletal Present- Back Pain and Muscle Pain. Not Present- Joint Pain, Joint Stiffness, Muscle Weakness and Swelling of Extremities. Neurological Not Present- Decreased Memory, Fainting, Headaches, Numbness, Seizures, Tingling, Tremor, Trouble walking and Weakness. Psychiatric Not Present- Anxiety, Bipolar, Change in Sleep Pattern, Depression, Fearful and Frequent crying. Endocrine Present- Hot flashes. Not Present- Cold Intolerance, Excessive Hunger, Hair Changes, Heat Intolerance and New Diabetes. Hematology Not Present- Easy Bruising, Excessive bleeding, Gland problems, HIV and Persistent Infections.  Vitals  Weight: 270 lb Height: 63in Body Surface Area: 2.2 m Body Mass Index: 47.83 kg/m  Temp.: 97.33F(Temporal)  Pulse: 97 (Regular)  BP: 130/72 (Sitting, Left Arm, Standard)       Physical Exam General Mental Status-Alert. General Appearance-Consistent with stated age. Hydration-Well hydrated. Voice-Normal.  Head and Neck Head-normocephalic, atraumatic with no lesions or palpable masses. Trachea-midline. Thyroid Gland Characteristics - normal size and consistency.  Eye Eyeball - Bilateral-Extraocular movements intact. Sclera/Conjunctiva - Bilateral-No scleral icterus.  Chest and Lung Exam Chest and lung exam reveals -quiet, even and easy respiratory effort with no use of accessory muscles and on auscultation, normal breath sounds, no adventitious sounds and normal vocal resonance. Inspection Chest Wall - Normal. Back - normal.  Cardiovascular Cardiovascular examination reveals -normal heart sounds, regular rate and rhythm with no murmurs and normal pedal  pulses bilaterally.  Abdomen Inspection Inspection of the abdomen reveals - No Hernias. Skin - Scar - no surgical scars. Palpation/Percussion Palpation and Percussion of the abdomen reveal - Soft, Non Tender, No Rebound tenderness, No Rigidity (guarding) and No hepatosplenomegaly. Auscultation Auscultation of the abdomen reveals - Bowel sounds normal. Note: Morbidly obese. Not distended. Soft and nontender. No hernias noted.   Neurologic Neurologic evaluation reveals -alert and oriented x 3 with no impairment of recent or remote memory. Mental Status-Normal.  Musculoskeletal Normal Exam - Left-Upper Extremity Strength Normal and Lower Extremity Strength Normal. Normal Exam - Right-Upper Extremity Strength Normal and Lower Extremity Strength Normal.  Lymphatic Head & Neck  General Head & Neck Lymphatics: Bilateral - Description - Normal. Axillary  General Axillary Region: Bilateral - Description - Normal. Tenderness - Non Tender. Femoral & Inguinal  Generalized Femoral & Inguinal Lymphatics: Bilateral - Description - Normal. Tenderness - Non Tender.    Assessment & Plan  GALLSTONES (K80.20) .   Your episodes of right-sided abdominal pain and nausea are almost certainly due to your multiple gallstones. We see the multiple gallstones on the CAT scan and on the ultrasound, but we do not see any other abnormal problems in your abdomen. We have discussed indications, techniques, and numerous risk of gallbladder surgery. You have agreed to proceed with this. I advised a low-fat diet immediately You will be scheduled for laparoscopic cholecystectomy, possible open cholecystectomy in the near future Please read the patient information booklet that I gave you  BMI 45.0 - 49.9 (Z68.42) TOBACCO ABUSE (Z72.0)    Angelia Mould. Derrell Lolling, M.D., [facility] General and Minimally invasive Surgery Breast and Colorectal Surgery Office:    [phone] Pager:   [phone]

## 2015-10-09 ENCOUNTER — Encounter (HOSPITAL_COMMUNITY): Payer: Self-pay | Admitting: *Deleted

## 2015-10-09 ENCOUNTER — Encounter (HOSPITAL_COMMUNITY)
Admission: RE | Disposition: A | Discharge status: Home / Self Care | Payer: Self-pay | Source: Ambulatory Visit | Attending: General Surgery

## 2015-10-09 ENCOUNTER — Ambulatory Visit (HOSPITAL_COMMUNITY)
Admission: RE | Admit: 2015-10-09 | Discharge: 2015-10-09 | Disposition: A | Discharge status: Home / Self Care | Payer: Medicaid Other | Source: Ambulatory Visit | Attending: General Surgery | Admitting: General Surgery

## 2015-10-09 ENCOUNTER — Ambulatory Visit (HOSPITAL_COMMUNITY): Payer: Medicaid Other | Admitting: Anesthesiology

## 2015-10-09 DIAGNOSIS — F17210 Nicotine dependence, cigarettes, uncomplicated: Secondary | ICD-10-CM | POA: Diagnosis not present

## 2015-10-09 DIAGNOSIS — Z6841 Body Mass Index (BMI) 40.0 and over, adult: Secondary | ICD-10-CM | POA: Diagnosis not present

## 2015-10-09 DIAGNOSIS — K8010 Calculus of gallbladder with chronic cholecystitis without obstruction: Secondary | ICD-10-CM | POA: Diagnosis not present

## 2015-10-09 DIAGNOSIS — Z720 Tobacco use: Secondary | ICD-10-CM

## 2015-10-09 DIAGNOSIS — E119 Type 2 diabetes mellitus without complications: Secondary | ICD-10-CM | POA: Insufficient documentation

## 2015-10-09 DIAGNOSIS — K8020 Calculus of gallbladder without cholecystitis without obstruction: Secondary | ICD-10-CM | POA: Diagnosis present

## 2015-10-09 DIAGNOSIS — I10 Essential (primary) hypertension: Secondary | ICD-10-CM | POA: Insufficient documentation

## 2015-10-09 HISTORY — DX: Morbid (severe) obesity due to excess calories: E66.01

## 2015-10-09 HISTORY — DX: Tobacco use: Z72.0

## 2015-10-09 HISTORY — PX: CHOLECYSTECTOMY: SHX55

## 2015-10-09 LAB — HEMOGLOBIN A1C
HEMOGLOBIN A1C: 6.6 % — AB (ref 4.8–5.6)
Mean Plasma Glucose: 143 mg/dL

## 2015-10-09 LAB — GLUCOSE, CAPILLARY: Glucose-Capillary: 98 mg/dL (ref 65–99)

## 2015-10-09 SURGERY — LAPAROSCOPIC CHOLECYSTECTOMY WITH INTRAOPERATIVE CHOLANGIOGRAM
Anesthesia: General | Site: Abdomen

## 2015-10-09 MED ORDER — NEOSTIGMINE METHYLSULFATE 10 MG/10ML IV SOLN
INTRAVENOUS | Status: DC | PRN
  Administered 2015-10-09: 5 mg via INTRAVENOUS

## 2015-10-09 MED ORDER — SODIUM CHLORIDE 0.9 % IV SOLN: INTRAVENOUS | Status: DC

## 2015-10-09 MED ORDER — GLYCOPYRROLATE 0.2 MG/ML IJ SOLN
INTRAMUSCULAR | Status: DC | PRN
  Administered 2015-10-09: 0.6 mg via INTRAVENOUS

## 2015-10-09 MED ORDER — FENTANYL CITRATE (PF) 100 MCG/2ML IJ SOLN
INTRAMUSCULAR | Status: DC | PRN
  Administered 2015-10-09: 50 ug via INTRAVENOUS
  Administered 2015-10-09 (×2): 100 ug via INTRAVENOUS
  Administered 2015-10-09 (×2): 50 ug via INTRAVENOUS

## 2015-10-09 MED ORDER — BUPIVACAINE-EPINEPHRINE 0.5% -1:200000 IJ SOLN
INTRAMUSCULAR | Status: DC | PRN
  Administered 2015-10-09: 23 mL

## 2015-10-09 MED ORDER — CHLORHEXIDINE GLUCONATE 4 % EX LIQD: 1.0000 "application " | Freq: Once | CUTANEOUS | Status: DC

## 2015-10-09 MED ORDER — ROCURONIUM BROMIDE 100 MG/10ML IV SOLN
INTRAVENOUS | Status: DC | PRN
  Administered 2015-10-09: 40 mg via INTRAVENOUS

## 2015-10-09 MED ORDER — ACETAMINOPHEN 325 MG PO TABS: 650.0000 mg | ORAL_TABLET | ORAL | Status: DC | PRN

## 2015-10-09 MED ORDER — LIDOCAINE HCL (CARDIAC) 20 MG/ML IV SOLN
INTRAVENOUS | Status: DC | PRN
  Administered 2015-10-09: 100 mg via INTRAVENOUS

## 2015-10-09 MED ORDER — ONDANSETRON HCL 4 MG/2ML IJ SOLN
INTRAMUSCULAR | Status: AC
  Filled 2015-10-09: qty 2

## 2015-10-09 MED ORDER — FENTANYL CITRATE (PF) 100 MCG/2ML IJ SOLN
25.0000 ug | INTRAMUSCULAR | Status: DC | PRN
Start: 2015-10-09 — End: 2015-10-09

## 2015-10-09 MED ORDER — MIDAZOLAM HCL 5 MG/5ML IJ SOLN
INTRAMUSCULAR | Status: DC | PRN
  Administered 2015-10-09: 2 mg via INTRAVENOUS

## 2015-10-09 MED ORDER — MIDAZOLAM HCL 2 MG/2ML IJ SOLN
INTRAMUSCULAR | Status: AC
  Filled 2015-10-09: qty 2

## 2015-10-09 MED ORDER — LACTATED RINGERS IR SOLN
Status: DC | PRN
  Administered 2015-10-09: 1000 mL

## 2015-10-09 MED ORDER — NEOSTIGMINE METHYLSULFATE 10 MG/10ML IV SOLN
INTRAVENOUS | Status: AC
  Filled 2015-10-09: qty 1

## 2015-10-09 MED ORDER — HYDROMORPHONE HCL 1 MG/ML IJ SOLN
INTRAMUSCULAR | Status: AC
  Filled 2015-10-09: qty 1

## 2015-10-09 MED ORDER — 0.9 % SODIUM CHLORIDE (POUR BTL) OPTIME
TOPICAL | Status: DC | PRN
  Administered 2015-10-09: 1000 mL

## 2015-10-09 MED ORDER — FENTANYL CITRATE (PF) 100 MCG/2ML IJ SOLN
INTRAMUSCULAR | Status: AC
  Filled 2015-10-09: qty 2

## 2015-10-09 MED ORDER — LACTATED RINGERS IV SOLN
INTRAVENOUS | Status: DC | PRN
  Administered 2015-10-09: 10:00:00 via INTRAVENOUS

## 2015-10-09 MED ORDER — ACETAMINOPHEN 650 MG RE SUPP: 650.0000 mg | RECTAL | Status: DC | PRN

## 2015-10-09 MED ORDER — SODIUM CHLORIDE 0.9% FLUSH: 3.0000 mL | INTRAVENOUS | Status: DC | PRN

## 2015-10-09 MED ORDER — SODIUM CHLORIDE 0.9 % IV SOLN: 250.0000 mL | INTRAVENOUS | Status: DC | PRN

## 2015-10-09 MED ORDER — GLYCOPYRROLATE 0.2 MG/ML IJ SOLN
INTRAMUSCULAR | Status: AC
  Filled 2015-10-09: qty 3

## 2015-10-09 MED ORDER — ONDANSETRON HCL 4 MG/2ML IJ SOLN
INTRAMUSCULAR | Status: DC | PRN
  Administered 2015-10-09: 4 mg via INTRAVENOUS

## 2015-10-09 MED ORDER — SUCCINYLCHOLINE CHLORIDE 20 MG/ML IJ SOLN
INTRAMUSCULAR | Status: DC | PRN
  Administered 2015-10-09: 100 mg via INTRAVENOUS

## 2015-10-09 MED ORDER — HYDROCODONE-ACETAMINOPHEN 5-325 MG PO TABS: 1.0000 | ORAL_TABLET | Freq: Four times a day (QID) | ORAL | Status: DC | PRN

## 2015-10-09 MED ORDER — PROPOFOL 10 MG/ML IV BOLUS
INTRAVENOUS | Status: AC
  Filled 2015-10-09: qty 20

## 2015-10-09 MED ORDER — OXYCODONE HCL 5 MG PO TABS
5.0000 mg | ORAL_TABLET | Freq: Once | ORAL | Status: DC | PRN
  Filled 2015-10-09: qty 1

## 2015-10-09 MED ORDER — MEPERIDINE HCL 50 MG/ML IJ SOLN: 6.2500 mg | INTRAMUSCULAR | Status: DC | PRN

## 2015-10-09 MED ORDER — OXYCODONE HCL 5 MG PO TABS
5.0000 mg | ORAL_TABLET | ORAL | Status: DC | PRN
  Administered 2015-10-09: 5 mg via ORAL

## 2015-10-09 MED ORDER — FENTANYL CITRATE (PF) 250 MCG/5ML IJ SOLN
INTRAMUSCULAR | Status: AC
  Filled 2015-10-09: qty 5

## 2015-10-09 MED ORDER — PROPOFOL 10 MG/ML IV BOLUS
INTRAVENOUS | Status: DC | PRN
  Administered 2015-10-09: 250 mg via INTRAVENOUS

## 2015-10-09 MED ORDER — OXYCODONE HCL 5 MG/5ML PO SOLN: 5.0000 mg | Freq: Once | ORAL | Status: DC | PRN

## 2015-10-09 MED ORDER — SODIUM CHLORIDE 0.9% FLUSH: 3.0000 mL | Freq: Two times a day (BID) | INTRAVENOUS | Status: DC

## 2015-10-09 MED ORDER — BUPIVACAINE-EPINEPHRINE (PF) 0.5% -1:200000 IJ SOLN
INTRAMUSCULAR | Status: AC
  Filled 2015-10-09: qty 30

## 2015-10-09 MED ORDER — HYDROMORPHONE HCL 1 MG/ML IJ SOLN
0.2500 mg | INTRAMUSCULAR | Status: DC | PRN
  Administered 2015-10-09 (×3): 0.5 mg via INTRAVENOUS

## 2015-10-09 SURGICAL SUPPLY — 34 items
APL SKNCLS STERI-STRIP NONHPOA (GAUZE/BANDAGES/DRESSINGS)
APPLIER CLIP ROT 10 11.4 M/L (STAPLE) ×3
APR CLP MED LRG 11.4X10 (STAPLE) ×1
BAG SPEC RTRVL LRG 6X4 10 (ENDOMECHANICALS) ×1
BENZOIN TINCTURE PRP APPL 2/3 (GAUZE/BANDAGES/DRESSINGS) IMPLANT
CLIP APPLIE ROT 10 11.4 M/L (STAPLE) ×1 IMPLANT
CLOSURE WOUND 1/2 X4 (GAUZE/BANDAGES/DRESSINGS)
COVER MAYO STAND STRL (DRAPES) ×3 IMPLANT
COVER SURGICAL LIGHT HANDLE (MISCELLANEOUS) ×3 IMPLANT
DECANTER SPIKE VIAL GLASS SM (MISCELLANEOUS) ×3 IMPLANT
DRAPE C-ARM 42X120 X-RAY (DRAPES) ×3 IMPLANT
DRAPE LAPAROSCOPIC ABDOMINAL (DRAPES) ×3 IMPLANT
ELECT REM PT RETURN 9FT ADLT (ELECTROSURGICAL) ×3
ELECTRODE REM PT RTRN 9FT ADLT (ELECTROSURGICAL) ×1 IMPLANT
GLOVE EUDERMIC 7 POWDERFREE (GLOVE) ×3 IMPLANT
GOWN STRL REUS W/TWL XL LVL3 (GOWN DISPOSABLE) ×14 IMPLANT
HEMOSTAT SNOW SURGICEL 2X4 (HEMOSTASIS) ×2 IMPLANT
HOVERMATT SINGLE USE (MISCELLANEOUS) ×2 IMPLANT
KIT BASIN OR (CUSTOM PROCEDURE TRAY) ×3 IMPLANT
LIQUID BAND (GAUZE/BANDAGES/DRESSINGS) ×3 IMPLANT
POUCH SPECIMEN RETRIEVAL 10MM (ENDOMECHANICALS) ×3 IMPLANT
SCISSORS LAP 5X35 DISP (ENDOMECHANICALS) ×3 IMPLANT
SET CHOLANGIOGRAPH MIX (MISCELLANEOUS) ×3 IMPLANT
SET IRRIG TUBING LAPAROSCOPIC (IRRIGATION / IRRIGATOR) ×3 IMPLANT
SLEEVE XCEL OPT CAN 5 100 (ENDOMECHANICALS) ×3 IMPLANT
STRIP CLOSURE SKIN 1/2X4 (GAUZE/BANDAGES/DRESSINGS) IMPLANT
SUT MNCRL AB 4-0 PS2 18 (SUTURE) ×3 IMPLANT
TOWEL OR 17X26 10 PK STRL BLUE (TOWEL DISPOSABLE) ×3 IMPLANT
TOWEL OR NON WOVEN STRL DISP B (DISPOSABLE) ×3 IMPLANT
TRAY LAPAROSCOPIC (CUSTOM PROCEDURE TRAY) ×3 IMPLANT
TROCAR BLADELESS OPT 5 100 (ENDOMECHANICALS) ×3 IMPLANT
TROCAR XCEL BLUNT TIP 100MML (ENDOMECHANICALS) ×3 IMPLANT
TROCAR XCEL NON-BLD 11X100MML (ENDOMECHANICALS) ×3 IMPLANT
TUBING INSUF HEATED (TUBING) ×2 IMPLANT

## 2015-10-09 NOTE — Transfer of Care (Signed)
Immediate Anesthesia Transfer of Care Note  Patient: Dorothy Haynes  Procedure(s) Performed: Procedure(s): LAPAROSCOPIC CHOLECYSTECTOMY  (N/A)  Patient Location: PACU  Anesthesia Type:General  Level of Consciousness: sedated  Airway & Oxygen Therapy: Patient Spontanous Breathing and Patient connected to face mask oxygen  Post-op Assessment: Report given to RN and Post -op Vital signs reviewed and stable  Post vital signs: Reviewed and stable  Last Vitals: There were no vitals filed for this visit.  Complications: No apparent anesthesia complications

## 2015-10-09 NOTE — Anesthesia Procedure Notes (Signed)
Procedure Name: Intubation Date/Time: 10/09/2015 11:30 AM Performed by: Doran Clay Pre-anesthesia Checklist: Patient identified, Emergency Drugs available, Suction available and Patient being monitored Patient Re-evaluated:Patient Re-evaluated prior to inductionOxygen Delivery Method: Circle System Utilized Preoxygenation: Pre-oxygenation with 100% oxygen Intubation Type: IV induction Ventilation: Mask ventilation without difficulty Laryngoscope Size: Mac and 4 Grade View: Grade III Tube type: Oral Tube size: 7.5 mm Number of attempts: 1 Airway Equipment and Method: Stylet and Oral airway Placement Confirmation: ETT inserted through vocal cords under direct vision,  positive ETCO2 and breath sounds checked- equal and bilateral Secured at: 23 cm Tube secured with: Tape Dental Injury: Teeth and Oropharynx as per pre-operative assessment

## 2015-10-09 NOTE — Anesthesia Preprocedure Evaluation (Addendum)
Anesthesia Evaluation  Patient identified by MRN, date of birth, ID band Patient awake    Reviewed: Allergy & Precautions, NPO status , Patient's Chart, lab work & pertinent test results  Airway Mallampati: II  TM Distance: >3 FB Neck ROM: Full    Dental  (+) Teeth Intact, Dental Advisory Given   Pulmonary Current Smoker,    breath sounds clear to auscultation       Cardiovascular hypertension (Pt takes no meds for HTN),  Rhythm:Regular Rate:Normal     Neuro/Psych    GI/Hepatic   Endo/Other  diabetes (Pt seems unaware of DM diagnosis or of elevated A1C), Well Controlled, Type obesity  Renal/GU      Musculoskeletal   Abdominal   Peds  Hematology   Anesthesia Other Findings   Reproductive/Obstetrics                           Anesthesia Physical Anesthesia Plan  ASA: III  Anesthesia Plan: General   Post-op Pain Management:    Induction: Intravenous  Airway Management Planned: Oral ETT  Additional Equipment:   Intra-op Plan:   Post-operative Plan: Extubation in OR  Informed Consent: I have reviewed the patients History and Physical, chart, labs and discussed the procedure including the risks, benefits and alternatives for the proposed anesthesia with the patient or authorized representative who has indicated his/her understanding and acceptance.   Dental advisory given  Plan Discussed with: CRNA, Anesthesiologist and Surgeon  Anesthesia Plan Comments:         Anesthesia Quick Evaluation

## 2015-10-09 NOTE — Op Note (Signed)
Patient Name:           Dorothy Haynes   Date of Surgery:        10/09/2015  Pre op Diagnosis:      Chronic cholecystitis with cholelithiasis  Post op Diagnosis:    Same  Procedure:                 Laparoscopic cholecystectomy  Surgeon:                     Angelia Mould. Derrell Lolling, M.D., FACS  Assistant:                      Feliciana Rossetti, M.D.  Operative Indications:   This is a 30 year old African-American female, referred by  the emergency department for evaluation of symptomatic gallstones. She does not currently have a PCP.  She says that she has a several month history of intermittent attacks of right upper quadrant pain and nausea. Alternating diarrhea and normal bowel movements. No vomiting. No fever. She says that she feels uncomfortable almost every day but there are some days that are worse than others. She was seen in the emergency department on August 31, 2015 at which time ultrasound showed a gallbladder filled with gallstones but no inflammatory changes or bile duct dilatation. She had been previously seen in the ED on December 9 and a CT scan showed gallstones and was otherwise normal.  Liver function tests have been normal on 2 separate draws. No history of jaundice or liver disease. No history of pulmonary or cardiovascular disease. She smokes half a pack of cigarettes per day. Has had one pregnancy and one vaginal delivery. No prior abdominal surgery. Borderline hypertension. Normotensive today. Father died of head and neck cancer. Mother living with diabetes and hypertension.    She is brought to operating room electively for cholecystectomy.   Operative Findings:       The liver was healthy appearing but was quite large.  The gallbladder was relatively thin-walled but there were extensive adhesions to it, suggesting multiple prior inflammatory episodes.  There were 2 stones in the distal gallbladder and proximal cystic duct which I was able to  milk back into the gallbladder.  The distal cystic duct was quite small in caliber.  The stomach, duodenum, small intestine, large intestine were grossly normal to inspection.    Procedure in Detail:          Following the induction of general endotracheal anesthesia the patient's abdomen was prepped and draped in a sterile fashion.  Intravenous antibiotics were given.  Surgical timeout performed.  0.5% Marcaine with epinephrine was used as local infiltration anesthetic.  A vertical incision was made in the lower rim of the umbilicus.  The fascia was incised in the midline and the abdominal cavity entered under direct vision.  An 11 mm Hassan trocar was inserted and secured the Purstring suture of 0 Vicryl.  Pneumoperitoneum was created and video camera was inserted.  An 11 mm trochars placed in subxiphoid region and two 5 mm trochars placed in the right upper quadrant.     We elevated the gallbladder fundus.  There were extensive but chronic adhesions on the gallbladder and we slowly took these down with sharp and blunt dissection until we got down to the infundibulum.  We lifted up the infundibulum and we had to slowly dissected the infundibulum down to the cystic duct where there were a couple of stones in  the distal gallbladder and proximal cystic duct.  We found the cystic artery as it was going on to the wall of the gallbladder, secured it with multiple metal clips and divided it.  We retracted the liver out of the way and continued our dissection until we got all the way around the cystic duct.  We milked the 2 stones from the cystic duct back up into the gallbladder and we had a huge window behind the cystic duct.  We secured the cystic duct with multiple medical clips and divided it.    The gallbladder was dissected from its bed with electrocautery placed in a specimen bag and removed.  We had made one tiny hole in the gallbladder and some bile and sludge came out but this was all evacuated.  We had to  cauterize a couple of areas in the bed of the gallbladder.  We irrigated thoroughly.  I placed a piece of Snow hemostatic sponge in the bed of the gallbladder and it was hemostatic.     All the irrigation fluid was evacuated.  There was no evidence of bleeding or bile leak.  The pneumoperitoneum was released and the trochars were removed.  The fascia at the umbilicus was closed with 0 Vicryl sutures.  The skin incisions were closed with subcuticular sutures of 4-0 Monocryl and Dermabond.  Patient tolerated procedure well was taken to PACU in stable condition.  EBL 20 mL or less.  Counts correct.  Complications none.     Angelia Mould. Derrell Lolling, M.D., FACS General and Minimally Invasive Surgery Breast and Colorectal Surgery  10/09/2015 12:45 PM

## 2015-10-09 NOTE — Anesthesia Postprocedure Evaluation (Signed)
Anesthesia Post Note  Patient: Dorothy Haynes  Procedure(s) Performed: Procedure(s) (LRB): LAPAROSCOPIC CHOLECYSTECTOMY  (N/A)  Patient location during evaluation: PACU Anesthesia Type: General Level of consciousness: awake and alert Pain management: pain level controlled Vital Signs Assessment: post-procedure vital signs reviewed and stable Respiratory status: spontaneous breathing, nonlabored ventilation, respiratory function stable and patient connected to nasal cannula oxygen Cardiovascular status: blood pressure returned to baseline and stable Postop Assessment: no signs of nausea or vomiting Anesthetic complications: no    Last Vitals:  Filed Vitals:   10/09/15 1315 10/09/15 1330  BP: 150/91 148/88  Pulse: 88 93  Temp:    Resp: 20 15    Last Pain:  Filed Vitals:   10/09/15 1333  PainSc: 8                  [name]

## 2015-10-09 NOTE — Discharge Instructions (Signed)
[facility] ______[facility] °LAPAROSCOPIC SURGERY: POST OP INSTRUCTIONS °Always review your discharge instruction sheet given to you by the facility where your surgery was performed. °IF YOU HAVE DISABILITY OR FAMILY LEAVE FORMS, YOU MUST BRING THEM TO THE OFFICE FOR PROCESSING.   °DO NOT GIVE THEM TO YOUR DOCTOR. ° °1. A prescription for pain medication may be given to you upon discharge.  Take your pain medication as prescribed, if needed.  If narcotic pain medicine is not needed, then you may take acetaminophen (Tylenol) or ibuprofen (Advil) as needed. °2. Take your usually prescribed medications unless otherwise directed. °3. If you need a refill on your pain medication, please contact your pharmacy.  They will contact our office to request authorization. Prescriptions will not be filled after 5pm or on week-ends. °4. You should follow a light diet the first few days after arrival home, such as soup and crackers, etc.  Be sure to include lots of fluids daily. °5. Most patients will experience some swelling and bruising in the area of the incisions.  Ice packs will help.  Swelling and bruising can take several days to resolve.  °6. It is common to experience some constipation if taking pain medication after surgery.  Increasing fluid intake and taking a stool softener (such as Colace) will usually help or prevent this problem from occurring.  A mild laxative (Milk of Magnesia or Miralax) should be taken according to package instructions if there are no bowel movements after 48 hours. °7. Unless discharge instructions indicate otherwise, you may remove your bandages 24-48 hours after surgery, and you may shower at that time.  You may have steri-strips (small skin tapes) in place directly over the incision.  These strips should be left on the skin for 7-10 days.  If your surgeon used skin glue on the incision, you may shower in 24 hours.  The glue will flake off over the next 2-3 weeks.  Any sutures or  staples will be removed at the office during your follow-up visit. °8. ACTIVITIES:  You may resume regular (light) daily activities beginning the next day--such as daily self-care, walking, climbing stairs--gradually increasing activities as tolerated.  You may have sexual intercourse when it is comfortable.  Refrain from any heavy lifting or straining until approved by your doctor. °a. You may drive when you are no longer taking prescription pain medication, you can comfortably wear a seatbelt, and you can safely maneuver your car and apply brakes. °b. RETURN TO WORK:  __________________________________________________________ °9. You should see your doctor in the office for a follow-up appointment approximately 2-3 weeks after your surgery.  Make sure that you call for this appointment within a day or two after you arrive home to insure a convenient appointment time. °10. OTHER INSTRUCTIONS: __________________________________________________________________________________________________________________________ __________________________________________________________________________________________________________________________ °WHEN TO CALL YOUR DOCTOR: °1. Fever over 101.0 °2. Inability to urinate °3. Continued bleeding from incision. °4. Increased pain, redness, or drainage from the incision. °5. Increasing abdominal pain ° °The clinic staff is available to answer your questions during regular business hours.  Please don’t hesitate to call and ask to speak to one of the nurses for clinical concerns.  If you have a medical emergency, go to the nearest emergency room or call 911.  A surgeon from [facility] is always on call at the hospital. °[address], [address], [place], [place]  [address] ? [address] [address], [place], [place]   [address] °[phone] ? [phone] ? FAX [phone] °Web site:   [email or web address] °

## 2015-10-09 NOTE — Interval H&P Note (Signed)
History and Physical Interval Note:  10/09/2015 10:53 AM  Dorothy Haynes  has presented today for surgery, with the diagnosis of gallstones  The various methods of treatment have been discussed with the patient and family. After consideration of risks, benefits and other options for treatment, the patient has consented to  Procedure(s): LAPAROSCOPIC CHOLECYSTECTOMY WITH INTRAOPERATIVE CHOLANGIOGRAM POSSIBLE OPEN (N/A) as a surgical intervention .  The patient's history has been reviewed, patient examined, no change in status, stable for surgery.  I have reviewed the patient's chart and labs.  Questions were answered to the patient's satisfaction.     Ernestene Mention

## 2015-12-19 ENCOUNTER — Emergency Department (HOSPITAL_COMMUNITY)
Admission: EM | Admit: 2015-12-19 | Discharge: 2015-12-19 | Disposition: A | Discharge status: Home / Self Care | Payer: Medicaid Other | Attending: Emergency Medicine | Admitting: Emergency Medicine

## 2015-12-19 ENCOUNTER — Encounter (HOSPITAL_COMMUNITY): Payer: Self-pay | Admitting: Emergency Medicine

## 2015-12-19 DIAGNOSIS — J069 Acute upper respiratory infection, unspecified: Secondary | ICD-10-CM | POA: Insufficient documentation

## 2015-12-19 DIAGNOSIS — R059 Cough, unspecified: Secondary | ICD-10-CM

## 2015-12-19 DIAGNOSIS — Z79899 Other long term (current) drug therapy: Secondary | ICD-10-CM | POA: Diagnosis not present

## 2015-12-19 DIAGNOSIS — F17210 Nicotine dependence, cigarettes, uncomplicated: Secondary | ICD-10-CM | POA: Diagnosis not present

## 2015-12-19 DIAGNOSIS — I10 Essential (primary) hypertension: Secondary | ICD-10-CM | POA: Insufficient documentation

## 2015-12-19 DIAGNOSIS — Z8632 Personal history of gestational diabetes: Secondary | ICD-10-CM | POA: Insufficient documentation

## 2015-12-19 DIAGNOSIS — R05 Cough: Secondary | ICD-10-CM

## 2015-12-19 DIAGNOSIS — Z8719 Personal history of other diseases of the digestive system: Secondary | ICD-10-CM | POA: Insufficient documentation

## 2015-12-19 MED ORDER — ALBUTEROL SULFATE HFA 108 (90 BASE) MCG/ACT IN AERS
2.0000 | INHALATION_SPRAY | Freq: Once | RESPIRATORY_TRACT | Status: AC
  Administered 2015-12-19: 2 via RESPIRATORY_TRACT
  Filled 2015-12-19: qty 6.7

## 2015-12-19 MED ORDER — BENZONATATE 100 MG PO CAPS: 200.0000 mg | ORAL_CAPSULE | Freq: Two times a day (BID) | ORAL | Status: DC | PRN

## 2015-12-19 MED ORDER — IPRATROPIUM-ALBUTEROL 0.5-2.5 (3) MG/3ML IN SOLN
3.0000 mL | Freq: Once | RESPIRATORY_TRACT | Status: AC
  Administered 2015-12-19: 3 mL via RESPIRATORY_TRACT
  Filled 2015-12-19: qty 3

## 2015-12-19 MED ORDER — CETIRIZINE-PSEUDOEPHEDRINE ER 5-120 MG PO TB12: 1.0000 | ORAL_TABLET | Freq: Two times a day (BID) | ORAL | Status: DC | PRN

## 2015-12-19 MED ORDER — BENZONATATE 100 MG PO CAPS
200.0000 mg | ORAL_CAPSULE | Freq: Once | ORAL | Status: AC
  Administered 2015-12-19: 200 mg via ORAL
  Filled 2015-12-19: qty 2

## 2015-12-19 MED ORDER — PREDNISONE 20 MG PO TABS
60.0000 mg | ORAL_TABLET | Freq: Once | ORAL | Status: AC
  Administered 2015-12-19: 60 mg via ORAL
  Filled 2015-12-19: qty 3

## 2015-12-19 MED ORDER — PREDNISONE 20 MG PO TABS
40.0000 mg | ORAL_TABLET | Freq: Every day | ORAL | Status: DC
Start: 2015-12-19 — End: 2016-08-19

## 2015-12-19 MED ORDER — GUAIFENESIN-CODEINE 100-10 MG/5ML PO SOLN: 5.0000 mL | Freq: Three times a day (TID) | ORAL | Status: DC | PRN

## 2015-12-19 NOTE — ED Notes (Signed)
Pt reports sore throat and cough x 1 week. Also report headache. Reports shortness of breath and wheezing. sts feels facial pressure and nasal congestion. Denies chest pain.

## 2015-12-19 NOTE — ED Provider Notes (Signed)
History  By signing my name below, I, [name], attest that this documentation has been prepared under the direction and in the presence of [name], PA-C. Electronically Signed: [name], ED Scribe. 12/19/2015. 5:34 PM.  Chief Complaint  Patient presents with  . Sore Throat  . Cough   The history is provided by the patient and medical records. No language interpreter was used.    HPI Comments:  Dorothy Haynes is a 30 y.o. obese female who presents to the Emergency Department complaining of sore throat that began about a week ago. She reports associated rhinorrhea, HA and productive cough of green sputum with wheezing and SOB. She states she has had sick contacts of similar symptoms. She reports taking a BC Powder with no significant relief of the symptoms. Pt reports drinking an entire bottle of NyQuil. She denies fever, chills, otalgia, sinus pressure, abdominal pain, nausea, vomiting, diarrhea. She denies h/o seasonal allergies.   Past Medical History  Diagnosis Date  . Hypertension   . Gallstones   . Shortness of breath dyspnea   . Gestational diabetes   . GERD (gastroesophageal reflux disease)   . Headache   . Morbid obesity (HCC) 10/09/2015  . Tobacco abuse 10/09/2015   Past Surgical History  Procedure Laterality Date  . Wisdom tooth extraction    . Cholecystectomy N/A 10/09/2015    Procedure: LAPAROSCOPIC CHOLECYSTECTOMY ;  Surgeon: Claud KelpHaywood Ingram, MD;  Location: [facility];  Service: General;  Laterality: N/A;   No family history on file. Social History  Substance Use Topics  . Smoking status: Current Every Day Smoker -- 0.25 packs/day for 0 years    Types: Cigarettes  . Smokeless tobacco: Never Used  . Alcohol Use: Yes     Comment: occ   OB History    No data available     Review of Systems  HENT: Positive for rhinorrhea and sore throat.   Respiratory: Positive for cough and wheezing.   Neurological: Positive for headaches.  All other systems  reviewed and are negative.   Allergies  Review of patient's allergies indicates no known allergies.  Home Medications   Prior to Admission medications   Medication Sig Start Date End Date Taking? Authorizing Provider  Aspirin-Salicylamide-Caffeine (BC HEADACHE POWDER PO) Take 1 each by mouth daily as needed (pain.).    Historical Provider, MD  Beclomethasone Dipropionate (QVAR IN) Inhale 1 puff into the lungs every 4 (four) hours as needed (shortness of breathe.).    Historical Provider, MD  benzonatate (TESSALON) 100 MG capsule Take 2 capsules (200 mg total) by mouth 2 (two) times daily as needed for cough. 12/19/15   Danelle BerryLeisa Hoyt Leanos, PA-C  cetirizine-pseudoephedrine (ZYRTEC-D) 5-120 MG tablet Take 1 tablet by mouth 2 (two) times daily as needed for allergies or rhinitis. 12/19/15   Danelle BerryLeisa Quinntin Malter, PA-C  guaiFENesin-codeine 100-10 MG/5ML syrup Take 5 mLs by mouth 3 (three) times daily as needed for cough. 12/19/15   Danelle BerryLeisa Ambar Raphael, PA-C  HYDROcodone-acetaminophen (NORCO/VICODIN) 5-325 MG tablet Take 1 tablet by mouth every 6 (six) hours as needed for moderate pain or severe pain. 10/09/15   Claud KelpHaywood Ingram, MD  oxyCODONE-acetaminophen (PERCOCET) 5-325 MG tablet Take 1 tablet by mouth every 6 (six) hours as needed. Patient taking differently: Take 1 tablet by mouth every 6 (six) hours as needed for moderate pain.  08/31/15   Richardean Canalavid H Yao, MD  predniSONE (DELTASONE) 20 MG tablet Take 2 tablets (40 mg total) by mouth daily. 12/19/15   Sheliah MendsLeisa  Angelica Chessman, PA-C   Triage Vitals: BP 139/87 mmHg  Pulse 90  Temp(Src) 99.5 F (37.5 C) (Oral)  Resp 18  SpO2 99% Physical Exam  Constitutional: She is oriented to person, place, and time. She appears well-developed and well-nourished. No distress.  HENT:  Head: Normocephalic and atraumatic.  Nose: Nose normal.  Mouth/Throat: Oropharynx is clear and moist. No oropharyngeal exudate.  Eyes: Conjunctivae and EOM are normal. Pupils are equal, round, and reactive to light. Right  eye exhibits no discharge. Left eye exhibits no discharge. No scleral icterus.  Neck: Normal range of motion. No JVD present. No tracheal deviation present. No thyromegaly present.  Cardiovascular: Normal rate, regular rhythm, normal heart sounds and intact distal pulses.  Exam reveals no gallop and no friction rub.   No murmur heard. Pulmonary/Chest: Effort normal and breath sounds normal. No respiratory distress. She has no wheezes. She has no rales. She exhibits no tenderness.  Abdominal: Soft. Bowel sounds are normal. She exhibits no distension and no mass. There is no tenderness. There is no rebound and no guarding.  Musculoskeletal: Normal range of motion. She exhibits no edema or tenderness.  Lymphadenopathy:    She has no cervical adenopathy.  Neurological: She is alert and oriented to person, place, and time. She has normal reflexes. No cranial nerve deficit. She exhibits normal muscle tone. Coordination normal.  Skin: Skin is warm and dry. No rash noted. She is not diaphoretic. No erythema. No pallor.  Psychiatric: She has a normal mood and affect. Her behavior is normal. Judgment and thought content normal.  Nursing note and vitals reviewed.   ED Course  Procedures (including critical care time) DIAGNOSTIC STUDIES: Oxygen Saturation is 99% on RA, normal by my interpretation.   COORDINATION OF CARE: 5:33 PM- Will order breathing treatment. Will prescribe cough medication, antihistamine and steroids. Pt verbalizes understanding and agrees to plan.  Medications  albuterol (PROVENTIL HFA;VENTOLIN HFA) 108 (90 Base) MCG/ACT inhaler 2 puff (not administered)  ipratropium-albuterol (DUONEB) 0.5-2.5 (3) MG/3ML nebulizer solution 3 mL (3 mLs Nebulization Given 12/19/15 1741)  predniSONE (DELTASONE) tablet 60 mg (60 mg Oral Given 12/19/15 1741)  benzonatate (TESSALON) capsule 200 mg (200 mg Oral Given 12/19/15 1741)    Labs Review Labs Reviewed - No data to display  Imaging Review No  results found. I have personally reviewed and evaluated these images and lab results as part of my medical decision-making.   EKG Interpretation None      MDM   Pt with nasal congestion, ST and cough with intermittent wheeze.  No wheeze or respiratory distress on exam, lungs CTA.   Patients symptoms are consistent with URI, likely viral etiology. Discussed that antibiotics are not indicated for viral infections. Pt will be discharged with symptomatic treatment, including steroids, inhaler, cough suppressant, antihistamine and decongestant.  Verbalizes understanding and is agreeable with plan. Pt is hemodynamically stable & in NAD prior to dc.   Final diagnoses:  URI (upper respiratory infection)  Cough   I personally performed the services described in this documentation, which was scribed in my presence. The recorded information has been reviewed and is accurate.       Danelle Berry, PA-C 12/19/15 1800  Loren Racer, MD 12/22/15 [phone]

## 2015-12-19 NOTE — Discharge Instructions (Signed)
Upper Respiratory Infection, Adult °Most upper respiratory infections (URIs) are a viral infection of the air passages leading to the lungs. A URI affects the nose, throat, and upper air passages. The most common type of URI is nasopharyngitis and is typically referred to as "the common cold." °URIs run their course and usually go away on their own. Most of the time, a URI does not require medical attention, but sometimes a bacterial infection in the upper airways can follow a viral infection. This is called a secondary infection. Sinus and middle ear infections are common types of secondary upper respiratory infections. °Bacterial pneumonia can also complicate a URI. A URI can worsen asthma and chronic obstructive pulmonary disease (COPD). Sometimes, these complications can require emergency medical care and may be life threatening.  °CAUSES °Almost all URIs are caused by viruses. A virus is a type of germ and can spread from one person to another.  °RISKS FACTORS °You may be at risk for a URI if:  °· You smoke.   °· You have chronic heart or lung disease. °· You have a weakened defense (immune) system.   °· You are very young or very old.   °· You have nasal allergies or asthma. °· You work in crowded or poorly ventilated areas. °· You work in health care facilities or schools. °SIGNS AND SYMPTOMS  °Symptoms typically develop 2-3 days after you come in contact with a cold virus. Most viral URIs last 7-10 days. However, viral URIs from the influenza virus (flu virus) can last 14-18 days and are typically more severe. Symptoms may include:  °· Runny or stuffy (congested) nose.   °· Sneezing.   °· Cough.   °· Sore throat.   °· Headache.   °· Fatigue.   °· Fever.   °· Loss of appetite.   °· Pain in your forehead, behind your eyes, and over your cheekbones (sinus pain). °· Muscle aches.   °DIAGNOSIS  °Your health care provider may diagnose a URI by: °· Physical exam. °· Tests to check that your symptoms are not due to  another condition such as: °· Strep throat. °· Sinusitis. °· Pneumonia. °· Asthma. °TREATMENT  °A URI goes away on its own with time. It cannot be cured with medicines, but medicines may be prescribed or recommended to relieve symptoms. Medicines may help: °· Reduce your fever. °· Reduce your cough. °· Relieve nasal congestion. °HOME CARE INSTRUCTIONS  °· Take medicines only as directed by your health care provider.   °· Gargle warm saltwater or take cough drops to comfort your throat as directed by your health care provider. °· Use a warm mist humidifier or inhale steam from a shower to increase air moisture. This may make it easier to breathe. °· Drink enough fluid to keep your urine clear or pale yellow.   °· Eat soups and other clear broths and maintain good nutrition.   °· Rest as needed.   °· Return to work when your temperature has returned to normal or as your health care provider advises. You may need to stay home longer to avoid infecting others. You can also use a face mask and careful hand washing to prevent spread of the virus. °· Increase the usage of your inhaler if you have asthma.   °· Do not use any tobacco products, including cigarettes, chewing tobacco, or electronic cigarettes. If you need help quitting, ask your health care provider. °PREVENTION  °The best way to protect yourself from getting a cold is to practice good hygiene.  °· Avoid oral or hand contact with people with cold   symptoms.   Wash your hands often if contact occurs.  There is no clear evidence that vitamin C, vitamin E, echinacea, or exercise reduces the chance of developing a cold. However, it is always recommended to get plenty of rest, exercise, and practice good nutrition.  SEEK MEDICAL CARE IF:   You are getting worse rather than better.   Your symptoms are not controlled by medicine.   You have chills.  You have worsening shortness of breath.  You have brown or red mucus.  You have yellow or brown nasal  discharge.  You have pain in your face, especially when you bend forward.  You have a fever.  You have swollen neck glands.  You have pain while swallowing.  You have white areas in the back of your throat. SEEK IMMEDIATE MEDICAL CARE IF:   You have severe or persistent:  Headache.  Ear pain.  Sinus pain.  Chest pain.  You have chronic lung disease and any of the following:  Wheezing.  Prolonged cough.  Coughing up blood.  A change in your usual mucus.  You have a stiff neck.  You have changes in your:  Vision.  Hearing.  Thinking.  Mood. MAKE SURE YOU:   Understand these instructions.  Will watch your condition.  Will get help right away if you are not doing well or get worse.   This information is not intended to replace advice given to you by your health care provider. Make sure you discuss any questions you have with your health care provider.   Document Released: 02/11/2001 Document Revised: 01/02/2015 Document Reviewed: 11/23/2013 Elsevier Interactive Patient Education 2016 [employer] Inc[employer]  Cough, Adult Coughing is a reflex that clears your throat and your airways. Coughing helps to heal and protect your lungs. It is normal to cough occasionally, but a cough that happens with other symptoms or lasts a long time may be a sign of a condition that needs treatment. A cough may last only 2-3 weeks (acute), or it may last longer than 8 weeks (chronic). CAUSES Coughing is commonly caused by:  Breathing in substances that irritate your lungs.  A viral or bacterial respiratory infection.  Allergies.  Asthma.  Postnasal drip.  Smoking.  Acid backing up from the stomach into the esophagus (gastroesophageal reflux).  Certain medicines.  Chronic lung problems, including COPD (or rarely, lung cancer).  Other medical conditions such as heart failure. HOME CARE INSTRUCTIONS  Pay attention to any changes in your symptoms. Take these actions to  help with your discomfort:  Take medicines only as told by your health care provider.  If you were prescribed an antibiotic medicine, take it as told by your health care provider. Do not stop taking the antibiotic even if you start to feel better.  Talk with your health care provider before you take a cough suppressant medicine.  Drink enough fluid to keep your urine clear or pale yellow.  If the air is dry, use a cold steam vaporizer or humidifier in your bedroom or your home to help loosen secretions.  Avoid anything that causes you to cough at work or at home.  If your cough is worse at night, try sleeping in a semi-upright position.  Avoid cigarette smoke. If you smoke, quit smoking. If you need help quitting, ask your health care provider.  Avoid caffeine.  Avoid alcohol.  Rest as needed. SEEK MEDICAL CARE IF:   You have new symptoms.  You cough up pus.  Your cough  does not get better after 2-3 weeks, or your cough gets worse.  You cannot control your cough with suppressant medicines and you are losing sleep.  You develop pain that is getting worse or pain that is not controlled with pain medicines.  You have a fever.  You have unexplained weight loss.  You have night sweats. SEEK IMMEDIATE MEDICAL CARE IF:  You cough up blood.  You have difficulty breathing.  Your heartbeat is very fast.   This information is not intended to replace advice given to you by your health care provider. Make sure you discuss any questions you have with your health care provider.   Document Released: 02/14/2011 Document Revised: 05/09/2015 Document Reviewed: 10/25/2014 Elsevier Interactive Patient Education 2016 [employer]  Allergies An allergy is an abnormal reaction to a substance by the body's defense system (immune system). Allergies can develop at any age. WHAT CAUSES ALLERGIES? An allergic reaction happens when the immune system mistakenly reacts to a normally  harmless substance, called an allergen, as if it were harmful. The immune system releases antibodies to fight the substance. Antibodies eventually release a chemical called histamine into the bloodstream. The release of histamine is meant to protect the body from infection, but it also causes discomfort. An allergic reaction can be triggered by:  Eating an allergen.  Inhaling an allergen.  Touching an allergen. WHAT TYPES OF ALLERGIES ARE THERE? There are many types of allergies. Common types include:  Seasonal allergies. People with this type of allergy are usually allergic to substances that are only present during certain seasons, such as molds and pollens.  Food allergies.  Drug allergies.  Insect allergies.  Animal dander allergies. WHAT ARE SYMPTOMS OF ALLERGIES? Possible allergy symptoms include:  Swelling of the lips, face, tongue, mouth, or throat.  Sneezing, coughing, or wheezing.  Nasal congestion.  Tingling in the mouth.  Rash.  Itching.  Itchy, red, swollen areas of skin (hives).  Watery eyes.  Vomiting.  Diarrhea.  Dizziness.  Lightheadedness.  Fainting.  Trouble breathing or swallowing.  Chest tightness.  Rapid heartbeat. HOW ARE ALLERGIES DIAGNOSED? Allergies are diagnosed with a medical and family history and one or more of the following:  Skin tests.  Blood tests.  A food diary. A food diary is a record of all the foods and drinks you have in a day and of all the symptoms you experience.  The results of an elimination diet. An elimination diet involves eliminating foods from your diet and then adding them back in one by one to find out if a certain food causes an allergic reaction. HOW ARE ALLERGIES TREATED? There is no cure for allergies, but allergic reactions can be treated with medicine. Severe reactions usually need to be treated at a hospital. HOW CAN REACTIONS BE PREVENTED? The best way to prevent an allergic reaction is by  avoiding the substance you are allergic to. Allergy shots and medicines can also help prevent reactions in some cases. People with severe allergic reactions may be able to prevent a life-threatening reaction called anaphylaxis with a medicine given right after exposure to the allergen.   This information is not intended to replace advice given to you by your health care provider. Make sure you discuss any questions you have with your health care provider.   Document Released: 11/11/2002 Document Revised: 09/08/2014 Document Reviewed: 05/30/2014 Elsevier Interactive Patient Education [employer]

## 2016-01-09 ENCOUNTER — Emergency Department (HOSPITAL_COMMUNITY)
Admission: EM | Admit: 2016-01-09 | Discharge: 2016-01-09 | Disposition: A | Discharge status: Home / Self Care | Payer: Medicaid Other | Attending: Emergency Medicine | Admitting: Emergency Medicine

## 2016-01-09 ENCOUNTER — Encounter (HOSPITAL_COMMUNITY): Payer: Self-pay | Admitting: Emergency Medicine

## 2016-01-09 ENCOUNTER — Emergency Department (HOSPITAL_COMMUNITY): Payer: Medicaid Other

## 2016-01-09 DIAGNOSIS — Z79891 Long term (current) use of opiate analgesic: Secondary | ICD-10-CM | POA: Diagnosis not present

## 2016-01-09 DIAGNOSIS — J029 Acute pharyngitis, unspecified: Secondary | ICD-10-CM | POA: Insufficient documentation

## 2016-01-09 DIAGNOSIS — Z6841 Body Mass Index (BMI) 40.0 and over, adult: Secondary | ICD-10-CM | POA: Insufficient documentation

## 2016-01-09 DIAGNOSIS — F17210 Nicotine dependence, cigarettes, uncomplicated: Secondary | ICD-10-CM | POA: Insufficient documentation

## 2016-01-09 DIAGNOSIS — Z7952 Long term (current) use of systemic steroids: Secondary | ICD-10-CM | POA: Diagnosis not present

## 2016-01-09 DIAGNOSIS — Z7951 Long term (current) use of inhaled steroids: Secondary | ICD-10-CM | POA: Insufficient documentation

## 2016-01-09 DIAGNOSIS — R059 Cough, unspecified: Secondary | ICD-10-CM

## 2016-01-09 DIAGNOSIS — R05 Cough: Secondary | ICD-10-CM

## 2016-01-09 DIAGNOSIS — K219 Gastro-esophageal reflux disease without esophagitis: Secondary | ICD-10-CM | POA: Diagnosis not present

## 2016-01-09 DIAGNOSIS — I10 Essential (primary) hypertension: Secondary | ICD-10-CM | POA: Diagnosis not present

## 2016-01-09 DIAGNOSIS — Z79899 Other long term (current) drug therapy: Secondary | ICD-10-CM | POA: Insufficient documentation

## 2016-01-09 LAB — RAPID STREP SCREEN (MED CTR MEBANE ONLY): Streptococcus, Group A Screen (Direct): NEGATIVE

## 2016-01-09 MED ORDER — AMOXICILLIN 500 MG PO CAPS: 500.0000 mg | ORAL_CAPSULE | Freq: Three times a day (TID) | ORAL | Status: DC

## 2016-01-09 NOTE — Discharge Instructions (Signed)
Take the prescribed medication as directed. °Follow-up with your primary care doctor. °Return to the ED for new or worsening symptoms. °

## 2016-01-09 NOTE — ED Provider Notes (Signed)
CSN: [id number]     Arrival date & time 01/09/16  1612 History  By signing my name below, I, Dorothy Haynes, attest that this documentation has been prepared under the direction and in the presence of Sharilyn Sites, PA-C. Electronically Signed: Octavia Haynes, ED Scribe. 01/09/2016. 5:34 PM.     Chief Complaint  Patient presents with  . Sore Throat  . Cough      The history is provided by the patient. No language interpreter was used.   HPI Comments: Dorothy Haynes is a 30 y.o. female who has a PMHx of HTN, gallstones, GERD and morbid obesity presents to the Emergency Department complaining of a constant, gradual worsening, moderate sore throat onset 3 weeks ago. She has been having an associated wheezing and productive cough that brings up green sputum. She describes her pain as a "needle" sensation in her throat. Pt was seen on 4/19 for the same symptoms and was told she had bronchitis but her symptoms have not gotten any better. Pt has been taking OTC medication to alleviate her symptoms with no relief. She denies fever, chest pain, SOB, abdominal pain, nausea, vomiting, or diarrhea.  Past Medical History  Diagnosis Date  . Hypertension   . Gallstones   . Shortness of breath dyspnea   . Gestational diabetes   . GERD (gastroesophageal reflux disease)   . Headache   . Morbid obesity (HCC) 10/09/2015  . Tobacco abuse 10/09/2015   Past Surgical History  Procedure Laterality Date  . Wisdom tooth extraction    . Cholecystectomy N/A 10/09/2015    Procedure: LAPAROSCOPIC CHOLECYSTECTOMY ;  Surgeon: Claud Kelp, MD;  Location: [facility];  Service: General;  Laterality: N/A;   History reviewed. No pertinent family history. Social History  Substance Use Topics  . Smoking status: Current Every Day Smoker -- 0.25 packs/day for 0 years    Types: Cigarettes  . Smokeless tobacco: Never Used  . Alcohol Use: Yes     Comment: occ   OB History    No data available     Review of Systems   Constitutional: Negative for fever.  HENT: Positive for sore throat.   Respiratory: Positive for wheezing.   All other systems reviewed and are negative.     Allergies  Review of patient's allergies indicates no known allergies.  Home Medications   Prior to Admission medications   Medication Sig Start Date End Date Taking? Authorizing Provider  Beclomethasone Dipropionate (QVAR IN) Inhale 1 puff into the lungs every 4 (four) hours as needed (shortness of breathe.).    Historical Provider, MD  benzonatate (TESSALON) 100 MG capsule Take 2 capsules (200 mg total) by mouth 2 (two) times daily as needed for cough. Patient not taking: Reported on 01/09/2016 12/19/15   Danelle Berry, PA-C  cetirizine-pseudoephedrine (ZYRTEC-D) 5-120 MG tablet Take 1 tablet by mouth 2 (two) times daily as needed for allergies or rhinitis. Patient not taking: Reported on 01/09/2016 12/19/15   Danelle Berry, PA-C  guaiFENesin-codeine 100-10 MG/5ML syrup Take 5 mLs by mouth 3 (three) times daily as needed for cough. Patient not taking: Reported on 01/09/2016 12/19/15   Danelle Berry, PA-C  HYDROcodone-acetaminophen (NORCO/VICODIN) 5-325 MG tablet Take 1 tablet by mouth every 6 (six) hours as needed for moderate pain or severe pain. Patient not taking: Reported on 01/09/2016 10/09/15   Claud Kelp, MD  oxyCODONE-acetaminophen (PERCOCET) 5-325 MG tablet Take 1 tablet by mouth every 6 (six) hours as needed. Patient not taking: Reported on 01/09/2016  08/31/15   Richardean Canalavid H Yao, MD  predniSONE (DELTASONE) 20 MG tablet Take 2 tablets (40 mg total) by mouth daily. Patient not taking: Reported on 01/09/2016 12/19/15   Danelle BerryLeisa Tapia, PA-C   Triage vitals: BP 127/95 mmHg  Pulse 102  Temp(Src) 98.5 F (36.9 C) (Oral)  Resp 16  Ht 5\' 3"  (1.6 m)  Wt 271 lb (122.925 kg)  BMI 48.02 kg/m2  SpO2 97% Physical Exam  Constitutional: She is oriented to person, place, and time. She appears well-developed and well-nourished. No distress.  HENT:   Head: Normocephalic and atraumatic.  Right Ear: Tympanic membrane and ear canal normal.  Left Ear: Tympanic membrane and ear canal normal.  Nose: Nose normal.  Mouth/Throat: Uvula is midline and mucous membranes are normal. Posterior oropharyngeal erythema present.  Tonsils erythematous and 2+ bilaterally without exudate; uvula midline without evidence of peritonsillar abscess; handling secretions appropriately; no difficulty swallowing or speaking; normal phonation without stridor  Eyes: Conjunctivae and EOM are normal. Pupils are equal, round, and reactive to light.  Neck: Normal range of motion. Neck supple.  Cardiovascular: Normal rate, regular rhythm and normal heart sounds.   Pulmonary/Chest: Effort normal. No respiratory distress. She has wheezes.  Very mild end-expiratory wheezes noted on exam, no distress, speaking in full sentences without difficulty  Abdominal: Soft. Bowel sounds are normal. There is no tenderness. There is no guarding.  Musculoskeletal: Normal range of motion.  Neurological: She is alert and oriented to person, place, and time.  Skin: Skin is warm and dry. She is not diaphoretic.  Psychiatric: She has a normal mood and affect.  Nursing note and vitals reviewed.   ED Course  Procedures  DIAGNOSTIC STUDIES: Oxygen Saturation is 97% on RA, normal by my interpretation.  COORDINATION OF CARE:  5:33 PM Will order CXR. Discussed treatment plan with pt at bedside and pt agreed to plan.  Labs Review Labs Reviewed  RAPID STREP SCREEN (NOT AT [facility])  CULTURE, GROUP A STREP [facility])    Imaging Review Dg Chest 2 View  01/09/2016  CLINICAL DATA:  Productive cough x1 month, wheezing EXAM: CHEST  2 VIEW COMPARISON:  02/20/2015 FINDINGS: Lungs are clear.  No pleural effusion or pneumothorax. The heart is normal in size. Visualized osseous structures are within normal limits. Cholecystectomy clips. IMPRESSION: Normal chest radiographs. Electronically Signed   By: Charline BillsSriyesh   Krishnan M.D.   On: 01/09/2016 18:48   I have personally reviewed and evaluated these images and lab results as part of my medical decision-making.   EKG Interpretation None      MDM   Final diagnoses:  Sore throat  Cough   30 year old female here with cough and sore throat for the past 3 weeks. Was seen in the ED last month for the same, Dynacin a viral URI but reports no improvement. Patient is afebrile, nontoxic. Her tonsils are erythematous and enlarged bilaterally but there are no exudates noted. She is handling her secretions well, normal phonation without stridor. She does have some mild end-expiratory wheezes but denies shortness of breath or chest pain. Rapid strep is negative, culture pending. Chest x-ray is clear. Patient did not respond to conservative/symptomatic treatment, will start on course of amoxicillin. She was encouraged to follow-up with me if no improvement in the next few days.  Discussed plan with patient, he/she acknowledged understanding and agreed with plan of care.  Return precautions given for new or worsening symptoms.  I personally performed the services described in this documentation, which was  scribed in my presence. The recorded information has been reviewed and is accurate.  Garlon Hatchet, PA-C 01/09/16 1926  Donnetta Hutching, MD 01/09/16 [phone]

## 2016-01-09 NOTE — ED Notes (Addendum)
Pt reports sore throat for the past 3 weeks that is not getting better. Tonsils slightly enlarged. Pain with swallowing. Also has productive cough.

## 2016-01-11 LAB — CULTURE, GROUP A STREP (THRC)

## 2016-04-14 ENCOUNTER — Emergency Department (HOSPITAL_COMMUNITY)
Admission: EM | Admit: 2016-04-14 | Discharge: 2016-04-14 | Disposition: A | Discharge status: Home / Self Care | Payer: BLUE CROSS/BLUE SHIELD | Attending: Emergency Medicine | Admitting: Emergency Medicine

## 2016-04-14 ENCOUNTER — Encounter (HOSPITAL_COMMUNITY): Payer: Self-pay | Admitting: Emergency Medicine

## 2016-04-14 DIAGNOSIS — R0602 Shortness of breath: Secondary | ICD-10-CM | POA: Insufficient documentation

## 2016-04-14 DIAGNOSIS — F17210 Nicotine dependence, cigarettes, uncomplicated: Secondary | ICD-10-CM | POA: Insufficient documentation

## 2016-04-14 DIAGNOSIS — H9202 Otalgia, left ear: Secondary | ICD-10-CM | POA: Diagnosis present

## 2016-04-14 DIAGNOSIS — I10 Essential (primary) hypertension: Secondary | ICD-10-CM | POA: Diagnosis not present

## 2016-04-14 DIAGNOSIS — H6092 Unspecified otitis externa, left ear: Secondary | ICD-10-CM | POA: Diagnosis not present

## 2016-04-14 DIAGNOSIS — Z7952 Long term (current) use of systemic steroids: Secondary | ICD-10-CM | POA: Diagnosis not present

## 2016-04-14 DIAGNOSIS — Z79899 Other long term (current) drug therapy: Secondary | ICD-10-CM | POA: Diagnosis not present

## 2016-04-14 MED ORDER — OFLOXACIN 0.3 % OT SOLN: 10.0000 [drp] | Freq: Every day | OTIC | 0 refills | Status: AC

## 2016-04-14 NOTE — ED Triage Notes (Addendum)
Pt reports left hearing loss onset yesterday not relieved by using Q tip yesterday. Pt continues to report left ear pain with chewing.  Pt reports hypertension but does not take her medication for it.

## 2016-04-14 NOTE — ED Provider Notes (Signed)
[facility]-EMERGENCY DEPT Provider Note   CSN: [id number] Arrival date & time: 04/14/16  1349     History   Chief Complaint Chief Complaint  Patient presents with  . Hearing Loss    HPI Dorothy Haynes is a 30 y.o. female.  The history is provided by the patient.  CC: otalgia  Onset/Duration: 2 days Timing: constant Location: left ear Quality: aching Severity: moderate Modifying Factors:  Improved by: nothing  Worsened by: touch/stinking qtip in Associated Signs/Symptoms:  Pertinent (+): nasal congestion, rhinorrhea  Pertinent (-): fever, mastoid pain, headache, neck pain Context: went to the beach last weekend and swam in the pool. Son had ear infection as well.  Past Medical History:  Diagnosis Date  . Gallstones   . GERD (gastroesophageal reflux disease)   . Gestational diabetes   . Headache   . Hypertension   . Morbid obesity (HCC) 10/09/2015  . Shortness of breath dyspnea   . Tobacco abuse 10/09/2015    Patient Active Problem List   Diagnosis Date Noted  . Gallstones 10/09/2015  . Morbid obesity (HCC) 10/09/2015  . Tobacco abuse 10/09/2015    Past Surgical History:  Procedure Laterality Date  . CHOLECYSTECTOMY N/A 10/09/2015   Procedure: LAPAROSCOPIC CHOLECYSTECTOMY ;  Surgeon: Claud KelpHaywood Ingram, MD;  Location: [facility];  Service: General;  Laterality: N/A;  . WISDOM TOOTH EXTRACTION      OB History    No data available       Home Medications    Prior to Admission medications   Medication Sig Start Date End Date Taking? Authorizing Provider  Beclomethasone Dipropionate (QVAR IN) Inhale 1 puff into the lungs every 4 (four) hours as needed (shortness of breathe.).    Historical Provider, MD  benzonatate (TESSALON) 100 MG capsule Take 2 capsules (200 mg total) by mouth 2 (two) times daily as needed for cough. Patient not taking: Reported on 01/09/2016 12/19/15   Danelle BerryLeisa Tapia, PA-C  cetirizine-pseudoephedrine (ZYRTEC-D) 5-120 MG tablet Take 1 tablet by mouth 2  (two) times daily as needed for allergies or rhinitis. Patient not taking: Reported on 01/09/2016 12/19/15   Danelle BerryLeisa Tapia, PA-C  guaiFENesin-codeine 100-10 MG/5ML syrup Take 5 mLs by mouth 3 (three) times daily as needed for cough. Patient not taking: Reported on 01/09/2016 12/19/15   Danelle BerryLeisa Tapia, PA-C  HYDROcodone-acetaminophen (NORCO/VICODIN) 5-325 MG tablet Take 1 tablet by mouth every 6 (six) hours as needed for moderate pain or severe pain. Patient not taking: Reported on 01/09/2016 10/09/15   Claud KelpHaywood Ingram, MD  ofloxacin (FLOXIN OTIC) 0.3 % otic solution Place 10 drops into the left ear daily. 04/14/16 04/21/16  Nira ConnPedro Eduardo Cardama, MD  oxyCODONE-acetaminophen (PERCOCET) 5-325 MG tablet Take 1 tablet by mouth every 6 (six) hours as needed. Patient not taking: Reported on 01/09/2016 08/31/15   Charlynne Panderavid Hsienta Yao, MD  predniSONE (DELTASONE) 20 MG tablet Take 2 tablets (40 mg total) by mouth daily. Patient not taking: Reported on 01/09/2016 12/19/15   Danelle BerryLeisa Tapia, PA-C    Family History No family history on file.  Social History Social History  Substance Use Topics  . Smoking status: Current Every Day Smoker    Packs/day: 0.25    Years: 0.00    Types: Cigarettes  . Smokeless tobacco: Never Used  . Alcohol use Yes     Comment: occ     Allergies   Review of patient's allergies indicates no known allergies.   Review of Systems Review of Systems  HENT: Positive for congestion and  rhinorrhea.   Eyes: Negative for visual disturbance.  Respiratory: Positive for shortness of breath. Negative for cough.   Cardiovascular: Negative for chest pain and leg swelling.  Gastrointestinal: Negative for abdominal distention, abdominal pain, diarrhea, nausea and vomiting.  Genitourinary: Negative for dysuria.  All other systems reviewed and are negative.    Physical Exam Updated Vital Signs BP (!) 167/105 (BP Location: Right Arm)   Pulse 95   Temp 99 F (37.2 C) (Oral)   Resp 18   Ht 5\' 3"   (1.6 m)   Wt 278 lb (126.1 kg)   SpO2 95%   BMI 49.25 kg/m   Physical Exam  Constitutional: She is oriented to person, place, and time. She appears well-developed and well-nourished. No distress.  HENT:  Head: Normocephalic and atraumatic.  Right Ear: External ear normal. No middle ear effusion.  Left Ear: No mastoid tenderness.  No middle ear effusion.  Nose: Nose normal.  External meatus with purulence and erythema. TM intact.   Eyes: Conjunctivae and EOM are normal. Pupils are equal, round, and reactive to light. Right eye exhibits no discharge. Left eye exhibits no discharge. No scleral icterus.  Neck: Normal range of motion. Neck supple.  Cardiovascular: Normal rate, regular rhythm and normal heart sounds.  Exam reveals no gallop and no friction rub.   No murmur heard. Pulmonary/Chest: Effort normal and breath sounds normal. No stridor. No respiratory distress. She has no wheezes.  Abdominal: Soft. She exhibits no distension. There is no tenderness.  Musculoskeletal: She exhibits no edema or tenderness.  Neurological: She is alert and oriented to person, place, and time.  Skin: Skin is warm and dry. No rash noted. She is not diaphoretic. No erythema.  Psychiatric: She has a normal mood and affect.     ED Treatments / Results  Labs (all labs ordered are listed, but only abnormal results are displayed) Labs Reviewed - No data to display  EKG  EKG Interpretation None       Radiology No results found.  Procedures Procedures (including critical care time)  Medications Ordered in ED Medications - No data to display   Initial Impression / Assessment and Plan / ED Course  I have reviewed the triage vital signs and the nursing notes.  Pertinent labs & imaging results that were available during my care of the patient were reviewed by me and considered in my medical decision making (see chart for details).  Clinical Course    Otitis externa. No AOM or mastoid  tenderness. Will treat with otic Abx.    Final Clinical Impressions(s) / ED Diagnoses   Final diagnoses:  Otitis externa, left   Disposition: Discharge  Condition: Good  I have discussed the results, Dx and Tx plan with the patient who expressed understanding and agree(s) with the plan. Discharge instructions discussed at great length. The patient was given strict return precautions who verbalized understanding of the instructions. No further questions at time of discharge.    Discharge Medication List as of 04/14/2016  2:37 PM    START taking these medications   Details  ofloxacin (FLOXIN OTIC) 0.3 % otic solution Place 10 drops into the left ear daily., Starting Mon 04/14/2016, Until Mon 04/21/2016, Print        Follow Up: No Pcp Per Patient  Call  For help establishing care with a care provider      Nira ConnPedro Eduardo Cardama, MD 04/15/16 0005

## 2016-04-20 NOTE — Addendum Note (Signed)
Encounter addended by: Nira ConnPedro Eduardo Cardama, MD on: 04/20/2016  1:22 AM<BR>    Actions taken: Letter status changed

## 2016-05-31 NOTE — Addendum Note (Signed)
Encounter addended by: Nira ConnPedro Eduardo Raylene Carmickle, MD on: 05/31/2016  7:39 PM<BR>    Actions taken: Letter status changed

## 2016-08-07 ENCOUNTER — Encounter: Payer: Self-pay | Admitting: Family Medicine

## 2016-08-07 ENCOUNTER — Other Ambulatory Visit: Payer: Self-pay | Admitting: Family Medicine

## 2016-08-15 ENCOUNTER — Emergency Department (HOSPITAL_COMMUNITY): Payer: Medicaid Other

## 2016-08-15 ENCOUNTER — Emergency Department (HOSPITAL_COMMUNITY)
Admission: EM | Admit: 2016-08-15 | Discharge: 2016-08-15 | Disposition: A | Discharge status: Home / Self Care | Payer: Medicaid Other | Attending: Emergency Medicine | Admitting: Emergency Medicine

## 2016-08-15 ENCOUNTER — Encounter (HOSPITAL_COMMUNITY): Payer: Self-pay | Admitting: Emergency Medicine

## 2016-08-15 DIAGNOSIS — Z79899 Other long term (current) drug therapy: Secondary | ICD-10-CM | POA: Diagnosis not present

## 2016-08-15 DIAGNOSIS — F17210 Nicotine dependence, cigarettes, uncomplicated: Secondary | ICD-10-CM | POA: Diagnosis not present

## 2016-08-15 DIAGNOSIS — R109 Unspecified abdominal pain: Secondary | ICD-10-CM | POA: Insufficient documentation

## 2016-08-15 DIAGNOSIS — I10 Essential (primary) hypertension: Secondary | ICD-10-CM | POA: Diagnosis not present

## 2016-08-15 DIAGNOSIS — R05 Cough: Secondary | ICD-10-CM | POA: Insufficient documentation

## 2016-08-15 DIAGNOSIS — R059 Cough, unspecified: Secondary | ICD-10-CM

## 2016-08-15 LAB — URINALYSIS, ROUTINE W REFLEX MICROSCOPIC
BILIRUBIN URINE: NEGATIVE
Glucose, UA: NEGATIVE mg/dL
KETONES UR: NEGATIVE mg/dL
LEUKOCYTES UA: NEGATIVE
NITRITE: NEGATIVE
PROTEIN: NEGATIVE mg/dL
Specific Gravity, Urine: 1.029 (ref 1.005–1.030)
pH: 5 (ref 5.0–8.0)

## 2016-08-15 LAB — COMPREHENSIVE METABOLIC PANEL
ALT: 17 U/L (ref 14–54)
AST: 16 U/L (ref 15–41)
Albumin: 4.2 g/dL (ref 3.5–5.0)
Alkaline Phosphatase: 75 U/L (ref 38–126)
Anion gap: 8 (ref 5–15)
BUN: 13 mg/dL (ref 6–20)
CO2: 27 mmol/L (ref 22–32)
Calcium: 8.9 mg/dL (ref 8.9–10.3)
Chloride: 103 mmol/L (ref 101–111)
Creatinine, Ser: 0.67 mg/dL (ref 0.44–1.00)
GFR calc Af Amer: >60 mL/min (ref >60)
GFR calc non Af Amer: >60 mL/min (ref >60)
Glucose, Bld: 102 mg/dL — ABNORMAL HIGH (ref 65–99)
Potassium: 3.7 mmol/L (ref 3.5–5.1)
Sodium: 138 mmol/L (ref 135–145)
Total Bilirubin: 0.3 mg/dL (ref 0.3–1.2)
Total Protein: 7.8 g/dL (ref 6.5–8.1)

## 2016-08-15 LAB — CBC WITH DIFFERENTIAL/PLATELET
Basophils Absolute: 0 10*3/uL (ref 0.0–0.1)
Basophils Relative: 0 %
Eosinophils Absolute: 0.3 10*3/uL (ref 0.0–0.7)
Eosinophils Relative: 2 %
HCT: 39.5 % (ref 36.0–46.0)
Hemoglobin: 13.3 g/dL (ref 12.0–15.0)
Lymphocytes Relative: 32 %
Lymphs Abs: 5.5 10*3/uL — ABNORMAL HIGH (ref 0.7–4.0)
MCH: 29.5 pg (ref 26.0–34.0)
MCHC: 33.7 g/dL (ref 30.0–36.0)
MCV: 87.6 fL (ref 78.0–100.0)
Monocytes Absolute: 0.8 10*3/uL (ref 0.1–1.0)
Monocytes Relative: 5 %
Neutro Abs: 10.7 10*3/uL — ABNORMAL HIGH (ref 1.7–7.7)
Neutrophils Relative %: 61 %
Platelets: 477 10*3/uL — ABNORMAL HIGH (ref 150–400)
RBC: 4.51 MIL/uL (ref 3.87–5.11)
RDW: 13 % (ref 11.5–15.5)
WBC: 17.4 10*3/uL — ABNORMAL HIGH (ref 4.0–10.5)

## 2016-08-15 LAB — PREGNANCY, URINE: Preg Test, Ur: NEGATIVE

## 2016-08-15 LAB — POC URINE PREG, ED: PREG TEST UR: NEGATIVE

## 2016-08-15 MED ORDER — ALBUTEROL SULFATE HFA 108 (90 BASE) MCG/ACT IN AERS: 1.0000 | INHALATION_SPRAY | Freq: Four times a day (QID) | RESPIRATORY_TRACT | 0 refills | Status: DC | PRN

## 2016-08-15 MED ORDER — AZITHROMYCIN 250 MG PO TABS: 250.0000 mg | ORAL_TABLET | Freq: Every day | ORAL | 0 refills | Status: DC

## 2016-08-15 MED ORDER — KETOROLAC TROMETHAMINE 30 MG/ML IJ SOLN
30.0000 mg | Freq: Once | INTRAMUSCULAR | Status: AC
  Administered 2016-08-15: 30 mg via INTRAVENOUS
  Filled 2016-08-15: qty 1

## 2016-08-15 MED ORDER — SODIUM CHLORIDE 0.9 % IJ SOLN
INTRAMUSCULAR | Status: AC
  Filled 2016-08-15: qty 50

## 2016-08-15 MED ORDER — IBUPROFEN 800 MG PO TABS: 800.0000 mg | ORAL_TABLET | Freq: Three times a day (TID) | ORAL | 0 refills | Status: DC

## 2016-08-15 MED ORDER — IOPAMIDOL (ISOVUE-300) INJECTION 61%
INTRAVENOUS | Status: AC
  Filled 2016-08-15: qty 100

## 2016-08-15 MED ORDER — IOPAMIDOL (ISOVUE-300) INJECTION 61%
100.0000 mL | Freq: Once | INTRAVENOUS | Status: AC | PRN
  Administered 2016-08-15: 100 mL via INTRAVENOUS

## 2016-08-15 NOTE — Discharge Instructions (Signed)
Medications: Albuterol inhaler, Z-Pak, ibuprofen  Treatment: Take Z-Pak as prescribed. Make sure to finish all his medication. Take ibuprofen every 8 hours as needed for your pain. Use albuterol inhaler every 4-6 hours as needed for cough, shortness of breath, wheezing.  Follow-up: Please follow-up with your primary care provider on Tuesday. Please return to emergency department if you develop any new or worsening symptoms.

## 2016-08-15 NOTE — Progress Notes (Signed)
Pt states she is scheduled to see a dr at [facility] on Tuesday, August 19 2016 She can not recall the name of the provider

## 2016-08-15 NOTE — Progress Notes (Signed)
Patient noted to have Medicaid [place] Access without a pcp.  [facility] on patient's insurance card [facility]. Patient noted to have [facility] to [facility] 08/19/2016 at 0830am.

## 2016-08-15 NOTE — ED Notes (Signed)
This RN went to stick pt. 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 Pt stated that only someone who "is old and has worked here for 20 years" can start her IV. This RN then told pt was can use US and see the vein

## 2016-08-15 NOTE — ED Triage Notes (Signed)
Pt report R flank pain for the past 2 days. No n/v/d or dysuria.

## 2016-08-15 NOTE — ED Provider Notes (Signed)
[facility]-EMERGENCY DEPT Provider Note   CSN: [id number] Arrival date & time: 08/15/16  1513     History   Chief Complaint Chief Complaint  Patient presents with  . Flank Pain    HPI Dorothy Haynes is a 30 y.o. female with history of cholecystectomy on 10/08/15, hypertension who presents with right flank, right upper quadrant, right lower quadrant pain that has been present since surgery, but has been worsening over the past few days. Patient reports she has a strange feeling in her RLQ when she lays on her right side. Patient also reports a productive cough with yellow mucus that has been persistent since an URI around 3 weeks ago. Patient reports her other symptoms are resolved. Patient reports chest pain only when she coughs. She denies shortness of breath. Patient states she has felt warm at night, but no documented fever. Patient denies any pelvic pain, vaginal discharge, abnormal bleeding. Patient has recently discontinued the Depo-Provera injection in his not had a period for years.  HPI  Past Medical History:  Diagnosis Date  . GERD (gastroesophageal reflux disease) 2009  . Gestational diabetes 2008  . Headache   . History of gallstones 10/2015   s/p cholecystectomy 10/09/15  . Hypertension   . Morbid obesity (HCC) 10/09/2015  . Shortness of breath dyspnea   . Tobacco abuse 10/09/2015    Patient Active Problem List   Diagnosis Date Noted  . Gallstones 10/09/2015  . Morbid obesity (HCC) 10/09/2015  . Tobacco abuse 10/09/2015    Past Surgical History:  Procedure Laterality Date  . CHOLECYSTECTOMY N/A 10/09/2015   Procedure: LAPAROSCOPIC CHOLECYSTECTOMY ;  Surgeon: Claud KelpHaywood Ingram, MD;  Location: [facility];  Service: General;  Laterality: N/A;  . WISDOM TOOTH EXTRACTION      OB History    No data available       Home Medications    Prior to Admission medications   Medication Sig Start Date End Date Taking? Authorizing Provider  albuterol (PROVENTIL HFA;VENTOLIN HFA) 108  (90 Base) MCG/ACT inhaler Inhale 1-2 puffs into the lungs every 6 (six) hours as needed for wheezing or shortness of breath. 08/15/16   Emi HolesAlexandra M Dayanis Bergquist, PA-C  azithromycin (ZITHROMAX) 250 MG tablet Take 1 tablet (250 mg total) by mouth daily. Take first 2 tablets together, then 1 every day until finished. 08/15/16   Zaynah Chawla M Geraldine Sandberg, PA-C  benzonatate (TESSALON) 100 MG capsule Take 2 capsules (200 mg total) by mouth 2 (two) times daily as needed for cough. Patient not taking: Reported on 01/09/2016 12/19/15   Danelle BerryLeisa Tapia, PA-C  cetirizine-pseudoephedrine (ZYRTEC-D) 5-120 MG tablet Take 1 tablet by mouth 2 (two) times daily as needed for allergies or rhinitis. Patient not taking: Reported on 01/09/2016 12/19/15   Danelle BerryLeisa Tapia, PA-C  guaiFENesin-codeine 100-10 MG/5ML syrup Take 5 mLs by mouth 3 (three) times daily as needed for cough. Patient not taking: Reported on 01/09/2016 12/19/15   Danelle BerryLeisa Tapia, PA-C  HYDROcodone-acetaminophen (NORCO/VICODIN) 5-325 MG tablet Take 1 tablet by mouth every 6 (six) hours as needed for moderate pain or severe pain. Patient not taking: Reported on 01/09/2016 10/09/15   Claud KelpHaywood Ingram, MD  ibuprofen (ADVIL,MOTRIN) 800 MG tablet Take 1 tablet (800 mg total) by mouth 3 (three) times daily. 08/15/16   Emi HolesAlexandra M Azai Gaffin, PA-C  oxyCODONE-acetaminophen (PERCOCET) 5-325 MG tablet Take 1 tablet by mouth every 6 (six) hours as needed. Patient not taking: Reported on 01/09/2016 08/31/15   Charlynne Panderavid Hsienta Yao, MD  predniSONE (DELTASONE) 20 MG tablet  Take 2 tablets (40 mg total) by mouth daily. Patient not taking: Reported on 01/09/2016 12/19/15   Danelle Berry, PA-C    Family History Family History  Problem Relation Age of Onset  . Diabetes Mother   . Cancer Mother   . Kidney disease Father   . Alzheimer's disease Other   . Diabetes Other   . Hypertension Other   . Heart disease Other   . Heart disease Other   . Diabetes Other   . Hypertension Other   . Thyroid disease Other      Social History Social History  Substance Use Topics  . Smoking status: Current Every Day Smoker    Packs/day: 0.25    Years: 0.00    Types: Cigarettes  . Smokeless tobacco: Never Used  . Alcohol use Yes     Comment: occ, 1 drink per day      Allergies   Patient has no known allergies.   Review of Systems Review of Systems  Constitutional: Negative for chills and fever.  HENT: Negative for facial swelling and sore throat.   Respiratory: Positive for cough (productive). Negative for shortness of breath.   Cardiovascular: Positive for chest pain (only with cough).  Gastrointestinal: Positive for abdominal pain. Negative for blood in stool, diarrhea, nausea and vomiting.  Genitourinary: Negative for dysuria.  Musculoskeletal: Negative for back pain.  Skin: Negative for rash and wound.  Neurological: Negative for headaches.  Psychiatric/Behavioral: The patient is not nervous/anxious.      Physical Exam Updated Vital Signs BP 134/93 (BP Location: Right Arm)   Pulse 88   Temp 98.3 F (36.8 C) (Oral)   Resp 17   Ht 5\' 3"  (1.6 m)   Wt 127.5 kg   SpO2 99%   BMI 49.78 kg/m   Physical Exam  Constitutional: She appears well-developed and well-nourished. No distress.  HENT:  Head: Normocephalic and atraumatic.  Mouth/Throat: Oropharynx is clear and moist. No oropharyngeal exudate.  Eyes: Conjunctivae are normal. Pupils are equal, round, and reactive to light. Right eye exhibits no discharge. Left eye exhibits no discharge. No scleral icterus.  Neck: Normal range of motion. Neck supple. No thyromegaly present.  Cardiovascular: Normal rate, regular rhythm, normal heart sounds and intact distal pulses.  Exam reveals no gallop and no friction rub.   No murmur heard. Pulmonary/Chest: Effort normal and breath sounds normal. No stridor. No respiratory distress. She has no wheezes. She has no rales.  Abdominal: Soft. Bowel sounds are normal. She exhibits no distension. There is  tenderness in the right upper quadrant and right lower quadrant. There is no rebound, no guarding and no CVA tenderness.    Musculoskeletal: She exhibits no edema.       Back:  Lymphadenopathy:    She has no cervical adenopathy.  Neurological: She is alert. Coordination normal.  Skin: Skin is warm and dry. No rash noted. She is not diaphoretic. No pallor.  Psychiatric: She has a normal mood and affect.  Nursing note and vitals reviewed.    ED Treatments / Results  Labs (all labs ordered are listed, but only abnormal results are displayed) Labs Reviewed  URINALYSIS, ROUTINE W REFLEX MICROSCOPIC - Abnormal; Notable for the following:       Result Value   APPearance HAZY (*)    Hgb urine dipstick SMALL (*)    Bacteria, UA RARE (*)    Squamous Epithelial / LPF TOO NUMEROUS TO COUNT (*)    All other components within normal  limits  COMPREHENSIVE METABOLIC PANEL - Abnormal; Notable for the following:    Glucose, Bld 102 (*)    All other components within normal limits  CBC WITH DIFFERENTIAL/PLATELET - Abnormal; Notable for the following:    WBC 17.4 (*)    Platelets 477 (*)    Neutro Abs 10.7 (*)    Lymphs Abs 5.5 (*)    All other components within normal limits  PREGNANCY, URINE  POC URINE PREG, ED    EKG  EKG Interpretation None       Radiology Dg Chest 2 View  Result Date: 08/15/2016 CLINICAL DATA:  Cough for 3 weeks EXAM: CHEST  2 VIEW COMPARISON:  01/09/2016 FINDINGS: The heart size and mediastinal contours are within normal limits. Both lungs are clear. The visualized skeletal structures are unremarkable. IMPRESSION: No active cardiopulmonary disease. Electronically Signed   By: Alcide Clever M.D.   On: 08/15/2016 17:24   Ct Abdomen Pelvis W Contrast  Result Date: 08/15/2016 CLINICAL DATA:  Right flank pain for 2 days EXAM: CT ABDOMEN AND PELVIS WITH CONTRAST TECHNIQUE: Multidetector CT imaging of the abdomen and pelvis was performed using the standard protocol  following bolus administration of intravenous contrast. CONTRAST:  ISOVUE-300 IOPAMIDOL (ISOVUE-300) INJECTION 61% COMPARISON:  Ultrasound 08/31/2015, CT 08/10/2015 FINDINGS: Lower chest: Lung bases demonstrate no acute consolidation or pleural effusion. Heart size within normal limits. Hepatobiliary: No focal hepatic abnormality. Interim cholecystectomy. No biliary dilatation. Pancreas: Unremarkable. No pancreatic ductal dilatation or surrounding inflammatory changes. Spleen: Normal in size without focal abnormality. Adrenals/Urinary Tract: Adrenal glands are unremarkable. Kidneys are normal, without renal calculi, focal lesion, or hydronephrosis. Bladder is unremarkable. Stomach/Bowel: Stomach is within normal limits. Appendix appears normal. No evidence of bowel wall thickening, distention, or inflammatory changes. Scattered colon diverticula without acute inflammation Vascular/Lymphatic: No significant vascular findings are present. No enlarged abdominal or pelvic lymph nodes. Reproductive: Uterus and bilateral adnexa are unremarkable. Other: No free air or free fluid.  Tiny fatty umbilical hernia. Musculoskeletal: No acute or significant osseous findings. IMPRESSION: No CT evidence for acute intra- abdominal or pelvic pathology. Interval cholecystectomy. Normal appendix. Electronically Signed   By: Jasmine Pang M.D.   On: 08/15/2016 18:31    Procedures Procedures (including critical care time)  Medications Ordered in ED Medications  iopamidol (ISOVUE-300) 61 % injection (not administered)  sodium chloride 0.9 % injection (not administered)  ketorolac (TORADOL) 30 MG/ML injection 30 mg (30 mg Intravenous Given 08/15/16 1656)  iopamidol (ISOVUE-300) 61 % injection 100 mL (100 mLs Intravenous Contrast Given 08/15/16 1815)     Initial Impression / Assessment and Plan / ED Course  I have reviewed the triage vital signs and the nursing notes.  Pertinent labs & imaging results that were  available during my care of the patient were reviewed by me and considered in my medical decision making (see chart for details).  Clinical Course     Patient with most likely musculoskeletal pain, possibly from coughing. CBC shows WBC 17.4, however patient has persistently elevated WBC count. CMP unremarkable. Urine pregnancy negative. UA showed small hematuria, rare bacteria, too numerous to count squamous epithelial cells. CT abdomen pelvis shows no evidence for acute intra-abdominal or pelvic pathology, normal appendix. CXR shows no active cardiopulmonary disease. However, considering with the disease and productive cough, we'll treat with Zithromax, albuterol inhaler. Patient discharged home with ibuprofen for pain control. Follow-up to PCP at scheduled appointment in 4 days. Return precautions discussed. Patient understands and agrees with plan. Patient  vitals stable throughout ED course and discharged in satisfactory condition. Patient also evaluated by Dr. Anitra LauthPlunkett who guided the patient's management and agrees with plan.  Final Clinical Impressions(s) / ED Diagnoses   Final diagnoses:  Cough  Right flank pain    New Prescriptions Discharge Medication List as of 08/15/2016  7:32 PM    START taking these medications   Details  azithromycin (ZITHROMAX) 250 MG tablet Take 1 tablet (250 mg total) by mouth daily. Take first 2 tablets together, then 1 every day until finished., Starting Fri 08/15/2016, Print    ibuprofen (ADVIL,MOTRIN) 800 MG tablet Take 1 tablet (800 mg total) by mouth 3 (three) times daily., Starting Fri 08/15/2016, Print         Emi Holeslexandra M Morgen Ritacco, PA-C 08/15/16 2029    Gwyneth SproutWhitney Plunkett, MD 08/15/16 2302

## 2016-08-19 ENCOUNTER — Ambulatory Visit (INDEPENDENT_AMBULATORY_CARE_PROVIDER_SITE_OTHER): Payer: Medicaid Other | Admitting: Family Medicine

## 2016-08-19 ENCOUNTER — Encounter: Payer: Self-pay | Admitting: Family Medicine

## 2016-08-19 VITALS — BP 132/66 | HR 89 | Temp 98.1°F | Wt 284.8 lb

## 2016-08-19 DIAGNOSIS — R7303 Prediabetes: Secondary | ICD-10-CM | POA: Insufficient documentation

## 2016-08-19 DIAGNOSIS — N898 Other specified noninflammatory disorders of vagina: Secondary | ICD-10-CM | POA: Diagnosis not present

## 2016-08-19 DIAGNOSIS — Z720 Tobacco use: Secondary | ICD-10-CM | POA: Diagnosis not present

## 2016-08-19 LAB — LIPID PANEL
CHOLESTEROL: 155 mg/dL (ref <200)
HDL: 35 mg/dL — ABNORMAL LOW (ref >50)
LDL Cholesterol: 69 mg/dL (ref <100)
TRIGLYCERIDES: 256 mg/dL — AB (ref <150)
Total CHOL/HDL Ratio: 4.4 Ratio (ref <5.0)
VLDL: 51 mg/dL — AB (ref <30)

## 2016-08-19 LAB — POCT WET PREP (WET MOUNT)
Clue Cells Wet Prep Whiff POC: NEGATIVE
TRICHOMONAS WET PREP HPF POC: ABSENT

## 2016-08-19 LAB — POCT GLYCOSYLATED HEMOGLOBIN (HGB A1C): Hemoglobin A1C: 6.5

## 2016-08-19 MED ORDER — METFORMIN HCL 500 MG PO TABS: 500.0000 mg | ORAL_TABLET | Freq: Every day | ORAL | 3 refills | Status: DC

## 2016-08-19 MED ORDER — FLUCONAZOLE 150 MG PO TABS: 150.0000 mg | ORAL_TABLET | Freq: Once | ORAL | 0 refills | Status: AC

## 2016-08-19 NOTE — Assessment & Plan Note (Signed)
   Discussed smoking cessation. 

## 2016-08-19 NOTE — Patient Instructions (Addendum)
It was great to meet you again!  For your vaginal discharge - We are checking for yeast infection today  We are also checking for diabetes and your cholesterol levels today. Someone will call you or send you a letter with the results when they are available.   Diet Recommendations for Diabetes   Starchy (carb) foods: Bread, rice, pasta, potatoes, corn, cereal, grits, crackers, bagels, muffins, all baked goods.  (Fruits, milk, and yogurt also have carbohydrate, but most of these foods will not spike your blood sugar as most starchy foods will.)  A few fruits do cause high blood sugars; use small portions of bananas (limit to 1/2 at a time), grapes, watermelon, oranges, and most tropical fruits.    Protein foods: Meat, fish, poultry, eggs, dairy foods, and beans such as pinto and kidney beans (beans also provide carbohydrate).   1. Eat at least 3 meals and 1-2 snacks per day. Never go more than 4-5 hours while awake without eating. Eat breakfast within the first hour of getting up.   2. Limit starchy foods to TWO per meal and ONE per snack. ONE portion of a starchy  food is equal to the following:   - ONE slice of bread (or its equivalent, such as half of a hamburger bun).   - 1/2 cup of a "scoopable" starchy food such as potatoes or rice.   - 15 grams of Total Carbohydrate as shown on food label.  3. Include at every meal: a protein food, a carb food, and vegetables and/or fruit.   - Obtain twice the volume of veg's as protein or carbohydrate foods for both lunch and dinner.   - Fresh or frozen veg's are best.   - Keep frozen veg's on hand for a quick vegetable serving.       Intrauterine Device Information Introduction An intrauterine device (IUD) is a medical device that gets inserted into the uterus to prevent pregnancy. It is a small, T-shaped device that has one or two nylon strings hanging down from it. The strings hang out of the lower part of the uterus (cervix) to allow for future  IUD removal. There are two types of IUDs available:  Copper IUD. This type of IUD has copper wire wrapped around it. A copper IUD may last up to 10 years.  Hormone IUD. This type of IUD is made of plastic and contains the hormone progestin (synthetic progesterone). A hormone IUD may last 3 to 5 years. IUDs are inserted through the vagina and placed into the uterus with a minor medical procedure. How does the IUD work? Copper in the copper IUD prevents pregnancy by making the uterus and fallopian tubes produce a fluid that kills sperm. Synthetic progesterone in hormonal IUD prevents pregnancy by:  Thickening cervical mucus to prevent sperm from entering the uterus.  Thinning the uterine lining to prevent implantation of a fertilized egg.  Weakening or killing sperm that get into the uterus. What are the advantages of an IUD?  IUDs are highly effective, reversible, long-acting, and low-maintenance.  There are no estrogen-related side effects.  An IUD can be used when breastfeeding.  IUDs are not associated with weight gain.  Advantages of the copper IUD are that:  It works immediately after insertion.  It does not interfere with your body's natural hormones.  It can be used for 10 years.  Advantages of the hormone IUD are that:  If it is inserted within 7 days of your period starting, it works  immediately after insertion. If the hormone IUD is inserted at any other time in your cycle, you will need to use a backup method of birth control for 7 days after insertion.  It can make menstrual periods lighter.  It can decrease menstrual cramping.  It can be used for 3 or 5 years. What are the disadvantages of an IUD?  The hormone IUD may cause irregular menstrual bleeding for a period of time after insertion.  The copper IUD can make your menstrual flow heavier and more painful.  You may experience some pain during insertion, and cramping and vaginal bleeding after  insertion. How is the IUD removed? Is the IUD right for me?  Your health care provider will make sure you are a good candidate for an IUD and will discuss side effects with you. This information is not intended to replace advice given to you by your health care provider. Make sure you discuss any questions you have with your health care provider. Document Released: 07/22/2004 Document Revised: 01/24/2016 Document Reviewed: 02/06/2013  2017 Elsevier  Please schedule a visit to discuss contraception options and we will follow up on some your health maintenance things at that time as well.  Take care and seek immediate care sooner if you develop any concerns.   Dr. Leland HerElsia J Jeramine Delis, DO [facility]

## 2016-08-19 NOTE — Assessment & Plan Note (Signed)
Wet mount did not show yeast infection but given has elevated a1c and is still on antibiotics for another 2 days with history of yeast infection on antibiotics in the past, will send in diflucan in case patient needs it.

## 2016-08-19 NOTE — Assessment & Plan Note (Signed)
A1c remains elevated today. Will try lifestyle modifications of diet and exercise. Given also has other risk factors i.e. Is morbidly obese will also start metformin 500mg  daily for prevention

## 2016-08-19 NOTE — Progress Notes (Signed)
Subjective:  Dorothy Haynes is a 30 y.o. female who [facility] to establish as new patient  HPI: [facility]. Has never been followed with a PCP.  Prediabetes - Was told in Feb 2017 that was prediabetic. Has been gaining weight since then and having difficulty eating a healthy diet. Feels ready to make lifestyle changes and is asking for guidance on healthy diet. - Is open to starting metformin if needed.  - Denies polyuria, polydipsia, paraesthesias, vision changes.   Tobacco abuse - Has been self weaning down on the number of cigarettes she smokes daily. Is now down to 1-2 a day and will frequently have a day intermittently when she does not smoke at all. Has set her quit date for 09/01/16.  Vaginal discharge - Recently seen in ED for productive cough and was prescribed azithromycin. Has continued to have cough but phlegm is now longer greens is feeling overall improved from a respiratory standpoint. Has occasional chest wall pain with cough but no CP. - Since being on antibiotics has started to have increased vaginal discharge, thick and whitish, not itchy but some discomfort that is reminiscent of yeast infection but feels slightly different. Denies concerns for STDs, foul odor, vaginal bleeding.  Anxiety - Has had panic attacks in the past and wants to discuss at future visit.  Need for contraception - Previously on depo, caused her to have weight gain. Is interested in the IUD. Is currently sexually active with one female partner, monogamous longstanding relationship, uses condoms.   ROS: Per HPI, otherwise all systems reviewed and are negative  PMH:  The following were reviewed and entered/updated in epic: Past Medical History:  Diagnosis Date  . GERD (gastroesophageal reflux disease) 2009  . Gestational diabetes 2008  . Headache   . History of gallstones 10/2015   s/p cholecystectomy 10/09/15  .  Hypertension   . Morbid obesity (HCC) 10/09/2015  . Shortness of breath dyspnea   . Tobacco abuse 10/09/2015   Patient Active Problem List   Diagnosis Date Noted  . Diabetes (HCC) 08/19/2016  . Gallstones 10/09/2015  . Morbid obesity (HCC) 10/09/2015  . Tobacco abuse 10/09/2015   Past Surgical History:  Procedure Laterality Date  . CHOLECYSTECTOMY N/A 10/09/2015   Procedure: LAPAROSCOPIC CHOLECYSTECTOMY ;  Surgeon: Claud KelpHaywood Ingram, MD;  Location: [facility] ORS;  Service: General;  Laterality: N/A;  . WISDOM TOOTH EXTRACTION      Family History  Problem Relation Age of Onset  . Diabetes Mother   . Hypertension Mother   . Throat cancer Father   . Kidney Stones Father   . Alzheimer's disease Other   . Diabetes Other   . Hypertension Other   . Heart disease Other   . Heart disease Other   . Diabetes Other   . Hypertension Other   . Thyroid disease Other   . Diabetes Maternal Grandmother   . Diabetes Maternal Grandfather     Medications- reviewed and updated Current Outpatient Prescriptions  Medication Sig Dispense Refill  . albuterol (PROVENTIL HFA;VENTOLIN HFA) 108 (90 Base) MCG/ACT inhaler Inhale 1-2 puffs into the lungs every 6 (six) hours as needed for wheezing or shortness of breath. 1 Inhaler 0  . azithromycin (ZITHROMAX) 250 MG tablet Take 1 tablet (250 mg total) by mouth daily. Take first 2 tablets together, then 1 every day until finished. 6 tablet 0  . fluconazole (DIFLUCAN) 150 MG tablet Take 1 tablet (  150 mg total) by mouth once. 1 tablet 0  . metFORMIN (GLUCOPHAGE) 500 MG tablet Take 1 tablet (500 mg total) by mouth daily. 30 tablet 3   No current facility-administered medications for this visit.     Allergies-reviewed and updated No Known Allergies  Social History   Social History  . Marital status: Single    Spouse name: N/A  . Number of children: 1  . Years of education: college graduate   Occupational History  .  [employer]    part-time   Social History Main  Topics  . Smoking status: Current Every Day Smoker    Packs/day: 0.20    Years: 17.00    Types: Cigarettes  . Smokeless tobacco: Never Used  . Alcohol use Yes     Comment: occ, 1-2 drink per week  . Drug use: No     Comment: previously used MJ, has been off recreational drugs  . Sexual activity: Yes    Partners: Male    Birth control/ protection: Condom   Other Topics Concern  . None   Social History Narrative   Lives with mother and son    Objective:  Physical Exam: BP 132/66   Pulse 89   Temp 98.1 F (36.7 C) (Oral)   Wt 284 lb 12.8 oz (129.2 kg)   SpO2 97%   BMI 50.45 kg/m   Gen: NAD, resting comfortably HEENT: [place], AT. TMs clear b/l. MMM, oropharynx nonerythematous CV: RRR with no murmurs appreciated Pulm: NWOB, CTAB with no crackles, wheezes, or rhonchi GI: Normal bowel sounds present. Soft, Nontender, Nondistended. MSK: no edema, cyanosis, or clubbing noted Skin: warm, dry Neuro: grossly normal, moves all extremities Psych: Normal affect and thought content  Results for orders placed or performed in visit on 08/19/16 (from the past 72 hour(s))  POCT Wet Prep [facility](Wet Mount)     Status: Abnormal   Collection Time: 08/19/16  9:24 AM  Result Value Ref Range   Source Wet Prep POC VAG    WBC, Wet Prep HPF POC 1-5    Bacteria Wet Prep HPF POC Many (A) Few   Clue Cells Wet Prep HPF POC None None   Clue Cells Wet Prep Whiff POC Negative Whiff    Yeast Wet Prep HPF POC None    Trichomonas Wet Prep HPF POC Absent Absent  POCT glycosylated hemoglobin (Hb A1C)     Status: None   Collection Time: 08/19/16  9:30 AM  Result Value Ref Range   Hemoglobin A1C 6.5      Assessment/Plan:  Prediabetes A1c remains elevated today. Will try lifestyle modifications of diet and exercise. Given also has other risk factors i.e. Is morbidly obese will also start metformin 500mg  daily for prevention   Tobacco abuse Discussed smoking cessation.  Vaginal discharge Wet mount did not  show yeast infection but given has elevated a1c and is still on antibiotics for another 2 days with history of yeast infection on antibiotics in the past, will send in diflucan in case patient needs it.  Health maintenance Will get records release from health department for previous vaccinations and pap smear results Will discuss contraception at next visit, provided information on IUDs to patient.  Dorothy HerElsia J Shailen Thielen, DO PGY-1, [facility] 08/19/2016 8:46 AM

## 2016-08-20 ENCOUNTER — Encounter: Payer: Self-pay | Admitting: Family Medicine

## 2016-08-29 ENCOUNTER — Encounter: Payer: Self-pay | Admitting: Family Medicine

## 2016-09-19 ENCOUNTER — Emergency Department (HOSPITAL_COMMUNITY): Payer: Medicaid Other

## 2016-09-19 ENCOUNTER — Encounter (HOSPITAL_COMMUNITY): Payer: Self-pay | Admitting: Nurse Practitioner

## 2016-09-19 DIAGNOSIS — R079 Chest pain, unspecified: Secondary | ICD-10-CM | POA: Diagnosis not present

## 2016-09-19 DIAGNOSIS — Z7984 Long term (current) use of oral hypoglycemic drugs: Secondary | ICD-10-CM | POA: Diagnosis not present

## 2016-09-19 DIAGNOSIS — E876 Hypokalemia: Secondary | ICD-10-CM | POA: Insufficient documentation

## 2016-09-19 DIAGNOSIS — I10 Essential (primary) hypertension: Secondary | ICD-10-CM | POA: Diagnosis not present

## 2016-09-19 DIAGNOSIS — F17210 Nicotine dependence, cigarettes, uncomplicated: Secondary | ICD-10-CM | POA: Insufficient documentation

## 2016-09-19 DIAGNOSIS — R0789 Other chest pain: Secondary | ICD-10-CM | POA: Diagnosis not present

## 2016-09-19 NOTE — ED Triage Notes (Signed)
Pt states she has been having intermittent chest pain for about a month now, also c/o left shoulder pain. Has no other complaints or associated sx.

## 2016-09-20 ENCOUNTER — Emergency Department (HOSPITAL_COMMUNITY)
Admission: EM | Admit: 2016-09-20 | Discharge: 2016-09-20 | Disposition: A | Discharge status: Home / Self Care | Payer: Medicaid Other | Attending: Emergency Medicine | Admitting: Emergency Medicine

## 2016-09-20 DIAGNOSIS — R079 Chest pain, unspecified: Secondary | ICD-10-CM

## 2016-09-20 DIAGNOSIS — E876 Hypokalemia: Secondary | ICD-10-CM

## 2016-09-20 LAB — BASIC METABOLIC PANEL
Anion gap: 12 (ref 5–15)
BUN: 13 mg/dL (ref 6–20)
CO2: 23 mmol/L (ref 22–32)
Calcium: 9.1 mg/dL (ref 8.9–10.3)
Chloride: 102 mmol/L (ref 101–111)
Creatinine, Ser: 0.73 mg/dL (ref 0.44–1.00)
GFR calc Af Amer: >60 mL/min (ref >60)
GFR calc non Af Amer: >60 mL/min (ref >60)
GLUCOSE: 125 mg/dL — AB (ref 65–99)
POTASSIUM: 3.1 mmol/L — AB (ref 3.5–5.1)
Sodium: 137 mmol/L (ref 135–145)

## 2016-09-20 LAB — CBC
HEMATOCRIT: 37.4 % (ref 36.0–46.0)
Hemoglobin: 12.7 g/dL (ref 12.0–15.0)
MCH: 28.7 pg (ref 26.0–34.0)
MCHC: 34 g/dL (ref 30.0–36.0)
MCV: 84.6 fL (ref 78.0–100.0)
Platelets: 446 10*3/uL — ABNORMAL HIGH (ref 150–400)
RBC: 4.42 MIL/uL (ref 3.87–5.11)
RDW: 12.6 % (ref 11.5–15.5)
WBC: 16.1 10*3/uL — ABNORMAL HIGH (ref 4.0–10.5)

## 2016-09-20 LAB — I-STAT TROPONIN, ED: Troponin i, poc: 0 ng/mL (ref 0.00–0.08)

## 2016-09-20 MED ORDER — NAPROXEN 500 MG PO TABS: 500.0000 mg | ORAL_TABLET | Freq: Two times a day (BID) | ORAL | 0 refills | Status: DC

## 2016-09-20 MED ORDER — POTASSIUM CHLORIDE CRYS ER 20 MEQ PO TBCR
40.0000 meq | EXTENDED_RELEASE_TABLET | Freq: Once | ORAL | Status: AC
  Administered 2016-09-20: 40 meq via ORAL
  Filled 2016-09-20: qty 2

## 2016-09-20 NOTE — Discharge Instructions (Signed)
Take your medication as prescribed for pain relief. I recommend scheduling a follow-up appointment with your primary care provider within the next week for follow-up evaluation regarding your chest pain. I also recommend having your blood work rechecked as her potassium was noted to be low in the ED. (K 3.1).  Please return to the Emergency Department if symptoms worsen or new onset of fever, headache, lightheadedness, dizziness, nasal worsening chest pain, difficulty breathing, vomiting, numbness, weakness.

## 2016-09-20 NOTE — ED Notes (Signed)
Blood draw unsuccessful RN notified. 

## 2016-09-20 NOTE — ED Provider Notes (Signed)
[facility]-EMERGENCY DEPT Provider Note   CSN: [id number] Arrival date & time: 09/19/16  2327     History   Chief Complaint Chief Complaint  Patient presents with  . Chest Pain  . Shoulder Pain    HPI Dorothy Haynes is a 31 y.o. female.  HPI   Patient is a 31 year old female with history of hypertension, DM and GERD who presents to the ED with complaints of chest pain. Patient reports over the past 1-2 months she has had intermittent chest pain. She reports the pain will occur anywhere across her anterior chest wall and typically presents as a heaviness. Denies any aggravating or relieving factors. Patient reports the pain typically lasts for a few minutes and then resolves spontaneously. She notes she began having intermittent chest pain this evening while she was at home resting and reports beginning to have associated intermittent tingling to her left upper arm and right foot. Patient denies having similar symptoms in the past. She denies currently having any symptoms at this time. Denies fever, chills, headache, lightheadedness, dizziness, visual changes, neck pain, cough, shortness of breath, wheezing, palpitations, abdominal pain, nausea, vomiting, redness, swelling, rash, weakness. Patient reports taking ibuprofen at home intermittently with intermittent relief of pain. She notes she has been seen in the ED before regarding her chest pain but denies ever following up with her PCP. Patient states that she thinks her symptoms are likely due to anxiety. She reports having increased stress over the past few weeks/months and attributes her symptoms to stress. Denies leg swelling, hx of DVT/PE, recent long car rides/plane travel, recent surgery/hospitalization, use of exogenous estrogen.  PCP-  Dr. Artist Pais  Past Medical History:  Diagnosis Date  . GERD (gastroesophageal reflux disease) 2009  . Gestational diabetes 2008  . Headache   . History of gallstones 10/2015   s/p cholecystectomy 10/09/15   . Hypertension   . Morbid obesity (HCC) 10/09/2015  . Shortness of breath dyspnea   . Tobacco abuse 10/09/2015    Patient Active Problem List   Diagnosis Date Noted  . Prediabetes 08/19/2016  . Vaginal discharge 08/19/2016  . Gallstones 10/09/2015  . Morbid obesity (HCC) 10/09/2015  . Tobacco abuse 10/09/2015    Past Surgical History:  Procedure Laterality Date  . CHOLECYSTECTOMY N/A 10/09/2015   Procedure: LAPAROSCOPIC CHOLECYSTECTOMY ;  Surgeon: Claud Kelp, MD;  Location: [facility];  Service: General;  Laterality: N/A;  . WISDOM TOOTH EXTRACTION      OB History    No data available       Home Medications    Prior to Admission medications   Medication Sig Start Date End Date Taking? Authorizing Provider  albuterol (PROVENTIL HFA;VENTOLIN HFA) 108 (90 Base) MCG/ACT inhaler Inhale 1-2 puffs into the lungs every 6 (six) hours as needed for wheezing or shortness of breath. 08/15/16  Yes Alexandra M Law, PA-C  azithromycin (ZITHROMAX) 250 MG tablet Take 1 tablet (250 mg total) by mouth daily. Take first 2 tablets together, then 1 every day until finished. Patient not taking: Reported on 09/20/2016 08/15/16   Emi Holes, PA-C  metFORMIN (GLUCOPHAGE) 500 MG tablet Take 1 tablet (500 mg total) by mouth daily. Patient not taking: Reported on 09/20/2016 08/19/16   Leland Her, DO  naproxen (NAPROSYN) 500 MG tablet Take 1 tablet (500 mg total) by mouth 2 (two) times daily. 09/20/16   Barrett Henle, PA-C    Family History Family History  Problem Relation Age of Onset  . Diabetes  Mother   . Hypertension Mother   . Throat cancer Father   . Kidney Stones Father   . Alzheimer's disease Other   . Diabetes Other   . Hypertension Other   . Heart disease Other   . Heart disease Other   . Diabetes Other   . Hypertension Other   . Thyroid disease Other   . Diabetes Maternal Grandmother   . Diabetes Maternal Grandfather     Social History Social History  Substance Use  Topics  . Smoking status: Current Every Day Smoker    Packs/day: 0.20    Years: 17.00    Types: Cigarettes  . Smokeless tobacco: Never Used  . Alcohol use Yes     Comment: occ, 1-2 drink per week     Allergies   Patient has no known allergies.   Review of Systems Review of Systems  Cardiovascular: Positive for chest pain.  Neurological:       Tingling  All other systems reviewed and are negative.    Physical Exam Updated Vital Signs BP 134/84   Pulse 105   Temp 98.7 F (37.1 C) (Oral)   Resp 18   SpO2 96%   Physical Exam  Constitutional: She is oriented to person, place, and time. She appears well-developed and well-nourished.  HENT:  Head: Normocephalic and atraumatic.  Mouth/Throat: Uvula is midline, oropharynx is clear and moist and mucous membranes are normal. No oropharyngeal exudate, posterior oropharyngeal edema, posterior oropharyngeal erythema or tonsillar abscesses. No tonsillar exudate.  Eyes: Conjunctivae and EOM are normal. Pupils are equal, round, and reactive to light. Right eye exhibits no discharge. Left eye exhibits no discharge. No scleral icterus.  Neck: Normal range of motion. Neck supple.  Cardiovascular: Normal rate, regular rhythm, normal heart sounds and intact distal pulses.   HR 96  Pulmonary/Chest: Effort normal and breath sounds normal. No respiratory distress. She has no wheezes. She has no rales. She exhibits no tenderness.  Abdominal: Soft. Bowel sounds are normal. She exhibits no distension and no mass. There is no tenderness. There is no rebound and no guarding. No hernia.  Musculoskeletal: Normal range of motion. She exhibits no edema, tenderness or deformity.  No midline C, T, or L tenderness. Full range of motion of neck and back. Full range of motion of bilateral upper and lower extremities, with 5/5 strength. Sensation intact. 2+ radial and PT pulses. Cap refill <2 seconds. Patient able to stand and ambulate without assistance.      Neurological: She is alert and oriented to person, place, and time. No sensory deficit.  Skin: Skin is warm and dry. Capillary refill takes less than 2 seconds. No rash noted.  Nursing note and vitals reviewed.    ED Treatments / Results  Labs (all labs ordered are listed, but only abnormal results are displayed) Labs Reviewed  BASIC METABOLIC PANEL - Abnormal; Notable for the following:       Result Value   Potassium 3.1 (*)    Glucose, Bld 125 (*)    All other components within normal limits  CBC - Abnormal; Notable for the following:    WBC 16.1 (*)    Platelets 446 (*)    All other components within normal limits  I-STAT TROPOININ, ED    EKG  EKG Interpretation  Date/Time:  Friday September 19 2016 23:34:45 EST Ventricular Rate:  101 PR Interval:    QRS Duration: 92 QT Interval:  353 QTC Calculation: 458 R Axis:   15  Text Interpretation:  Sinus tachycardia Baseline wander in lead(s) V1 V3 V4 V5 V6 When compared with ECG of 02/25/2010, No significant change was found Confirmed by [facility] CenterGLICK  MD, DAVID ([id number]) on 09/19/2016 11:38:53 PM       Radiology Dg Chest 2 View  Result Date: 09/20/2016 CLINICAL DATA:  Intermittent chest pain for 1 month. Shortness of breath. EXAM: CHEST  2 VIEW COMPARISON:  08/15/2016 FINDINGS: Lung volumes are low. The cardiomediastinal contours are normal. Pulmonary vasculature is normal. No consolidation, pleural effusion, or pneumothorax. No acute osseous abnormalities are seen. IMPRESSION: Low lung volumes without acute abnormality. Electronically Signed   By: Rubye OaksMelanie  Ehinger M.D.   On: 09/20/2016 00:30    Procedures Procedures (including critical care time)  Medications Ordered in ED Medications  potassium chloride SA (K-DUR,KLOR-CON) CR tablet 40 mEq (40 mEq Oral Given 09/20/16 0350)     Initial Impression / Assessment and Plan / ED Course  I have reviewed the triage vital signs and the nursing notes.  Pertinent labs & imaging results  that were available during my care of the patient were reviewed by me and considered in my medical decision making (see chart for details).     Patient presents with intermittent chest pain which she notes has been present for the past month. Also reports having intermittent tingling to her left upper arm and right foot that started earlier this evening. Patient attributes her symptoms to anxiety and notes she has had recent increase in stress over the past few weeks. Denies currently having any symptoms on my initial evaluation. VSS. Exam unremarkable. Lungs clear to auscultation bilaterally. Neuro exam unremarkable, patient with sensation grossly intact. No midline spinal tenderness. EKG showed NSR, HR 101. CXR negative. Trop negative. I have a low suspicion for ACS, PE, dissection, or other acute cardiac event at this time. Potassium 3.1, pt given oral potassium supplement in the ED. WBC 16.1, chart review shows pt with hx of baseline leukocytosis. Discussed results and plan for discharge with patient. Advised patient to follow up with her PCP this week for reevaluation regarding her intermittent chest pain. Advised her to have her potassium rechecked this week. Discussed strict return precautions.     Final Clinical Impressions(s) / ED Diagnoses   Final diagnoses:  Chest pain, unspecified type  Hypokalemia    New Prescriptions New Prescriptions   NAPROXEN (NAPROSYN) 500 MG TABLET    Take 1 tablet (500 mg total) by mouth 2 (two) times daily.     Satira Sarkicole Elizabeth BrenasNadeau, [place] JerseyPA-C 09/20/16 0413    Dione Boozeavid Glick, MD 09/20/16 [phone]

## 2016-09-26 ENCOUNTER — Ambulatory Visit (INDEPENDENT_AMBULATORY_CARE_PROVIDER_SITE_OTHER): Payer: Medicaid Other | Admitting: Internal Medicine

## 2016-09-26 ENCOUNTER — Encounter: Payer: Self-pay | Admitting: Internal Medicine

## 2016-09-26 ENCOUNTER — Other Ambulatory Visit: Payer: Self-pay | Admitting: Family Medicine

## 2016-09-26 VITALS — BP 126/84 | HR 88 | Temp 98.2°F | Wt 278.0 lb

## 2016-09-26 DIAGNOSIS — F419 Anxiety disorder, unspecified: Secondary | ICD-10-CM | POA: Diagnosis not present

## 2016-09-26 DIAGNOSIS — E876 Hypokalemia: Secondary | ICD-10-CM | POA: Diagnosis not present

## 2016-09-26 DIAGNOSIS — R0789 Other chest pain: Secondary | ICD-10-CM | POA: Diagnosis present

## 2016-09-26 MED ORDER — OMEPRAZOLE 20 MG PO CPDR: 20.0000 mg | DELAYED_RELEASE_CAPSULE | Freq: Every day | ORAL | 0 refills | Status: DC

## 2016-09-26 NOTE — Patient Instructions (Addendum)
It was so nice to meet you!  I have prescribed Prilosec. Please use this once daily.  Please schedule an appointment with your primary care doctor in the next few weeks.  -Dr. Nancy MarusMayo

## 2016-09-26 NOTE — Progress Notes (Signed)
[facility] Phone: [phone]  Subjective:  Dorothy Haynes is a 31 year old female presenting to clinic for an ED follow-up appointment. She was seen in the ED on 1/20 for chest pain and tingling in both arms. She states she thinks she was just feeling very anxious. She was also having palpitations and has had a lot of stress in her life, including 5 friends/family members that died in the past week. In the ED, her basic labs were normal other than a low potassium to 3.1 and an elevated WBC count to 16.1 (she seems to have some baseline leukocytosis). EKG showed sinus tachycardia. Trop negative. CXR was negative. She was given oral potassium and discharged home with Naproxen for presumed musculoskeletal chest pain.  Since being seen in the ED, she states she is still having chest pain that comes and goes. The chest pain is "sharp" and is located all throughout her chest. Nothing makes the chest pain worse. She does not have chest pain with exertion. The chest pain is better after belching. She endorses increased heartburn and belching over the last 2 weeks. She has been diagnosed with GERD in the past and was previously on a PPI, but stopped taking it at some point. She states she is still having anxiety and a lot of stress at home. She feels like she is pretty good at coping with stress.  ROS: See HPI for pertinent positives and negatives  Past Medical History- morbid obesity, tobacco use, prediabetes  Family history reviewed for today's visit. No changes.  Social history- patient is a current smoker  Objective: BP 126/84   Pulse 88   Temp 98.2 F (36.8 C) (Oral)   Wt 278 lb (126.1 kg)   SpO2 98%   BMI 49.25 kg/m  Gen: NAD, alert, cooperative with exam HEENT: NCAT, EOMI, MMM Neck: FROM, supple CV: RRR, no murmur, very mild tenderness to palpation of her sternum. Resp: CTABL, no wheezes, normal work of breathing GI: +BS, soft, no epigastric tenderness Msk: No  edema, warm, normal tone, moves UE/LE spontaneously Psych: Appropriate behavior, calm, normal rate of speech, no SI/HI  GAD-7: 5  Assessment/Plan: Atypical Chest Pain: Was recently seen in the ED and had normal EKG, negative trop, negative CXR. Think her chest pain is likely GI in etiology, given that she endorses increased belching and heartburn over the last 2-3 weeks. She does have a history of GERD, but is no longer taking a PPI. Her chest pain improves after belching. Her chest pain could also be anxiety-induced, as she notes increased stress recently (see separate problem). She could also have some mild costochondritis, as her sternum is mildly tender to palpation, although she does not have worsening pain with movement. - Will start Prilosec 20mg  daily x 4 weeks to see if this helps her pain - Pt was prescribed Naproxen by ED provider, but has not picked up the prescription yet. Advised that she try the Prilosec first. - Return precautions discussed - Follow-up with PCP in the next few weeks to monitor response to Prilosec.  Anxiety: Pt notes recent increase in stress and anxiety, as 5 family members/friends have died in the last week. She is having associated palpitations and chest pain. She feels that she copes well with stress. - GAD-7 score performed in clinic today is a 5, which corresponds to a mild anxiety disorder - We discussed that feeling anxious is a normal response to stressful life situations. - Pt does not feel  that she needs any medications or referral to therapy at this time, and I agree - Follow-up with PCP in the next few weeks to see how this is going. - Could consider getting integrated health involved if she is still feeling anxious  Hypokalemia: Pt noted to have K of 3.1 in the ED. Given K-dur in the ED. - Recheck BMP   Willadean Carol, MD PGY-2

## 2016-09-26 NOTE — Telephone Encounter (Signed)
omperazole was called into the wrong pharmacy. Pt uses [facility] on [address]. ep

## 2016-09-26 NOTE — Telephone Encounter (Signed)
Medication resent to appropriate pharmacy. Jazmin Hartsell,CMA

## 2016-09-28 DIAGNOSIS — R0789 Other chest pain: Secondary | ICD-10-CM | POA: Insufficient documentation

## 2016-09-28 DIAGNOSIS — E876 Hypokalemia: Secondary | ICD-10-CM | POA: Insufficient documentation

## 2016-09-28 DIAGNOSIS — F419 Anxiety disorder, unspecified: Secondary | ICD-10-CM | POA: Insufficient documentation

## 2016-09-28 NOTE — Assessment & Plan Note (Signed)
Pt noted to have K of 3.1 in the ED. Given K-dur in the ED. - Recheck BMP

## 2016-09-28 NOTE — Assessment & Plan Note (Signed)
Pt notes recent increase in stress and anxiety, as 5 family members/friends have died in the last week. She is having associated palpitations and chest pain. She feels that she copes well with stress. - GAD-7 score performed in clinic today is a 5, which corresponds to a mild anxiety disorder - We discussed that feeling anxious is a normal response to stressful life situations. - Pt does not feel that she needs any medications or referral to therapy at this time, and I agree - Follow-up with PCP in the next few weeks to see how this is going. - Could consider getting integrated health involved if she is still feeling anxious

## 2016-09-28 NOTE — Assessment & Plan Note (Signed)
Was recently seen in the ED and had normal EKG, negative trop, negative CXR. Think her chest pain is likely GI in etiology, given that she endorses increased belching and heartburn over the last 2-3 weeks. She does have a history of GERD, but is no longer taking a PPI. Her chest pain improves after belching. Her chest pain could also be anxiety-induced, as she notes increased stress recently (see separate problem). She could also have some mild costochondritis, as her sternum is mildly tender to palpation, although she does not have worsening pain with movement. - Will start Prilosec 20mg  daily x 4 weeks to see if this helps her pain - Pt was prescribed Naproxen by ED provider, but has not picked up the prescription yet. Advised that she try the Prilosec first. - Return precautions discussed - Follow-up with PCP in the next few weeks to monitor response to Prilosec.

## 2016-09-30 ENCOUNTER — Other Ambulatory Visit: Payer: Medicaid Other

## 2016-09-30 DIAGNOSIS — E876 Hypokalemia: Secondary | ICD-10-CM

## 2016-09-30 LAB — BASIC METABOLIC PANEL WITH GFR
BUN: 10 mg/dL (ref 7–25)
CALCIUM: 9.1 mg/dL (ref 8.6–10.2)
CO2: 27 mmol/L (ref 20–31)
Chloride: 103 mmol/L (ref 98–110)
Creat: 0.64 mg/dL (ref 0.50–1.10)
GFR, Est Non African American: >89 mL/min (ref >=60)
GLUCOSE: 118 mg/dL — AB (ref 65–99)
Potassium: 3.6 mmol/L (ref 3.5–5.3)
Sodium: 138 mmol/L (ref 135–146)

## 2016-10-01 ENCOUNTER — Telehealth: Payer: Self-pay | Admitting: *Deleted

## 2016-10-01 NOTE — Telephone Encounter (Signed)
-----   Message from Campbell StallKaty Dodd Mayo, MD sent at 10/01/2016  8:37 AM EST ----- Please let Ms. Rutten know that her potassium level was normal.

## 2016-10-01 NOTE — Telephone Encounter (Signed)
LM ok per DPR that potassium was normal.  Jazmin Hartsell,CMA

## 2016-10-23 ENCOUNTER — Ambulatory Visit (INDEPENDENT_AMBULATORY_CARE_PROVIDER_SITE_OTHER): Payer: Medicaid Other | Admitting: Family Medicine

## 2016-10-23 ENCOUNTER — Encounter: Payer: Self-pay | Admitting: Family Medicine

## 2016-10-23 VITALS — BP 122/78 | HR 93 | Temp 97.3°F | Ht 63.0 in | Wt 280.0 lb

## 2016-10-23 DIAGNOSIS — N921 Excessive and frequent menstruation with irregular cycle: Secondary | ICD-10-CM

## 2016-10-23 DIAGNOSIS — N926 Irregular menstruation, unspecified: Secondary | ICD-10-CM

## 2016-10-23 DIAGNOSIS — R7303 Prediabetes: Secondary | ICD-10-CM

## 2016-10-23 DIAGNOSIS — R12 Heartburn: Secondary | ICD-10-CM | POA: Diagnosis not present

## 2016-10-23 HISTORY — DX: Excessive and frequent menstruation with irregular cycle: N92.1

## 2016-10-23 LAB — POCT URINE PREGNANCY: PREG TEST UR: NEGATIVE

## 2016-10-23 MED ORDER — RANITIDINE HCL 150 MG PO TABS: 150.0000 mg | ORAL_TABLET | Freq: Two times a day (BID) | ORAL | 0 refills | Status: DC

## 2016-10-23 NOTE — Patient Instructions (Signed)
It was great to see you again!  For your heartburn and prediabetes,  - Please avoid greasy or spicy foods. Try and eat at regular mealtimes and avoid eating in the late evening/night.   Diet Recommendations for Diabetes   Starchy (carb) foods: Bread, rice, pasta, potatoes, corn, cereal, grits, crackers, bagels, muffins, all baked goods.  (Fruits, milk, and yogurt also have carbohydrate, but most of these foods will not spike your blood sugar as most starchy foods will.)  A few fruits do cause high blood sugars; use small portions of bananas (limit to 1/2 at a time), grapes, watermelon, oranges, and most tropical fruits.    Protein foods: Meat, fish, poultry, eggs, dairy foods, and beans such as pinto and kidney beans (beans also provide carbohydrate).   1. Eat at least 3 meals and 1-2 snacks per day. Never go more than 4-5 hours while awake without eating. Eat breakfast within the first hour of getting up.   2. Limit starchy foods to TWO per meal and ONE per snack. ONE portion of a starchy  food is equal to the following:   - ONE slice of bread (or its equivalent, such as half of a hamburger bun).   - 1/2 cup of a "scoopable" starchy food such as potatoes or rice.   - 15 grams of Total Carbohydrate as shown on food label.  3. Include at every meal: a protein food, a carb food, and vegetables and/or fruit.   - Obtain twice the volume of veg's as protein or carbohydrate foods for both lunch and dinner.   - Fresh or frozen veg's are best.   - Keep frozen veg's on hand for a quick vegetable serving.        Take care and seek immediate care sooner if you develop any concerns.   Dr. Leland HerElsia J Trashaun Streight, DO [facility]

## 2016-10-23 NOTE — Assessment & Plan Note (Signed)
Patient to try lifestyle modifications including avoidance of spicy or greasy foods and no eating past 8pm. If this is ineffective she states she is willing to try medication so provided her with ranitidine that she can start at home.

## 2016-10-23 NOTE — Progress Notes (Signed)
    Subjective:  Dorothy Haynes is a 31 y.o. female [facility] up on [facility].  HPI:  Reflux Recently seen for ED follow up for atypical chest pain, was started on omeprazole. Patient states only took one dose since she does not like taking pills. States has continued belching and heartburn sensation similar to reflux she has had in the past. She states she previously managed her heartburn with lifestyle modifications: avoidance of greasy foods and no late night snacking. She states that she has plans to eat healthier by grilling and doing meal prep once a week.  Prediabetes Was prescribed metformin in December but took 2 doses and stopped due to a desire to not take pills. Would like to try healthy eating as per above and exercise. No abd pain, n/v, diarrhea.  Missed periods Has not had a period since November when she stopped getting depo injections. Has been on depo for 10+ years and before that she was pregnant with depo between pregnancies. The last time she was off birth control was age 31 and she did have regular periods at that time. Has been sexually active recently.   ROS: Per HPI  Objective:  Physical Exam: BP 122/78   Pulse 93   Temp 97.3 F (36.3 C) (Oral)   Ht 5\' 3"  (1.6 m)   Wt 280 lb (127 kg)   SpO2 90%   BMI 49.60 kg/m   Gen: NAD, resting comfortably CV: RRR with no murmurs appreciated Pulm: NWOB, CTAB with no crackles, wheezes, or rhonchi GI: Normal bowel sounds present. Soft, Nontender, Nondistended. MSK: no edema, cyanosis, or clubbing noted Skin: warm, dry Neuro: grossly normal, moves all extremities Psych: Normal affect and thought content  Results for orders placed or performed in visit on 10/23/16 (from the past 72 hour(s))  POCT urine pregnancy     Status: None   Collection Time: 10/23/16  4:24 PM  Result Value Ref Range   Preg Test, Ur Negative Negative     Assessment/Plan:  Prediabetes Patient is resistant to  retrialing metformin, not out of intolerance but rather because she did not enjoy taking pills. Voiced good understanding that metformin could be protective and preventative for diabetes but prefers to continue in her efforts to eat healthier and exercise. Provided handout to be mindful of her carb intake.  Heartburn Patient to try lifestyle modifications including avoidance of spicy or greasy foods and no eating past 8pm. If this is ineffective she states she is willing to try medication so provided her with ranitidine that she can start at home.  Missed periods Likely to be from cessation of depo in November since UPT neg today. Possible PCOS since is obese but does remember regular periods remotely in the past. Patient is interested in IUD placement and she would be a good candidate for Mirena but will need to schedule an appointment and perhaps do PCOS workup at the same time. Will call patient back to schedule IUD placement.   Dorothy HerElsia J Sharvil Hoey, DO PGY-1,  Family Medicine 10/23/2016 8:05 PM

## 2016-10-23 NOTE — Assessment & Plan Note (Signed)
Likely to be from cessation of depo in November since UPT neg today. Possible PCOS since is obese but does remember regular periods remotely in the past. Patient is interested in IUD placement and she would be a good candidate for Mirena but will need to schedule an appointment and perhaps do PCOS workup at the same time. Will call patient back to schedule IUD placement.

## 2016-10-23 NOTE — Assessment & Plan Note (Addendum)
Patient is resistant to retrialing metformin, not out of intolerance but rather because she did not enjoy taking pills. Voiced good understanding that metformin could be protective and preventative for diabetes but prefers to continue in her efforts to eat healthier and exercise. Provided handout to be mindful of her carb intake.

## 2017-01-20 ENCOUNTER — Encounter: Payer: Self-pay | Admitting: Family Medicine

## 2017-01-20 ENCOUNTER — Ambulatory Visit (INDEPENDENT_AMBULATORY_CARE_PROVIDER_SITE_OTHER): Payer: Medicaid Other | Admitting: Family Medicine

## 2017-01-20 VITALS — BP 128/82 | HR 101 | Temp 98.2°F | Ht 63.0 in | Wt 274.0 lb

## 2017-01-20 DIAGNOSIS — N938 Other specified abnormal uterine and vaginal bleeding: Secondary | ICD-10-CM | POA: Diagnosis not present

## 2017-01-20 DIAGNOSIS — R739 Hyperglycemia, unspecified: Secondary | ICD-10-CM | POA: Diagnosis not present

## 2017-01-20 LAB — POCT GLYCOSYLATED HEMOGLOBIN (HGB A1C): HEMOGLOBIN A1C: 6.2

## 2017-01-20 LAB — POCT URINE PREGNANCY: PREG TEST UR: NEGATIVE

## 2017-01-20 NOTE — Patient Instructions (Signed)
Please take Naproxen 500mg  twice daily. We will obtain labs today. Please return in one week if no improvement.  Dr. Caroleen Hammanumley   Dysfunctional Uterine Bleeding Dysfunctional uterine bleeding is abnormal bleeding from the uterus. Dysfunctional uterine bleeding includes:  A period that comes earlier or later than usual.  A period that is lighter, heavier, or has blood clots.  Bleeding between periods.  Skipping one or more periods.  Bleeding after sexual intercourse.  Bleeding after menopause. Follow these instructions at home: Pay attention to any changes in your symptoms. Follow these instructions to help with your condition: Eating and drinking   Eat well-balanced meals. Include foods that are high in iron, such as liver, meat, shellfish, green leafy vegetables, and eggs.  If you become constipated:  Drink plenty of water.  Eat fruits and vegetables that are high in water and fiber, such as spinach, carrots, raspberries, apples, and mango. Medicines   Take over-the-counter and prescription medicines only as told by your health care provider.  Do not change medicines without talking with your health care provider.  Aspirin or medicines that contain aspirin may make the bleeding worse. Do not take those medicines:  During the week before your period.  During your period.  If you were prescribed iron pills, take them as told by your health care provider. Iron pills help to replace iron that your body loses because of this condition. Activity   If you need to change your sanitary pad or tampon more than one time every 2 hours:  Lie in bed with your feet raised (elevated).  Place a cold pack on your lower abdomen.  Rest as much as possible until the bleeding stops or slows down.  Do not try to lose weight until the bleeding has stopped and your blood iron level is back to normal. Other Instructions   For two months, write down:  When your period starts.  When your  period ends.  When any abnormal bleeding occurs.  What problems you notice.  Keep all follow up visits as told by your health care provider. This is important. Contact a health care provider if:  You get light-headed or weak.  You have nausea and vomiting.  You cannot eat or drink without vomiting.  You feel dizzy or have diarrhea while you are taking medicines.  You are taking birth control pills or hormones, and you want to change them or stop taking them. Get help right away if:  You develop a fever or chills.  You need to change your sanitary pad or tampon more than one time per hour.  Your bleeding becomes heavier, or your flow contains clots more often.  You develop pain in your abdomen.  You lose consciousness.  You develop a rash. This information is not intended to replace advice given to you by your health care provider. Make sure you discuss any questions you have with your health care provider. Document Released: 08/15/2000 Document Revised: 01/24/2016 Document Reviewed: 11/13/2014 Elsevier Interactive Patient Education  2017 [employer]

## 2017-01-21 LAB — CBC
Hematocrit: 36.8 % (ref 34.0–46.6)
Hemoglobin: 12.1 g/dL (ref 11.1–15.9)
MCH: 29.4 pg (ref 26.6–33.0)
MCHC: 32.9 g/dL (ref 31.5–35.7)
MCV: 90 fL (ref 79–97)
PLATELETS: 436 10*3/uL — AB (ref 150–379)
RBC: 4.11 x10E6/uL (ref 3.77–5.28)
RDW: 13.9 % (ref 12.3–15.4)
WBC: 16.1 10*3/uL — AB (ref 3.4–10.8)

## 2017-01-21 LAB — TESTOSTERONE: Testosterone: 10 ng/dL (ref 8–48)

## 2017-01-21 LAB — TSH: TSH: 1.38 u[IU]/mL (ref 0.450–4.500)

## 2017-01-21 NOTE — Progress Notes (Signed)
Subjective:     Patient ID: Dorothy Haynes, female   DOB: 05/10/1986, 31 y.o.   MRN: [id number]  HPI [name] is a 31yo female presenting today for prolonged menstrual bleeding. Reports several months ago she was on Depo and stopped having periods all together. She stopped Depo in November 2017. Today, reports her menstrual period has returned, but has been present for 3 weeks. Very light at night, requiring only one sanitary pad, but during the day is heavier and she goes through 2 sanitary pads daily. Occasional mild abdominal cramping, but rare. Notes occasional fatigue and weakness, but denies shortness of breath and chest pain. Reports she is a "hairy person." Diagnosis of PCOS has been considered in past, but has never been evaluated. Is currently sexually active without birth control. Smoker.  Review of Systems Per HPI    Objective:   Physical Exam  Constitutional: She appears well-developed and well-nourished. No distress.  Eyes:  Pink conjunctiva  Neck: No thyromegaly present.  Cardiovascular: Normal rate and regular rhythm.   No murmur heard. Pulmonary/Chest: Effort normal. No respiratory distress. She has no wheezes.  Abdominal: Soft. She exhibits no distension. There is no tenderness.  Musculoskeletal: She exhibits no edema.  Skin: No rash noted.  Psychiatric: She has a normal mood and affect. Her behavior is normal.      Assessment and Plan:     1. Dysfunctional uterine bleeding Previous concern for PCOS given obesity and hyperglycemia. Will obtain Testosterone level, FSH, TSH, CBC, and A1C. Will obtain urine pregnancy test. Does smoke and increased risk for clot given age, so will hold on treatment with OCP. Will instead do trial of NSAIDs with Naproxen 500mg  twice daily. To follow up in one week if no improvement. Defers pelvic exam, but is aware she is due for pap smear--attempt to obtain at next office visit.

## 2017-02-10 ENCOUNTER — Encounter (HOSPITAL_COMMUNITY): Payer: Self-pay | Admitting: Emergency Medicine

## 2017-02-10 ENCOUNTER — Emergency Department (HOSPITAL_COMMUNITY): Payer: Medicaid Other

## 2017-02-10 ENCOUNTER — Emergency Department (HOSPITAL_COMMUNITY)
Admission: EM | Admit: 2017-02-10 | Discharge: 2017-02-10 | Disposition: A | Discharge status: Home / Self Care | Payer: Medicaid Other | Attending: Emergency Medicine | Admitting: Emergency Medicine

## 2017-02-10 DIAGNOSIS — J069 Acute upper respiratory infection, unspecified: Secondary | ICD-10-CM | POA: Diagnosis not present

## 2017-02-10 DIAGNOSIS — I10 Essential (primary) hypertension: Secondary | ICD-10-CM | POA: Diagnosis not present

## 2017-02-10 DIAGNOSIS — Z79899 Other long term (current) drug therapy: Secondary | ICD-10-CM | POA: Insufficient documentation

## 2017-02-10 DIAGNOSIS — F17210 Nicotine dependence, cigarettes, uncomplicated: Secondary | ICD-10-CM | POA: Insufficient documentation

## 2017-02-10 DIAGNOSIS — R05 Cough: Secondary | ICD-10-CM | POA: Diagnosis present

## 2017-02-10 MED ORDER — AZITHROMYCIN 250 MG PO TABS: 250.0000 mg | ORAL_TABLET | Freq: Every day | ORAL | 0 refills | Status: DC

## 2017-02-10 MED ORDER — OXYMETAZOLINE HCL 0.05 % NA SOLN: 1.0000 | Freq: Two times a day (BID) | NASAL | 0 refills | Status: DC

## 2017-02-10 MED ORDER — BENZONATATE 100 MG PO CAPS: 200.0000 mg | ORAL_CAPSULE | Freq: Two times a day (BID) | ORAL | 0 refills | Status: DC | PRN

## 2017-02-10 NOTE — Discharge Instructions (Signed)
Take your medications as prescribed. Continue drinking fluids at home to remain hydrated. I recommend following up with your primary care provider in the next 4-5 days if symptoms have not improved. I also recommend following up with your primary care provider within the next week to have your blood pressure rechecked as it was noted to be elevated in the ED today. Return to the emergency department if symptoms worsen or new onset of fever, facial swelling, neck stiffness, difficulty breathing, coughing up blood, chest pain, vomiting.

## 2017-02-10 NOTE — ED Provider Notes (Signed)
[facility]-EMERGENCY DEPT Provider Note   CSN: [id number] Arrival date & time: 02/10/17  1111  By signing my name below, I, Diona BrownerJennifer Gorman, attest that this documentation has been prepared under the direction and in the presence of Melburn HakeNicole Nadeau, PA-C. Electronically Signed: Diona BrownerJennifer Gorman, ED Scribe. 02/10/17. 11:34 AM.  History   Chief Complaint Chief Complaint  Patient presents with  . Cough    HPI Dorothy Haynes is a 31 y.o. female with a no pertinent PMH who presents to the Emergency Department complaining of a productive cough with green sputum for the last week that worsened this past weekend. Associated sx include rhinorrhea, nasal congestion, sore throat, sinus pressure, wheezing. Pt saw her PCP last week for sx and was told her sxs were related to allergies.  Pt has not tried OTC medication. Denies recent antibiotic use. She has been drinking moonshine for her sx with no relief. She smokes ~ 3-4 cigarettes a day. Pt denies fever, facial swelling, neck pain/stiffness, ear pain, CP,SOB, hemoptysis, vomiting, and nausea. Denies any known sick contacts.   The history is provided by the patient. No language interpreter was used.    Past Medical History:  Diagnosis Date  . GERD (gastroesophageal reflux disease) 2009  . Gestational diabetes 2008  . Headache   . History of gallstones 10/2015   s/p cholecystectomy 10/09/15  . Hypertension   . Morbid obesity (HCC) 10/09/2015  . Shortness of breath dyspnea   . Tobacco abuse 10/09/2015    Patient Active Problem List   Diagnosis Date Noted  . Heartburn 10/23/2016  . Missed periods 10/23/2016  . Atypical chest pain 09/28/2016  . Anxiety, mild 09/28/2016  . Prediabetes 08/19/2016  . Gallstones 10/09/2015  . Morbid obesity (HCC) 10/09/2015  . Tobacco abuse 10/09/2015    Past Surgical History:  Procedure Laterality Date  . CHOLECYSTECTOMY N/A 10/09/2015   Procedure: LAPAROSCOPIC CHOLECYSTECTOMY ;  Surgeon: Claud KelpHaywood Ingram, MD;   Location: [facility] ORS;  Service: General;  Laterality: N/A;  . WISDOM TOOTH EXTRACTION      OB History    No data available       Home Medications    Prior to Admission medications   Medication Sig Start Date End Date Taking? Authorizing Provider  albuterol (PROVENTIL HFA;VENTOLIN HFA) 108 (90 Base) MCG/ACT inhaler Inhale 1-2 puffs into the lungs every 6 (six) hours as needed for wheezing or shortness of breath. 08/15/16   Law, Waylan BogaAlexandra M, PA-C  azithromycin (ZITHROMAX) 250 MG tablet Take 1 tablet (250 mg total) by mouth daily. Take first 2 tablets together, then 1 every day until finished. 02/10/17   Barrett HenleNadeau, Nicole Elizabeth, PA-C  benzonatate (TESSALON) 100 MG capsule Take 2 capsules (200 mg total) by mouth 2 (two) times daily as needed for cough. 02/10/17   Barrett HenleNadeau, Nicole Elizabeth, PA-C  naproxen (NAPROSYN) 500 MG tablet Take 1 tablet (500 mg total) by mouth 2 (two) times daily. 09/20/16   Barrett HenleNadeau, Nicole Elizabeth, PA-C  oxymetazoline (AFRIN NASAL SPRAY) 0.05 % nasal spray Place 1 spray into both nostrils 2 (two) times daily. Spray once into each nostril twice daily for up to the next 3 days. Do not use for more than 3 days to prevent rebound rhinorrhea. 02/10/17   Barrett HenleNadeau, Nicole Elizabeth, PA-C  ranitidine (ZANTAC) 150 MG tablet Take 1 tablet (150 mg total) by mouth 2 (two) times daily. 10/23/16   Leland HerYoo, Elsia J, DO    Family History Family History  Problem Relation Age of Onset  .  Diabetes Mother   . Hypertension Mother   . Throat cancer Father   . Kidney Stones Father   . Alzheimer's disease Other   . Diabetes Other   . Hypertension Other   . Heart disease Other   . Heart disease Other   . Diabetes Other   . Hypertension Other   . Thyroid disease Other   . Diabetes Maternal Grandmother   . Diabetes Maternal Grandfather     Social History Social History  Substance Use Topics  . Smoking status: Current Every Day Smoker    Packs/day: 0.20    Years: 17.00    Types: Cigarettes    . Smokeless tobacco: Never Used  . Alcohol use Yes     Comment: occ, 1-2 drink per week     Allergies   Patient has no known allergies.   Review of Systems Review of Systems  Constitutional: Negative for fever.  HENT: Positive for congestion, rhinorrhea, sinus pressure and sore throat. Negative for ear pain.   Respiratory: Positive for cough and wheezing. Negative for shortness of breath.   Cardiovascular: Negative for chest pain.  Gastrointestinal: Negative for abdominal pain, nausea and vomiting.  All other systems reviewed and are negative.    Physical Exam Updated Vital Signs BP (!) 152/110   Pulse 98   Temp 98.7 F (37.1 C) (Oral)   Resp 18   Ht 5\' 3"  (1.6 m)   Wt 124.7 kg (275 lb)   LMP 01/27/2017   SpO2 98%   BMI 48.71 kg/m   Physical Exam  Constitutional: She is oriented to person, place, and time. She appears well-developed and well-nourished.  HENT:  Head: Normocephalic and atraumatic.  Right Ear: Tympanic membrane normal.  Left Ear: Tympanic membrane normal.  Nose: Rhinorrhea present. Right sinus exhibits no maxillary sinus tenderness and no frontal sinus tenderness. Left sinus exhibits no maxillary sinus tenderness and no frontal sinus tenderness.  Mouth/Throat: Uvula is midline, oropharynx is clear and moist and mucous membranes are normal. No oropharyngeal exudate, posterior oropharyngeal edema, posterior oropharyngeal erythema or tonsillar abscesses. No tonsillar exudate.  No trismus, drooling, facial/neck swelling or stridor on exam. No muffled voice. Floor of mouth soft.  No facial or neck swelling.  Eyes: Conjunctivae and EOM are normal. Right eye exhibits no discharge. Left eye exhibits no discharge. No scleral icterus.  Neck: Normal range of motion. Neck supple.  Cardiovascular: Normal rate, regular rhythm, normal heart sounds and intact distal pulses.   Pulmonary/Chest: Effort normal and breath sounds normal. No respiratory distress. She has no  wheezes. She has no rales. She exhibits no tenderness.  Abdominal: Soft. She exhibits no distension.  Musculoskeletal: Normal range of motion. She exhibits no edema.  Lymphadenopathy:    She has no cervical adenopathy.  Neurological: She is alert and oriented to person, place, and time.  Skin: Skin is warm and dry.  Nursing note and vitals reviewed.    ED Treatments / Results  DIAGNOSTIC STUDIES: Oxygen Saturation is 99% on RA, normal by my interpretation.   COORDINATION OF CARE: 11:34 AM-Discussed next steps with pt which includes a CXR. Pt verbalized understanding and is agreeable with the plan.   Labs (all labs ordered are listed, but only abnormal results are displayed) Labs Reviewed - No data to display  EKG  EKG Interpretation None       Radiology Dg Chest 2 View  Result Date: 02/10/2017 CLINICAL DATA:  Cough and sore throat. EXAM: CHEST  2 VIEW COMPARISON:  09/19/2016 FINDINGS: The lungs are clear without focal pneumonia, edema, pneumothorax or pleural effusion. The cardiopericardial silhouette is within normal limits for size. The visualized bony structures of the thorax are intact. IMPRESSION: No active cardiopulmonary disease. Electronically Signed   By: Kennith Center M.D.   On: 02/10/2017 11:49    Procedures Procedures (including critical care time)  Medications Ordered in ED Medications - No data to display   Initial Impression / Assessment and Plan / ED Course  I have reviewed the triage vital signs and the nursing notes.  Pertinent labs & imaging results that were available during my care of the patient were reviewed by me and considered in my medical decision making (see chart for details).     Patient presents with worsening upper respiratory symptoms for the past week and a half. Endorses associated productive cough with green sputum. Denies fever, chest pain, shortness of breath. VSS. Exam revealed rhinorrhea and nasal congestion. Clear oral  pharynx. Lungs clear to auscultation bilaterally. Remaining exam unremarkable. Chest x-ray negative. Suspect patient's symptoms are likely due to URI versus sinusitis. Due to patient with worsening of symptoms over the past week will discharged home with antibiotics along with decongestant and antitussive. Advised patient to follow up with PCP as needed. Discussed return precautions.  Final Clinical Impressions(s) / ED Diagnoses   Final diagnoses:  Upper respiratory tract infection, unspecified type    New Prescriptions Discharge Medication List as of 02/10/2017 11:57 AM    START taking these medications   Details  azithromycin (ZITHROMAX) 250 MG tablet Take 1 tablet (250 mg total) by mouth daily. Take first 2 tablets together, then 1 every day until finished., Starting Tue 02/10/2017, Print    benzonatate (TESSALON) 100 MG capsule Take 2 capsules (200 mg total) by mouth 2 (two) times daily as needed for cough., Starting Tue 02/10/2017, Print    oxymetazoline (AFRIN NASAL SPRAY) 0.05 % nasal spray Place 1 spray into both nostrils 2 (two) times daily. Spray once into each nostril twice daily for up to the next 3 days. Do not use for more than 3 days to prevent rebound rhinorrhea., Starting Tue 02/10/2017, Print       I personally performed the services described in this documentation, which was scribed in my presence. The recorded information has been reviewed and is accurate.    Barrett Henle, PA-C 02/10/17 1209    Benjiman Core, MD 02/10/17 1616

## 2017-02-10 NOTE — ED Triage Notes (Signed)
Patient reports productive cough with sore throat since Friday. Denies chest pain and SOB.

## 2017-03-20 ENCOUNTER — Other Ambulatory Visit (HOSPITAL_COMMUNITY)
Admission: RE | Admit: 2017-03-20 | Discharge: 2017-03-20 | Disposition: A | Discharge status: Home / Self Care | Payer: Medicaid Other | Source: Ambulatory Visit | Attending: Family Medicine | Admitting: Family Medicine

## 2017-03-20 ENCOUNTER — Ambulatory Visit (INDEPENDENT_AMBULATORY_CARE_PROVIDER_SITE_OTHER): Payer: Medicaid Other | Admitting: Family Medicine

## 2017-03-20 ENCOUNTER — Encounter: Payer: Self-pay | Admitting: Family Medicine

## 2017-03-20 DIAGNOSIS — R7303 Prediabetes: Secondary | ICD-10-CM

## 2017-03-20 DIAGNOSIS — L732 Hidradenitis suppurativa: Secondary | ICD-10-CM | POA: Diagnosis not present

## 2017-03-20 DIAGNOSIS — Z720 Tobacco use: Secondary | ICD-10-CM | POA: Diagnosis not present

## 2017-03-20 DIAGNOSIS — Z124 Encounter for screening for malignant neoplasm of cervix: Secondary | ICD-10-CM | POA: Insufficient documentation

## 2017-03-20 MED ORDER — DOXYCYCLINE HYCLATE 100 MG PO TABS: 100.0000 mg | ORAL_TABLET | Freq: Two times a day (BID) | ORAL | 0 refills | Status: DC

## 2017-03-20 NOTE — Patient Instructions (Signed)
It was great to see you again!  For your pimple - take doxycycline for 5 days and follow up with me to see if it needs to be lanced. We should avoid opening it up if at all possible  Thank you for getting your pap smear today. Please follow up with me in 2-4 weeks to reassess the pimple and talk more about your snoring.  Take care,  Dr. Leland HerElsia J Arhianna Ebey, DO [facility]

## 2017-03-20 NOTE — Progress Notes (Signed)
    Subjective:  Dorothy Haynes is a 31 y.o. female who [facility] for [facility] exam  HPI:  Smoking - still smoking 3 cigarettes/days mostly at night - "fish tables" gambling at night, that's when she smokes, feels like she could be successful at quitting when she is ready because she will go the whole day without cravings. - states its a "mind" thing when asked about her readiness to quit. She wants to quit for her health but is not quite ready yet to take action.   Weight  - Does not exercise, sleeps all day.  - Friend has fitness program, plans to get involved to regular exercise - Does not watch what she eats because she does not meal plan. She states that she shops heathy at the grocery stores but that she will ignore her refrigerator and order whatever she most craves at the time which is most often unhealthy food like fried chicken.  - Drinks lots of soda and juice. She would like to give up soda but would not give up juice in favor of water at this time.  Pimple - small pimple on sternum, has tried to express at home without success - previously has had similar cysts in the same location in the past and under breasts. one was significant enough that she went to the ED and had it lanced - tender to touch but not red. No odor. Not worsening. - no fever/chills, no rashes   ROS: Per HPI   PMH:  - GERD - HTN - Obesity - Prediabetes - Tobacco abuse   Objective:  Physical Exam: BP 132/80   Pulse 100   Temp 98.8 F (37.1 C) (Oral)   Wt 279 lb (126.6 kg)   LMP 03/17/2017   SpO2 98%   BMI 49.42 kg/m   Gen: NAD, resting comfortably CV: RRR with no murmurs appreciated Pulm: NWOB, CTAB with no crackles, wheezes, or rhonchi GI: Normal bowel sounds present. Soft, Nontender, Nondistended. GU: normal female. No lesions.  MSK: no edema, cyanosis, or clubbing noted Skin: small palpable round nodule over lower sternum, approximate 0.5cm in diameter. No  overlying redness or warmth.  Neuro: grossly normal, moves all extremities Psych: Normal affect and thought content   Assessment/Plan:  Morbid obesity (HCC) Discussed healthy diet and exercise today.  - Patient has plans to start exercise regimen with her friend who runs a fitness program. - Will start with small steps to improve diet, first step is to give up soda. Not willing to make major changes in her eating at this time.  Tobacco abuse Patient declined smoking cessation aids at this time. Still in contemplative stage at this time. Voiced good understanding that can help with resources when ready.  Hidradenitis Given patient's history of similar nodules in intertriginous areas, suspect hidradenitis.  - doxycycline for 5 days - discussed good hygiene and avoidance of skin trauma  Health maintenance - pap collected today  - will discuss TDAP at next visit  Leland HerElsia J Loella Hickle, DO PGY-2, [facility] 03/20/2017 3:53 PM

## 2017-03-22 DIAGNOSIS — L732 Hidradenitis suppurativa: Secondary | ICD-10-CM | POA: Insufficient documentation

## 2017-03-22 NOTE — Assessment & Plan Note (Signed)
Discussed healthy diet and exercise today.  - Patient has plans to start exercise regimen with her friend who runs a fitness program. - Will start with small steps to improve diet, first step is to give up soda. Not willing to make major changes in her eating at this time.

## 2017-03-22 NOTE — Assessment & Plan Note (Signed)
Given patient's history of similar nodules in intertriginous areas, suspect hidradenitis.  - doxycycline for 5 days - discussed good hygiene and avoidance of skin trauma

## 2017-03-22 NOTE — Assessment & Plan Note (Addendum)
Patient declined smoking cessation aids at this time. Still in contemplative stage at this time. Voiced good understanding that can help with resources when ready.

## 2017-03-24 ENCOUNTER — Encounter: Payer: Self-pay | Admitting: Family Medicine

## 2017-03-24 LAB — CYTOLOGY - PAP
ADEQUACY: ABSENT
Chlamydia: NEGATIVE
Diagnosis: NEGATIVE
HPV (WINDOPATH): NOT DETECTED
Neisseria Gonorrhea: NEGATIVE

## 2017-04-20 ENCOUNTER — Encounter: Payer: Self-pay | Admitting: Family Medicine

## 2017-04-20 ENCOUNTER — Ambulatory Visit (INDEPENDENT_AMBULATORY_CARE_PROVIDER_SITE_OTHER): Payer: Medicaid Other | Admitting: Family Medicine

## 2017-04-20 DIAGNOSIS — L732 Hidradenitis suppurativa: Secondary | ICD-10-CM

## 2017-04-20 DIAGNOSIS — Z720 Tobacco use: Secondary | ICD-10-CM

## 2017-04-20 NOTE — Patient Instructions (Signed)

## 2017-04-20 NOTE — Assessment & Plan Note (Signed)
Patient still not interested in smoking cessation at this time. Counseled at length today and will continue to follow.

## 2017-04-20 NOTE — Assessment & Plan Note (Signed)
Improved, has decreased in size and no signs of infection. Counseled no need to trial antibiotics at this time. Reinforced good hygiene.

## 2017-04-20 NOTE — Progress Notes (Signed)
    Subjective:  Dorothy Haynes is a 31 y.o. female who presents to the [facility] today for follow up on weight  HPI:  Obesity - Has not made any of the changes as dicussed last visit, states because was "partying while on vacation" but now back at work and feels like structured schedule will help - Would like regular check in to monitor weight - Wants to meet with nutritionist - Plans to cut out soda, eat salads regularly - Has not yet but plans to call friend about fitness program and sign up for gym membership  Smoking - At 4 cigarettes a day, but only while gambling/partying - Still thinking about quitting but not quite ready - not interested in any medication aids  Pimple recheck - only took 1 pill of antibiotics - thinks is smaller and less painful - no fever/chills, no rashes   ROS: Per HPI  Objective:  Physical Exam: BP 132/80   Pulse 83   Temp 98.7 F (37.1 C) (Oral)   Wt 282 lb (127.9 kg)   LMP 04/06/2017   SpO2 99%   BMI 49.95 kg/m   Gen: NAD, resting comfortably CV: RRR with no murmurs appreciated Pulm: NWOB, CTAB with no crackles, wheezes, or rhonchi GI: Normal bowel sounds present. Soft, Nontender, Nondistended. MSK: no edema, cyanosis, or clubbing noted Skin: small palpable round nodule over lower sternum, 0.2 cm in diameter. No overlying redness or warmth. Nontender to palpation Neuro: grossly normal, moves all extremities Psych: Normal affect and thought content   Assessment/Plan:  Morbid obesity (HCC) Discussed healthy diet and exercise at length today. Patient wants to meet with nutritionist, Dr. Gerilyn Pilgrim contact info given and patient instructed to call. Follow up in 3 months to see how she is doing with lifestyle modifications  Hidradenitis Improved, has decreased in size and no signs of infection. Counseled no need to trial antibiotics at this time. Reinforced good hygiene.  Tobacco abuse Patient still not interested in smoking cessation at this  time. Counseled at length today and will continue to follow.   Leland Her, DO PGY-2, [facility] 04/20/2017 9:32 AM

## 2017-04-20 NOTE — Assessment & Plan Note (Signed)
Discussed healthy diet and exercise at length today. Patient wants to meet with nutritionist, Dr. Gerilyn Pilgrim contact info given and patient instructed to call. Follow up in 3 months to see how she is doing with lifestyle modifications

## 2017-10-02 ENCOUNTER — Encounter: Payer: Self-pay | Admitting: Family Medicine

## 2017-10-02 ENCOUNTER — Ambulatory Visit (INDEPENDENT_AMBULATORY_CARE_PROVIDER_SITE_OTHER): Payer: Self-pay | Admitting: Family Medicine

## 2017-10-02 ENCOUNTER — Other Ambulatory Visit: Payer: Self-pay

## 2017-10-02 ENCOUNTER — Ambulatory Visit: Payer: Self-pay | Admitting: Family Medicine

## 2017-10-02 DIAGNOSIS — J069 Acute upper respiratory infection, unspecified: Secondary | ICD-10-CM

## 2017-10-02 DIAGNOSIS — B9789 Other viral agents as the cause of diseases classified elsewhere: Secondary | ICD-10-CM

## 2017-10-02 NOTE — Patient Instructions (Addendum)
Thank you for coming to see me today. It was a pleasure!  Please follow-up with your regular dotor as needed.  If you have any questions or concerns, please do not hesitate to call the office at [phone].  Take Care,   [place] Aalyssa Elderkin, DO  Take the mucinex (guaifenesin).  Some other therapies you can try are: push fluids, rest and return office visit prn if symptoms persist or worsen.   Drinking warm liquids such as teas and soups can help with secretions and cough. A mist humidifier or vaporizer can work well to help with secretions and cough.  It is very important to clean the humidifier between use according to the instructions.    Throat spray for comfort of your throat.   NeilMed saline rinse. Please use distilled or filtered water and please be sure to clean it after each use. This will really help to keep your sinuses clear.   It was good to see you.  If you're still having trouble in the next week, come back and see us.    Of course, if you start having trouble breathing, worsening fevers, vomiting and unable to hold down any fluids, or you have other concerns, don't hesitate to come back or go to the ED after hours.

## 2017-10-02 NOTE — Assessment & Plan Note (Signed)
Patient instructed to do supportive care including mucinex, saline rinse, humidifier, drinking plenty of fluids and getting rest, and a chloraseptic as needed when throat hurts. Instructed to return if she develops facial tenderness and worsening of symptoms over the next week.

## 2017-10-02 NOTE — Progress Notes (Signed)
   Subjective:    Patient ID: Dorothy Haynes, female    DOB: 02/17/1986, 32 y.o.   MRN: [id number]   CC:  HPI: Glenford PeersUri symptoms for 1 months: - sore throat, ear hurting, cough is improving Headache this morning with sinus pressure - alka selzer helped - mom has been sick  [facility] [employer] - Patient has been getting sick and then getting better on and off this month  Review of Systems  Per HPI, else denies recent illness, fever, headache   Patient Active Problem List   Diagnosis Date Noted  . Viral URI with cough 10/02/2017  . Hidradenitis 03/22/2017  . Heartburn 10/23/2016  . Missed periods 10/23/2016  . Anxiety, mild 09/28/2016  . Prediabetes 08/19/2016  . Gallstones 10/09/2015  . Morbid obesity (HCC) 10/09/2015  . Tobacco abuse 10/09/2015     Objective:  BP 128/82   Pulse 93   Temp 98.5 F (36.9 C) (Oral)   Wt 283 lb (128.4 kg)   LMP 10/01/2017   SpO2 96%   BMI 50.13 kg/m  Vitals and nursing note reviewed  General: NAD, pleasant HEENT: BL TM clear and non-bulging, MMM. Normal oropharynx with mild erythema, no lesions or exudate Cardiac: RRR, normal heart sounds, no murmurs Respiratory: CTAB, normal effort Skin: warm and dry, no rashes noted Neuro: alert and oriented, no focal deficits   Assessment & Plan:    Viral URI with cough Patient instructed to do supportive care including mucinex, saline rinse, humidifier, drinking plenty of fluids and getting rest, and a chloraseptic as needed when throat hurts. Instructed to return if she develops facial tenderness and worsening of symptoms over the next week.     [place] Tery Hoeger, DO Family Medicine Resident PGY-1

## 2017-10-10 ENCOUNTER — Encounter (HOSPITAL_COMMUNITY): Payer: Self-pay | Admitting: Emergency Medicine

## 2017-10-10 ENCOUNTER — Ambulatory Visit (HOSPITAL_COMMUNITY)
Admission: EM | Admit: 2017-10-10 | Discharge: 2017-10-10 | Disposition: A | Discharge status: Home / Self Care | Payer: Medicaid Other | Attending: Family Medicine | Admitting: Family Medicine

## 2017-10-10 DIAGNOSIS — W57XXXA Bitten or stung by nonvenomous insect and other nonvenomous arthropods, initial encounter: Secondary | ICD-10-CM

## 2017-10-10 DIAGNOSIS — S40861A Insect bite (nonvenomous) of right upper arm, initial encounter: Secondary | ICD-10-CM

## 2017-10-10 MED ORDER — HYDROXYZINE HCL 25 MG PO TABS: 25.0000 mg | ORAL_TABLET | Freq: Four times a day (QID) | ORAL | 0 refills | Status: DC

## 2017-10-10 MED ORDER — MUPIROCIN 2 % EX OINT: 1.0000 "application " | TOPICAL_OINTMENT | Freq: Two times a day (BID) | CUTANEOUS | 0 refills | Status: DC

## 2017-10-10 NOTE — ED Triage Notes (Signed)
PT has three red, swollen, itchy areas over right arm. PT reports nothing new in home or routine, except her brother brought a new dog to her house last night, she then slept on the couch and woke up with these "bites."

## 2017-10-10 NOTE — ED Provider Notes (Signed)
[facility]    CSN: [id number] Arrival date & time: 10/10/17  1450     History   Chief Complaint Chief Complaint  Patient presents with  . Insect Bite    HPI Dorothy Haynes is a 32 y.o. female.   32 year old female comes in for 1 day history of insect bite. States family recently got a new dog, and was staying on the couch with her. Woke up with red swollen spots on her right arm that is pruritic in nature. Has been scratching it, has not done anything else for it. Denies fever, chills, night sweats. Denies pain. No new products. Denies trouble breathing, swelling of the throat.       Past Medical History:  Diagnosis Date  . GERD (gastroesophageal reflux disease) 2009  . Gestational diabetes 2008  . Headache   . History of gallstones 10/2015   s/p cholecystectomy 10/09/15  . Hypertension   . Morbid obesity (HCC) 10/09/2015  . Shortness of breath dyspnea   . Tobacco abuse 10/09/2015    Patient Active Problem List   Diagnosis Date Noted  . Viral URI with cough 10/02/2017  . Hidradenitis 03/22/2017  . Heartburn 10/23/2016  . Missed periods 10/23/2016  . Anxiety, mild 09/28/2016  . Prediabetes 08/19/2016  . Gallstones 10/09/2015  . Morbid obesity (HCC) 10/09/2015  . Tobacco abuse 10/09/2015    Past Surgical History:  Procedure Laterality Date  . CHOLECYSTECTOMY N/A 10/09/2015   Procedure: LAPAROSCOPIC CHOLECYSTECTOMY ;  Surgeon: Claud Kelp, MD;  Location: [facility];  Service: General;  Laterality: N/A;  . WISDOM TOOTH EXTRACTION      OB History    No data available       Home Medications    Prior to Admission medications   Medication Sig Start Date End Date Taking? Authorizing Provider  albuterol (PROVENTIL HFA;VENTOLIN HFA) 108 (90 Base) MCG/ACT inhaler Inhale 1-2 puffs into the lungs every 6 (six) hours as needed for wheezing or shortness of breath. 08/15/16   Law, Waylan Boga, PA-C  azithromycin (ZITHROMAX) 250 MG tablet Take 1 tablet (250 mg  total) by mouth daily. Take first 2 tablets together, then 1 every day until finished. 02/10/17   Barrett Henle, PA-C  benzonatate (TESSALON) 100 MG capsule Take 2 capsules (200 mg total) by mouth 2 (two) times daily as needed for cough. 02/10/17   Barrett Henle, PA-C  doxycycline (VIBRA-TABS) 100 MG tablet Take 1 tablet (100 mg total) by mouth 2 (two) times daily. 03/20/17   Leland Her, DO  hydrOXYzine (ATARAX/VISTARIL) 25 MG tablet Take 1 tablet (25 mg total) by mouth every 6 (six) hours. 10/10/17   Cathie Hoops, Robbi Scurlock V, PA-C  mupirocin ointment (BACTROBAN) 2 % Apply 1 application topically 2 (two) times daily. 10/10/17   Cathie Hoops, Yasira Engelson V, PA-C  naproxen (NAPROSYN) 500 MG tablet Take 1 tablet (500 mg total) by mouth 2 (two) times daily. 09/20/16   Barrett Henle, PA-C  oxymetazoline (AFRIN NASAL SPRAY) 0.05 % nasal spray Place 1 spray into both nostrils 2 (two) times daily. Spray once into each nostril twice daily for up to the next 3 days. Do not use for more than 3 days to prevent rebound rhinorrhea. 02/10/17   Barrett Henle, PA-C  ranitidine (ZANTAC) 150 MG tablet Take 1 tablet (150 mg total) by mouth 2 (two) times daily. 10/23/16   Leland Her, DO    Family History Family History  Problem Relation Age of Onset  .  Diabetes Mother   . Hypertension Mother   . Throat cancer Father   . Kidney Stones Father   . Alzheimer's disease Other   . Diabetes Other   . Hypertension Other   . Heart disease Other   . Heart disease Other   . Diabetes Other   . Hypertension Other   . Thyroid disease Other   . Diabetes Maternal Grandmother   . Diabetes Maternal Grandfather     Social History Social History   Tobacco Use  . Smoking status: Current Every Day Smoker    Packs/day: 0.20    Years: 17.00    Pack years: 3.40    Types: Cigarettes  . Smokeless tobacco: Never Used  Substance Use Topics  . Alcohol use: Yes    Comment: occ, 1-2 drink per week  . Drug use: No     Comment: previously used MJ, has been off recreational drugs     Allergies   Patient has no known allergies.   Review of Systems Review of Systems  Reason unable to perform ROS: See HPI as above.     Physical Exam Triage Vital Signs ED Triage Vitals  Enc Vitals Group     BP 10/10/17 1637 (!) 147/97     Pulse Rate 10/10/17 1634 92     Resp 10/10/17 1634 16     Temp 10/10/17 1634 98.3 F (36.8 C)     Temp Source 10/10/17 1634 Oral     SpO2 10/10/17 1634 97 %     Weight 10/10/17 1636 283 lb (128.4 kg)     Height --      Head Circumference --      Peak Flow --      Pain Score 10/10/17 1636 8     Pain Loc --      Pain Edu? --      Excl. in GC? --    No data found.  Updated Vital Signs BP (!) 147/97   Pulse 92   Temp 98.3 F (36.8 C) (Oral)   Resp 16   Wt 283 lb (128.4 kg)   LMP 10/01/2017   SpO2 97%   BMI 50.13 kg/m   Physical Exam  Constitutional: She is oriented to person, place, and time. She appears well-developed and well-nourished. No distress.  HENT:  Head: Normocephalic and atraumatic.  Mouth/Throat: Uvula is midline, oropharynx is clear and moist and mucous membranes are normal.  Eyes: Conjunctivae are normal. Pupils are equal, round, and reactive to light.  Cardiovascular: Normal rate, regular rhythm and normal heart sounds. Exam reveals no gallop and no friction rub.  No murmur heard. Pulmonary/Chest: Effort normal and breath sounds normal. No stridor. No respiratory distress. She has no wheezes. She has no rales.  Neurological: She is alert and oriented to person, place, and time.  Skin:  2 areas of erythema with swelling and increased warmth on the right arm. One about 5cm x 5cm, with small wound at the center. Other about 3cm x 3cm on the dorsal hand with small wound at the center. No fluctuance felt. No tenderness on palpation.      UC Treatments / Results  Labs (all labs ordered are listed, but only abnormal results are displayed) Labs  Reviewed - No data to display  EKG  EKG Interpretation None       Radiology No results found.  Procedures Procedures (including critical care time)  Medications Ordered in UC Medications - No data to display  Initial Impression / Assessment and Plan / UC Course  I have reviewed the triage vital signs and the nursing notes.  Pertinent labs & imaging results that were available during my care of the patient were reviewed by me and considered in my medical decision making (see chart for details).    No signs of cellulitis today.  Start hydroxyzine for pruritus.  Bactroban ointment on affected area to prevent infection.  Ice compress can also help with pruritus.  Return precautions given.  Patient expresses understanding and agrees to plan.  Final Clinical Impressions(s) / UC Diagnoses   Final diagnoses:  Insect bite, initial encounter    ED Discharge Orders        Ordered    hydrOXYzine (ATARAX/VISTARIL) 25 MG tablet  Every 6 hours     10/10/17 1655    mupirocin ointment (BACTROBAN) 2 %  2 times daily     10/10/17 1655        [address] [place] 10/10/17 1701

## 2017-10-10 NOTE — Discharge Instructions (Signed)
No infection today. Take hydroxyzine as directed for itchiness. Bactroban ointment on location to prevent infection. Ice compress can also help with itchiness. Monitor for spreading redness, increased warmth, fever, pain, follow up for reevaluation. If experiencing shortness of breath, trouble breathing, swelling of the throat, go to the emergency department for further evaluation.

## 2018-02-02 ENCOUNTER — Emergency Department (HOSPITAL_COMMUNITY)
Admission: EM | Admit: 2018-02-02 | Discharge: 2018-02-02 | Disposition: A | Discharge status: Home / Self Care | Payer: Medicaid Other | Attending: Emergency Medicine | Admitting: Emergency Medicine

## 2018-02-02 ENCOUNTER — Other Ambulatory Visit: Payer: Self-pay

## 2018-02-02 ENCOUNTER — Encounter (HOSPITAL_COMMUNITY): Payer: Self-pay | Admitting: Emergency Medicine

## 2018-02-02 DIAGNOSIS — R002 Palpitations: Secondary | ICD-10-CM | POA: Diagnosis present

## 2018-02-02 DIAGNOSIS — I10 Essential (primary) hypertension: Secondary | ICD-10-CM | POA: Insufficient documentation

## 2018-02-02 DIAGNOSIS — R112 Nausea with vomiting, unspecified: Secondary | ICD-10-CM | POA: Diagnosis not present

## 2018-02-02 DIAGNOSIS — F17210 Nicotine dependence, cigarettes, uncomplicated: Secondary | ICD-10-CM | POA: Diagnosis not present

## 2018-02-02 DIAGNOSIS — Z79899 Other long term (current) drug therapy: Secondary | ICD-10-CM | POA: Insufficient documentation

## 2018-02-02 LAB — CBC
HEMATOCRIT: 36.1 % (ref 36.0–46.0)
HEMOGLOBIN: 12.1 g/dL (ref 12.0–15.0)
MCH: 29.2 pg (ref 26.0–34.0)
MCHC: 33.5 g/dL (ref 30.0–36.0)
MCV: 87.2 fL (ref 78.0–100.0)
PLATELETS: 418 10*3/uL — AB (ref 150–400)
RBC: 4.14 MIL/uL (ref 3.87–5.11)
RDW: 13 % (ref 11.5–15.5)
WBC: 14 10*3/uL — ABNORMAL HIGH (ref 4.0–10.5)

## 2018-02-02 LAB — COMPREHENSIVE METABOLIC PANEL
ALBUMIN: 3.6 g/dL (ref 3.5–5.0)
ALT: 15 U/L (ref 14–54)
ANION GAP: 11 (ref 5–15)
AST: 14 U/L — ABNORMAL LOW (ref 15–41)
Alkaline Phosphatase: 66 U/L (ref 38–126)
BUN: 11 mg/dL (ref 6–20)
CO2: 25 mmol/L (ref 22–32)
Calcium: 8.7 mg/dL — ABNORMAL LOW (ref 8.9–10.3)
Chloride: 103 mmol/L (ref 101–111)
Creatinine, Ser: 0.59 mg/dL (ref 0.44–1.00)
GFR calc non Af Amer: >60 mL/min (ref >60)
GLUCOSE: 160 mg/dL — AB (ref 65–99)
Potassium: 3.7 mmol/L (ref 3.5–5.1)
SODIUM: 139 mmol/L (ref 135–145)
TOTAL PROTEIN: 7.2 g/dL (ref 6.5–8.1)
Total Bilirubin: 0.3 mg/dL (ref 0.3–1.2)

## 2018-02-02 LAB — LIPASE, BLOOD: LIPASE: 32 U/L (ref 11–51)

## 2018-02-02 LAB — I-STAT BETA HCG BLOOD, ED (MC, WL, AP ONLY): I-stat hCG, quantitative: <5 m[IU]/mL (ref <5)

## 2018-02-02 MED ORDER — ONDANSETRON 4 MG PO TBDP
4.0000 mg | ORAL_TABLET | Freq: Once | ORAL | Status: AC | PRN
  Administered 2018-02-02: 4 mg via ORAL
  Filled 2018-02-02: qty 1

## 2018-02-02 NOTE — ED Triage Notes (Signed)
Pt reports waking from sleep with vomiting. Pt reports symptoms started suddenly.

## 2018-02-02 NOTE — ED Provider Notes (Signed)
[facility]ENC[facility] Provider Note   CSN: [id number] Arrival date & time: 02/02/18  0520     History   Chief Complaint Chief Complaint  Patient presents with  . Emesis    HPI Dorothy Haynes is a 32 y.o. female.  HPI 32 year old female presents emergency department being awoken by acute palpitations this morning.  This lasted 10 or 15 minutes.  She developed nausea and vomiting.  No chest pain or shortness of breath.  She is never had symptoms like this before.  No new medications. No vomiting since.  No lightheadedness or syncope.  No recent exertional chest pain or shortness of breath.  No fevers or chills.  No abdominal pain.  No dysuria or urinary frequency.  No back or flank pain.  Symptoms are mild to moderate in severity.  Currently symptoms have completely resolved.    Past Medical History:  Diagnosis Date  . GERD (gastroesophageal reflux disease) 2009  . Gestational diabetes 2008  . Headache   . History of gallstones 10/2015   s/p cholecystectomy 10/09/15  . Hypertension   . Morbid obesity (HCC) 10/09/2015  . Shortness of breath dyspnea   . Tobacco abuse 10/09/2015    Patient Active Problem List   Diagnosis Date Noted  . Viral URI with cough 10/02/2017  . Hidradenitis 03/22/2017  . Heartburn 10/23/2016  . Missed periods 10/23/2016  . Anxiety, mild 09/28/2016  . Prediabetes 08/19/2016  . Gallstones 10/09/2015  . Morbid obesity (HCC) 10/09/2015  . Tobacco abuse 10/09/2015    Past Surgical History:  Procedure Laterality Date  . CHOLECYSTECTOMY N/A 10/09/2015   Procedure: LAPAROSCOPIC CHOLECYSTECTOMY ;  Surgeon: Claud KelpHaywood Ingram, MD;  Location: [facility];  Service: General;  Laterality: N/A;  . WISDOM TOOTH EXTRACTION       OB History   None      Home Medications    Prior to Admission medications   Medication Sig Start Date End Date Taking? Authorizing Provider  albuterol (PROVENTIL HFA;VENTOLIN HFA) 108 (90 Base) MCG/ACT inhaler  Inhale 1-2 puffs into the lungs every 6 (six) hours as needed for wheezing or shortness of breath. 08/15/16   Emi HolesLaw, Alexandra M, PA-C  hydrOXYzine (ATARAX/VISTARIL) 25 MG tablet Take 1 tablet (25 mg total) by mouth every 6 (six) hours. Patient not taking: Reported on 02/02/2018 10/10/17   Lurline IdolYu, Amy V, PA-C    Family History Family History  Problem Relation Age of Onset  . Diabetes Mother   . Hypertension Mother   . Throat cancer Father   . Kidney Stones Father   . Alzheimer's disease Other   . Diabetes Other   . Hypertension Other   . Heart disease Other   . Heart disease Other   . Diabetes Other   . Hypertension Other   . Thyroid disease Other   . Diabetes Maternal Grandmother   . Diabetes Maternal Grandfather     Social History Social History   Tobacco Use  . Smoking status: Current Every Day Smoker    Packs/day: 0.20    Years: 17.00    Pack years: 3.40    Types: Cigarettes  . Smokeless tobacco: Never Used  Substance Use Topics  . Alcohol use: Yes    Comment: occ, 1-2 drink per week  . Drug use: No    Comment: previously used MJ, has been off recreational drugs     Allergies   Patient has no known allergies.   Review of Systems Review of Systems  All other systems reviewed and are negative.    Physical Exam Updated Vital Signs BP (!) 144/95 (BP Location: Left Arm)   Pulse 76   Temp 98.6 F (37 C) (Oral)   Resp 17   Ht 5\' 3"  (1.6 m)   Wt 127 kg (280 lb)   LMP 02/01/2018   SpO2 100%   BMI 49.60 kg/m   Physical Exam  Constitutional: She is oriented to person, place, and time. She appears well-developed and well-nourished. No distress.  HENT:  Head: Normocephalic and atraumatic.  Eyes: EOM are normal.  Neck: Normal range of motion.  Cardiovascular: Normal rate, regular rhythm and normal heart sounds.  Pulmonary/Chest: Effort normal and breath sounds normal.  Abdominal: Soft. She exhibits no distension. There is no tenderness.  Musculoskeletal: Normal  range of motion.  Neurological: She is alert and oriented to person, place, and time.  Skin: Skin is warm and dry.  Psychiatric: She has a normal mood and affect. Judgment normal.  Nursing note and vitals reviewed.    ED Treatments / Results  Labs (all labs ordered are listed, but only abnormal results are displayed) Labs Reviewed  COMPREHENSIVE METABOLIC PANEL - Abnormal; Notable for the following components:      Result Value   Glucose, Bld 160 (*)    Calcium 8.7 (*)    AST 14 (*)    All other components within normal limits  CBC - Abnormal; Notable for the following components:   WBC 14.0 (*)    Platelets 418 (*)    All other components within normal limits  LIPASE, BLOOD  I-STAT BETA HCG BLOOD, ED ([facility], [facility], [facility] ONLY)    EKG ECG interpretation   Date: 02/02/2018  Rate: 76  Rhythm: normal sinus rhythm  QRS Axis: normal  Intervals: normal  ST/T Wave abnormalities: normal  Conduction Disutrbances: none  Narrative Interpretation:   Old EKG Reviewed: No significant changes noted     Radiology No results found.  Procedures Procedures (including critical care time)  Medications Ordered in ED Medications  ondansetron (ZOFRAN-ODT) disintegrating tablet 4 mg (4 mg Oral Given 02/02/18 0549)     Initial Impression / Assessment and Plan / ED Course  I have reviewed the triage vital signs and the nursing notes.  Pertinent labs & imaging results that were available during my care of the patient were reviewed by me and considered in my medical decision making (see chart for details).     Acute palpitations.  EKG sinus rhythm.  No longer symptomatic.  Discharged home in good condition.  Electrolytes without abnormality.  Vital signs stable.  Overall well-appearing.  No associated syncope.  Primary care follow-up.  Patient understands return to the emergency department for new or worsening symptoms  Final Clinical Impressions(s) / ED Diagnoses   Final diagnoses:  None      ED Discharge Orders    None       Azalia Bilis, MD 02/02/18 [phone]

## 2018-02-04 ENCOUNTER — Ambulatory Visit: Payer: Medicaid Other | Admitting: Family Medicine

## 2018-02-08 ENCOUNTER — Other Ambulatory Visit: Payer: Self-pay

## 2018-02-08 ENCOUNTER — Ambulatory Visit (INDEPENDENT_AMBULATORY_CARE_PROVIDER_SITE_OTHER): Payer: Self-pay | Admitting: Internal Medicine

## 2018-02-08 ENCOUNTER — Encounter: Payer: Self-pay | Admitting: Internal Medicine

## 2018-02-08 DIAGNOSIS — E1159 Type 2 diabetes mellitus with other circulatory complications: Secondary | ICD-10-CM | POA: Insufficient documentation

## 2018-02-08 DIAGNOSIS — I152 Hypertension secondary to endocrine disorders: Secondary | ICD-10-CM | POA: Insufficient documentation

## 2018-02-08 DIAGNOSIS — I10 Essential (primary) hypertension: Secondary | ICD-10-CM

## 2018-02-08 MED ORDER — HYDROCHLOROTHIAZIDE 12.5 MG PO TABS: 12.5000 mg | ORAL_TABLET | Freq: Every day | ORAL | 0 refills | Status: DC

## 2018-02-08 NOTE — Patient Instructions (Signed)
I am going to start on HCTZ at 12.5 mg. Please follow up in 1 month

## 2018-02-08 NOTE — Progress Notes (Signed)
   [facility] [name], MD Phone: [phone]  Reason For Visit: Elevated Blood Pressure  #  CHRONIC HTN: Previous history of elevated blood pressure.  Was seen in the ED and told she had elevated blood pressure has never been treated at this clinic but is been treated at other clinics Patient does smoke daily and has gained weight over the last couple of years.  She denies having a significant amount of salt in her diet Denies CP, dyspnea, HA, edema, dizziness / lightheadedness Family hx of blood pressure - mother, aunt,   Past Medical History Reviewed problem list.  Medications- reviewed and updated No additions to family history Social history- patient is a smoker  Objective: BP (!) 148/100   Pulse 88   Temp 98.3 F (36.8 C) (Oral)   Wt 276 lb 9.6 oz (125.5 kg)   LMP 02/01/2018   SpO2 98%   BMI 49.00 kg/m  Gen: NAD, alert, cooperative with exam Cardio: regular rate and rhythm, S1S2 heard, no murmurs appreciated Pulm: clear to auscultation bilaterally, no wheezes, rhonchi or rales Skin: dry, intact, no rashes or lesions    Assessment/Plan: See problem based a/p  Essential hypertension Recent blood work obtained in the ED, normal Start hydrochlorothiazide Follow-up in 2 weeks

## 2018-02-08 NOTE — Assessment & Plan Note (Addendum)
Recent blood work obtained in the ED, normal Start hydrochlorothiazide Follow-up in 2 weeks

## 2018-03-07 ENCOUNTER — Other Ambulatory Visit: Payer: Self-pay | Admitting: Internal Medicine

## 2018-03-07 DIAGNOSIS — I10 Essential (primary) hypertension: Secondary | ICD-10-CM

## 2018-03-15 ENCOUNTER — Encounter (HOSPITAL_COMMUNITY): Payer: Self-pay | Admitting: Emergency Medicine

## 2018-03-15 ENCOUNTER — Inpatient Hospital Stay (HOSPITAL_COMMUNITY)
Admission: EM | Admit: 2018-03-15 | Discharge: 2018-03-20 | DRG: 392 | Disposition: A | Discharge status: Home / Self Care | Payer: Medicaid Other | Attending: Family Medicine | Admitting: Family Medicine

## 2018-03-15 ENCOUNTER — Emergency Department (HOSPITAL_COMMUNITY): Payer: Medicaid Other

## 2018-03-15 DIAGNOSIS — E662 Morbid (severe) obesity with alveolar hypoventilation: Secondary | ICD-10-CM | POA: Diagnosis present

## 2018-03-15 DIAGNOSIS — K5720 Diverticulitis of large intestine with perforation and abscess without bleeding: Secondary | ICD-10-CM | POA: Diagnosis not present

## 2018-03-15 DIAGNOSIS — F17210 Nicotine dependence, cigarettes, uncomplicated: Secondary | ICD-10-CM | POA: Diagnosis present

## 2018-03-15 DIAGNOSIS — D649 Anemia, unspecified: Secondary | ICD-10-CM | POA: Diagnosis present

## 2018-03-15 DIAGNOSIS — K5792 Diverticulitis of intestine, part unspecified, without perforation or abscess without bleeding: Secondary | ICD-10-CM | POA: Diagnosis present

## 2018-03-15 DIAGNOSIS — Z6841 Body Mass Index (BMI) 40.0 and over, adult: Secondary | ICD-10-CM

## 2018-03-15 DIAGNOSIS — Z79899 Other long term (current) drug therapy: Secondary | ICD-10-CM

## 2018-03-15 DIAGNOSIS — K5780 Diverticulitis of intestine, part unspecified, with perforation and abscess without bleeding: Secondary | ICD-10-CM | POA: Diagnosis not present

## 2018-03-15 DIAGNOSIS — I152 Hypertension secondary to endocrine disorders: Secondary | ICD-10-CM | POA: Diagnosis present

## 2018-03-15 DIAGNOSIS — K5732 Diverticulitis of large intestine without perforation or abscess without bleeding: Secondary | ICD-10-CM

## 2018-03-15 DIAGNOSIS — Z833 Family history of diabetes mellitus: Secondary | ICD-10-CM | POA: Diagnosis not present

## 2018-03-15 DIAGNOSIS — E1159 Type 2 diabetes mellitus with other circulatory complications: Secondary | ICD-10-CM | POA: Diagnosis present

## 2018-03-15 DIAGNOSIS — I10 Essential (primary) hypertension: Secondary | ICD-10-CM | POA: Diagnosis present

## 2018-03-15 HISTORY — DX: Diverticulitis of intestine, part unspecified, without perforation or abscess without bleeding: K57.92

## 2018-03-15 LAB — COMPREHENSIVE METABOLIC PANEL
ALBUMIN: 3.7 g/dL (ref 3.5–5.0)
ALT: 18 U/L (ref 0–44)
ANION GAP: 11 (ref 5–15)
AST: 15 U/L (ref 15–41)
Alkaline Phosphatase: 72 U/L (ref 38–126)
BILIRUBIN TOTAL: 0.6 mg/dL (ref 0.3–1.2)
BUN: 10 mg/dL (ref 6–20)
CHLORIDE: 100 mmol/L (ref 98–111)
CO2: 27 mmol/L (ref 22–32)
Calcium: 9 mg/dL (ref 8.9–10.3)
Creatinine, Ser: 0.61 mg/dL (ref 0.44–1.00)
GFR calc Af Amer: >60 mL/min (ref >60)
GFR calc non Af Amer: >60 mL/min (ref >60)
GLUCOSE: 158 mg/dL — AB (ref 70–99)
POTASSIUM: 3.8 mmol/L (ref 3.5–5.1)
SODIUM: 138 mmol/L (ref 135–145)
TOTAL PROTEIN: 7.7 g/dL (ref 6.5–8.1)

## 2018-03-15 LAB — URINALYSIS, ROUTINE W REFLEX MICROSCOPIC
BILIRUBIN URINE: NEGATIVE
GLUCOSE, UA: NEGATIVE mg/dL
Hgb urine dipstick: NEGATIVE
Ketones, ur: NEGATIVE mg/dL
Leukocytes, UA: NEGATIVE
NITRITE: NEGATIVE
PH: 5 (ref 5.0–8.0)
Protein, ur: NEGATIVE mg/dL
SPECIFIC GRAVITY, URINE: 1.02 (ref 1.005–1.030)

## 2018-03-15 LAB — CBC
HEMATOCRIT: 36.4 % (ref 36.0–46.0)
HEMOGLOBIN: 12 g/dL (ref 12.0–15.0)
MCH: 28.7 pg (ref 26.0–34.0)
MCHC: 33 g/dL (ref 30.0–36.0)
MCV: 87.1 fL (ref 78.0–100.0)
Platelets: 518 10*3/uL — ABNORMAL HIGH (ref 150–400)
RBC: 4.18 MIL/uL (ref 3.87–5.11)
RDW: 13.2 % (ref 11.5–15.5)
WBC: 15.8 10*3/uL — ABNORMAL HIGH (ref 4.0–10.5)

## 2018-03-15 LAB — WET PREP, GENITAL
CLUE CELLS WET PREP: NONE SEEN
SPERM: NONE SEEN
Trich, Wet Prep: NONE SEEN
YEAST WET PREP: NONE SEEN

## 2018-03-15 LAB — LIPASE, BLOOD: LIPASE: 39 U/L (ref 11–51)

## 2018-03-15 LAB — I-STAT BETA HCG BLOOD, ED (MC, WL, AP ONLY): I-stat hCG, quantitative: <5 m[IU]/mL (ref <5)

## 2018-03-15 MED ORDER — ONDANSETRON HCL 4 MG PO TABS
4.0000 mg | ORAL_TABLET | Freq: Four times a day (QID) | ORAL | Status: DC | PRN
Start: 2018-03-15 — End: 2018-03-20

## 2018-03-15 MED ORDER — ACETAMINOPHEN 325 MG PO TABS
650.0000 mg | ORAL_TABLET | Freq: Four times a day (QID) | ORAL | Status: DC | PRN
  Administered 2018-03-19: 650 mg via ORAL
  Filled 2018-03-15: qty 2

## 2018-03-15 MED ORDER — MORPHINE SULFATE (PF) 2 MG/ML IV SOLN
2.0000 mg | INTRAVENOUS | Status: DC | PRN
  Administered 2018-03-15 – 2018-03-16 (×7): 2 mg via INTRAVENOUS
  Filled 2018-03-15 (×7): qty 1

## 2018-03-15 MED ORDER — IOPAMIDOL (ISOVUE-300) INJECTION 61%
INTRAVENOUS | Status: AC
  Filled 2018-03-15: qty 100

## 2018-03-15 MED ORDER — IOPAMIDOL (ISOVUE-300) INJECTION 61%
100.0000 mL | Freq: Once | INTRAVENOUS | Status: AC | PRN
  Administered 2018-03-15: 100 mL via INTRAVENOUS

## 2018-03-15 MED ORDER — MORPHINE SULFATE (PF) 4 MG/ML IV SOLN
4.0000 mg | Freq: Once | INTRAVENOUS | Status: AC
  Administered 2018-03-15: 4 mg via INTRAVENOUS
  Filled 2018-03-15: qty 1

## 2018-03-15 MED ORDER — PIPERACILLIN-TAZOBACTAM 3.375 G IVPB
3.3750 g | Freq: Three times a day (TID) | INTRAVENOUS | Status: DC
  Administered 2018-03-16 – 2018-03-20 (×13): 3.375 g via INTRAVENOUS
  Filled 2018-03-15 (×13): qty 50

## 2018-03-15 MED ORDER — ONDANSETRON HCL 4 MG/2ML IJ SOLN
4.0000 mg | Freq: Once | INTRAMUSCULAR | Status: AC
  Administered 2018-03-15: 4 mg via INTRAVENOUS
  Filled 2018-03-15: qty 2

## 2018-03-15 MED ORDER — ONDANSETRON HCL 4 MG/2ML IJ SOLN: 4.0000 mg | Freq: Four times a day (QID) | INTRAMUSCULAR | Status: DC | PRN

## 2018-03-15 MED ORDER — SODIUM CHLORIDE 0.9 % IV BOLUS
1000.0000 mL | Freq: Once | INTRAVENOUS | Status: AC
Start: 2018-03-15 — End: 2018-03-15
  Administered 2018-03-15: 1000 mL via INTRAVENOUS

## 2018-03-15 MED ORDER — SODIUM CHLORIDE 0.9 % IV BOLUS
1000.0000 mL | Freq: Once | INTRAVENOUS | Status: AC
  Administered 2018-03-15: 1000 mL via INTRAVENOUS

## 2018-03-15 MED ORDER — HYDRALAZINE HCL 20 MG/ML IJ SOLN: 5.0000 mg | INTRAMUSCULAR | Status: DC | PRN

## 2018-03-15 MED ORDER — PIPERACILLIN-TAZOBACTAM 3.375 G IVPB 30 MIN
3.3750 g | Freq: Once | INTRAVENOUS | Status: AC
  Administered 2018-03-15: 3.375 g via INTRAVENOUS
  Filled 2018-03-15: qty 50

## 2018-03-15 MED ORDER — DEXTROSE-NACL 5-0.9 % IV SOLN
INTRAVENOUS | Status: DC
  Administered 2018-03-15: 1000 mL via INTRAVENOUS

## 2018-03-15 MED ORDER — ACETAMINOPHEN 650 MG RE SUPP: 650.0000 mg | Freq: Four times a day (QID) | RECTAL | Status: DC | PRN

## 2018-03-15 NOTE — ED Provider Notes (Signed)
[facility]-EMERGENCY DEPT Provider Note   CSN: [id number] Arrival date & time: 03/15/18  1225     History   Chief Complaint Chief Complaint  Patient presents with  . Abdominal Pain    HPI Dorothy Haynes is a 32 y.o. female with past medical history of obesity, acid reflux, hypertension, obesity, tobacco abuse who presents emergency department today for lower abdominal pain.  Patient reports that she woke this morning with diffuse lower abdominal pain/pelvic pain.  She reports it is a cramping sensation as though she is giving birth.  Reports the pain is constant and worsened with movement.  She rates her current pain as a 9/10.  She notes she does not take anything for symptoms.  She denies history of similar.  She denies any fever, chills, upper abdominal pain, nausea/vomiting/diarrhea, constipation, melena, hematochezia, urinary frequency, urinary urgency, dysuria, hematuria, flank pain. Reports normal bowel movement today. She notes she is sexually active with one female partner and does not always use protection. She is unsure if she is pregnant.  She denies any vaginal bleeding or discharge.  She has prior cholecystectomy. No prior colonoscopy.   HPI  Past Medical History:  Diagnosis Date  . GERD (gastroesophageal reflux disease) 2009  . Gestational diabetes 2008  . Headache   . History of gallstones 10/2015   s/p cholecystectomy 10/09/15  . Hypertension   . Morbid obesity (HCC) 10/09/2015  . Shortness of breath dyspnea   . Tobacco abuse 10/09/2015    Patient Active Problem List   Diagnosis Date Noted  . Essential hypertension 02/08/2018  . Viral URI with cough 10/02/2017  . Hidradenitis 03/22/2017  . Heartburn 10/23/2016  . Missed periods 10/23/2016  . Anxiety, mild 09/28/2016  . Prediabetes 08/19/2016  . Gallstones 10/09/2015  . Morbid obesity (HCC) 10/09/2015  . Tobacco abuse 10/09/2015    Past Surgical History:  Procedure Laterality Date  .  CHOLECYSTECTOMY N/A 10/09/2015   Procedure: LAPAROSCOPIC CHOLECYSTECTOMY ;  Surgeon: Claud Kelp, MD;  Location: [facility];  Service: General;  Laterality: N/A;  . WISDOM TOOTH EXTRACTION       OB History   None      Home Medications    Prior to Admission medications   Medication Sig Start Date End Date Taking? Authorizing Provider  hydrochlorothiazide (HYDRODIURIL) 12.5 MG tablet TAKE 1 TABLET BY MOUTH EVERY DAY 03/08/18  Yes Jeneen Rinks J, DO  albuterol (PROVENTIL HFA;VENTOLIN HFA) 108 (90 Base) MCG/ACT inhaler Inhale 1-2 puffs into the lungs every 6 (six) hours as needed for wheezing or shortness of breath. 08/15/16   Emi Holes, PA-C  hydrOXYzine (ATARAX/VISTARIL) 25 MG tablet Take 1 tablet (25 mg total) by mouth every 6 (six) hours. Patient not taking: Reported on 02/02/2018 10/10/17   Lurline Idol    Family History Family History  Problem Relation Age of Onset  . Diabetes Mother   . Hypertension Mother   . Throat cancer Father   . Kidney Stones Father   . Alzheimer's disease Other   . Diabetes Other   . Hypertension Other   . Heart disease Other   . Heart disease Other   . Diabetes Other   . Hypertension Other   . Thyroid disease Other   . Diabetes Maternal Grandmother   . Diabetes Maternal Grandfather     Social History Social History   Tobacco Use  . Smoking status: Current Every Day Smoker    Packs/day: 0.20  Years: 17.00    Pack years: 3.40    Types: Cigarettes  . Smokeless tobacco: Never Used  Substance Use Topics  . Alcohol use: Yes    Comment: occ, 1-2 drink per week  . Drug use: No    Comment: previously used MJ, has been off recreational drugs     Allergies   Patient has no known allergies.   Review of Systems Review of Systems  All other systems reviewed and are negative.    Physical Exam Updated Vital Signs BP (!) 138/102 (BP Location: Left Arm)   Pulse 97   Temp 98.3 F (36.8 C) (Oral)   Resp 20   LMP 03/01/2018  (Approximate)   SpO2 98%   Physical Exam  Constitutional: She appears well-developed and well-nourished.  Eating [employer]  HENT:  Head: Normocephalic and atraumatic.  Right Ear: External ear normal.  Left Ear: External ear normal.  Nose: Nose normal.  Mouth/Throat: Uvula is midline, oropharynx is clear and moist and mucous membranes are normal. No tonsillar exudate.  Eyes: Pupils are equal, round, and reactive to light. Right eye exhibits no discharge. Left eye exhibits no discharge. No scleral icterus.  Neck: Trachea normal. Neck supple. No spinous process tenderness present. No neck rigidity. Normal range of motion present.  Cardiovascular: Normal rate, regular rhythm and intact distal pulses.  No murmur heard. Pulses:      Radial pulses are 2+ on the right side, and 2+ on the left side.       Dorsalis pedis pulses are 2+ on the right side, and 2+ on the left side.       Posterior tibial pulses are 2+ on the right side, and 2+ on the left side.  No lower extremity swelling or edema. Calves symmetric in size bilaterally.  Pulmonary/Chest: Effort normal and breath sounds normal. She exhibits no tenderness.  Abdominal: Soft. Bowel sounds are normal. She exhibits no distension. There is tenderness in the right lower quadrant, suprapubic area and left lower quadrant. There is guarding. There is no rigidity, no rebound and no CVA tenderness.  Genitourinary:  Genitourinary Comments: Exam performed by Jacinto Halim, exam chaperoned Pelvic exam: normal external genitalia without evidence of trauma. VULVA: normal appearing vulva with no masses, tenderness or lesion. VAGINA: normal appearing vagina with normal color and discharge, no lesions. CERVIX: normal appearing cervix without lesions, cervical motion tenderness absent, cervical os closed with out purulent discharge; vaginal discharge - white and curd-like, Wet prep and DNA probe for chlamydia and GC obtained.   ADNEXA: normal adnexa in  size, nontender and no masses UTERUS: uterus is normal size, shape, consistency and nontender.   Musculoskeletal: She exhibits no edema.  Lymphadenopathy:    She has no cervical adenopathy.  Neurological: She is alert.  Skin: Skin is warm and dry. No rash noted. She is not diaphoretic.  Psychiatric: She has a normal mood and affect.  Nursing note and vitals reviewed.    ED Treatments / Results  Labs (all labs ordered are listed, but only abnormal results are displayed) Labs Reviewed  WET PREP, GENITAL - Abnormal; Notable for the following components:      Result Value   WBC, Wet Prep HPF POC MANY (*)    All other components within normal limits  COMPREHENSIVE METABOLIC PANEL - Abnormal; Notable for the following components:   Glucose, Bld 158 (*)    All other components within normal limits  CBC - Abnormal; Notable for the following components:  WBC 15.8 (*)    Platelets 518 (*)    All other components within normal limits  URINALYSIS, ROUTINE W REFLEX MICROSCOPIC - Abnormal; Notable for the following components:   APPearance HAZY (*)    All other components within normal limits  LIPASE, BLOOD  RPR  HIV ANTIBODY (ROUTINE TESTING)  COMPREHENSIVE METABOLIC PANEL  CBC WITH DIFFERENTIAL/PLATELET  I-STAT BETA HCG BLOOD, ED ([facility], [facility], [facility] ONLY)  GC/CHLAMYDIA PROBE AMP ([place]) NOT AT [facility]    EKG None  Radiology Ct Abdomen Pelvis W Contrast  Result Date: 03/15/2018 CLINICAL DATA:  Lower abdominal cramping. EXAM: CT ABDOMEN AND PELVIS WITH CONTRAST TECHNIQUE: Multidetector CT imaging of the abdomen and pelvis was performed using the standard protocol following bolus administration of intravenous contrast. CONTRAST:  100mL ISOVUE-300 IOPAMIDOL (ISOVUE-300) INJECTION 61% COMPARISON:  08/15/2016 FINDINGS: Lower chest: Normal size heart without pericardial effusion. Clear lung bases with minimal dependent atelectasis. No pulmonary consolidation, mass, effusion or pneumothorax.  Hepatobiliary: Homogeneous enhancement of the liver without space-occupying mass. No biliary dilatation. Status post cholecystectomy. Pancreas: Normal Spleen: Normal Adrenals/Urinary Tract: Adrenal glands are unremarkable. Kidneys are normal, without renal calculi, focal lesion, or hydronephrosis. Bladder is unremarkable. Stomach/Bowel: Pericolonic inflammation along the mid sigmoid colon consistent with acute diverticulitis complicated by tiny microperforations characterized by small foci of adjacent contain mesenteric gas. No pericolonic abscess. No bowel obstruction. The remainder of the gastrointestinal system is unremarkable. Normal appearing appendix is visualized. Vascular/Lymphatic: No significant vascular findings are present. No enlarged abdominal or pelvic lymph nodes. Reproductive: The uterus is unremarkable. Bilateral physiologic sized follicles are seen within the ovaries. Other: No free fluid. Musculoskeletal: No acute or significant osseous findings. IMPRESSION: Acute mid sigmoid diverticulitis complicated by tiny contained microperforations with extraluminal gas seen. No abscess or bowel obstruction. These results were called by telephone at the time of interpretation on 03/15/2018 at 7:32 pm to [facility] Anton Cheramie , who verbally acknowledged these results. Electronically Signed   By: Tollie Ethavid  Kwon M.D.   On: 03/15/2018 19:32    Procedures Procedures (including critical care time) CRITICAL CARE Performed by: Jacinto HalimMichael M Amado Andal   Total critical care time: 45 minutes - diverticulitis with microperforation.   Critical care time was exclusive of separately billable procedures and treating other patients.  Critical care was necessary to treat or prevent imminent or life-threatening deterioration.  Critical care was time spent personally by me on the following activities: development of treatment plan with patient and/or surrogate as well as nursing, discussions with consultants, evaluation of  patient's response to treatment, examination of patient, obtaining history from patient or surrogate, ordering and performing treatments and interventions, ordering and review of laboratory studies, ordering and review of radiographic studies, pulse oximetry and re-evaluation of patient's condition.   Medications Ordered in ED Medications  iopamidol (ISOVUE-300) 61 % injection (has no administration in time range)  acetaminophen (TYLENOL) tablet 650 mg (has no administration in time range)    Or  acetaminophen (TYLENOL) suppository 650 mg (has no administration in time range)  ondansetron (ZOFRAN) tablet 4 mg (has no administration in time range)    Or  ondansetron (ZOFRAN) injection 4 mg (has no administration in time range)  dextrose 5 %-0.9 % sodium chloride infusion (has no administration in time range)  hydrALAZINE (APRESOLINE) injection 5 mg (has no administration in time range)  piperacillin-tazobactam (ZOSYN) IVPB 3.375 g (has no administration in time range)  sodium chloride 0.9 % bolus 1,000 mL (0 mLs Intravenous Stopped 03/15/18 2137)  morphine 4 MG/ML injection 4 mg (4 mg Intravenous Given 03/15/18 1835)  iopamidol (ISOVUE-300) 61 % injection 100 mL (100 mLs Intravenous Contrast Given 03/15/18 1907)  morphine 4 MG/ML injection 4 mg (4 mg Intravenous Given 03/15/18 2036)  sodium chloride 0.9 % bolus 1,000 mL (1,000 mLs Intravenous New Bag/Given 03/15/18 2031)  piperacillin-tazobactam (ZOSYN) IVPB 3.375 g (0 g Intravenous Stopped 03/15/18 2101)  ondansetron (ZOFRAN) injection 4 mg (4 mg Intravenous Given 03/15/18 2034)     Initial Impression / Assessment and Plan / ED Course  I have reviewed the triage vital signs and the nursing notes.  Pertinent labs & imaging results that were available during my care of the patient were reviewed by me and considered in my medical decision making (see chart for details).     32 year old female with acute mid sigmoid diverticulitis completed by a  tiny contained microperforation with extraluminal gas seen.  There is no abscess or bowel obstruction.  Patient with mild tachycardia in the department.  Vital signs are otherwise reassuring.  She is without fever, tachypnea, hypoxia or hypotension.  Patient does have a leukocytosis of 15.8.  There is no anemia.  Kidney function within normal limits.  Pregnancy test is negative.  Lipase within normal limits.  No LFT derangements.  No evidence of DKA.  Wet prep unremarkable.  Pelvic exam without evidence of PID or adnexal tenderness.  UA without evidence UTI.  Patient has been n.p.o. since approximately 5:00 as she was eating [employer] when entered the room.  Patient has received IV fluids, morphine, Zofran as well as started on Zosyn per antibiotic protocol.  Dr. Ezzard Standing states that general surgery will follow.  Recommends admission to hospitalist service for IV fluids, n.p.o. and IV antibiotics.  I appreciate Dr. Toniann Fail for admitting the patient to the hospitalist service.   Final Clinical Impressions(s) / ED Diagnoses   Final diagnoses:  Diverticulitis of sigmoid colon  Diverticulitis of large intestine with perforation without bleeding    ED Discharge Orders    None       Jacinto Halim, Cordelia Poche 03/15/18 2248    Tegeler, Canary Brim, MD 03/16/18 [phone]

## 2018-03-15 NOTE — ED Triage Notes (Signed)
Patient c/o lower abdominal cramping since this morning. Denies N/V/D and urinary sx.

## 2018-03-15 NOTE — Progress Notes (Signed)
Pharmacy Antibiotic Note  Megan MansKetrina L Brauner is a 32 y.o. female admitted on 03/15/2018 with IAI.  Pharmacy has been consulted for Zosyn dosing.  Plan: Zosyn 3.375g IV q8h (4 hour infusion).  Will sign off    Temp (24hrs), Avg:98.3 F (36.8 C), Min:98.3 F (36.8 C), Max:98.3 F (36.8 C)  Recent Labs  Lab 03/15/18 1250  WBC 15.8*  CREATININE 0.61    CrCl cannot be calculated (Unknown ideal weight.).    No Known Allergies   Thank you for allowing pharmacy to be a part of this patient's care.  Berkley HarveyLegge, Kyser Wandel Marshall 03/15/2018 9:19 PM

## 2018-03-15 NOTE — ED Notes (Signed)
Attempted an IV x 2 but was unsuccessful.

## 2018-03-15 NOTE — ED Notes (Signed)
ED TO INPATIENT HANDOFF REPORT  Name/Age/Gender Dorothy Haynes 32 y.o. female  Code Status    Code Status Orders  (From admission, onward)        Start     Ordered   03/15/18 2116  Full code  Continuous     03/15/18 2117    Code Status History    This patient has a current code status but no historical code status.      Home/SNF/Other Home  Chief Complaint abd pain  Level of Care/Admitting Diagnosis ED Disposition    ED Disposition Condition Comment   Admit  Hospital Area: [facility] [[id number]]  Level of Care: Med-Surg [16]  Diagnosis: Acute diverticulitis of intestine [[id number]]  Admitting Physician: Rise Patience [phone]  Attending Physician: Rise Patience [phone]  Estimated length of stay: past midnight tomorrow  Certification:: I certify this patient will need inpatient services for at least 2 midnights  PT Class (Do Not Modify): Inpatient [101]  PT Acc Code (Do Not Modify): Private [1]       Medical History Past Medical History:  Diagnosis Date  . GERD (gastroesophageal reflux disease) 2009  . Gestational diabetes 2008  . Headache   . History of gallstones 10/2015   s/p cholecystectomy 10/09/15  . Hypertension   . Morbid obesity ([place]) 10/09/2015  . Shortness of breath dyspnea   . Tobacco abuse 10/09/2015    Allergies No Known Allergies  IV Location/Drains/Wounds Patient Lines/Drains/Airways Status   Active Line/Drains/Airways    Name:   Placement date:   Placement time:   Site:   Days:   Incision (Closed) 10/09/15 Abdomen   10/09/15    1239     888   Incision - 4 Ports Abdomen Right;Lateral;Upper Right;Medial;Upper Umbilicus Mid;Upper   79/89/21    1145     888          Labs/Imaging Results for orders placed or performed during the hospital encounter of 03/15/18 (from the past 48 hour(s))  Lipase, blood     Status: None   Collection Time: 03/15/18 12:50 PM  Result Value Ref Range   Lipase 39 11 - 51 U/L     Comment: Performed at [facility], [place] [address] [place], [place] [address]  Comprehensive metabolic panel     Status: Abnormal   Collection Time: 03/15/18 12:50 PM  Result Value Ref Range   Sodium 138 135 - 145 mmol/L   Potassium 3.8 3.5 - 5.1 mmol/L   Chloride 100 98 - 111 mmol/L    Comment: Please note change in reference range.   CO2 27 22 - 32 mmol/L   Glucose, Bld 158 (H) 70 - 99 mg/dL    Comment: Please note change in reference range.   BUN 10 6 - 20 mg/dL    Comment: Please note change in reference range.   Creatinine, Ser 0.61 0.44 - 1.00 mg/dL   Calcium 9.0 8.9 - 10.3 mg/dL   Total Protein 7.7 6.5 - 8.1 g/dL   Albumin 3.7 3.5 - 5.0 g/dL   AST 15 15 - 41 U/L   ALT 18 0 - 44 U/L    Comment: Please note change in reference range.   Alkaline Phosphatase 72 38 - 126 U/L   Total Bilirubin 0.6 0.3 - 1.2 mg/dL   GFR calc non Af Amer >60 >60 mL/min   GFR calc Af Amer >60 >60 mL/min    Comment: (NOTE) The eGFR has been  calculated using the CKD EPI equation. This calculation has not been validated in all clinical situations. eGFR's persistently <60 mL/min signify possible Chronic Kidney Disease.    Anion gap 11 5 - 15    Comment: Performed at [facility], [place] [address] [place], [place] [address]  CBC     Status: Abnormal   Collection Time: 03/15/18 12:50 PM  Result Value Ref Range   WBC 15.8 (H) 4.0 - 10.5 K/uL   RBC 4.18 3.87 - 5.11 MIL/uL   Hemoglobin 12.0 12.0 - 15.0 g/dL   HCT 36.4 36.0 - 46.0 %   MCV 87.1 78.0 - 100.0 fL   MCH 28.7 26.0 - 34.0 pg   MCHC 33.0 30.0 - 36.0 g/dL   RDW 13.2 11.5 - 15.5 %   Platelets 518 (H) 150 - 400 K/uL    Comment: Performed at [facility], [place] [address] [place], [place] [address]  Urinalysis, Routine w reflex microscopic     Status: Abnormal   Collection Time: 03/15/18 12:50 PM  Result Value Ref Range   Color, Urine YELLOW YELLOW   APPearance HAZY (A) CLEAR    Specific Gravity, Urine 1.020 1.005 - 1.030   pH 5.0 5.0 - 8.0   Glucose, UA NEGATIVE NEGATIVE mg/dL   Hgb urine dipstick NEGATIVE NEGATIVE   Bilirubin Urine NEGATIVE NEGATIVE   Ketones, ur NEGATIVE NEGATIVE mg/dL   Protein, ur NEGATIVE NEGATIVE mg/dL   Nitrite NEGATIVE NEGATIVE   Leukocytes, UA NEGATIVE NEGATIVE    Comment: Performed at [place] [address] [place], [place] [address]  I-Stat beta hCG blood, ED     Status: None   Collection Time: 03/15/18 12:54 PM  Result Value Ref Range   I-stat hCG, quantitative <5.0 <5 mIU/mL   Comment 3            Comment:   GEST. AGE      CONC.  (mIU/mL)   <=1 WEEK        5 - 50     2 WEEKS       50 - 500     3 WEEKS       100 - 10,000     4 WEEKS     1,000 - 30,000        FEMALE AND NON-PREGNANT FEMALE:     LESS THAN 5 mIU/mL   Wet prep, genital     Status: Abnormal   Collection Time: 03/15/18  6:44 PM  Result Value Ref Range   Yeast Wet Prep HPF POC NONE SEEN NONE SEEN   Trich, Wet Prep NONE SEEN NONE SEEN   Clue Cells Wet Prep HPF POC NONE SEEN NONE SEEN   WBC, Wet Prep HPF POC MANY (A) NONE SEEN   Sperm NONE SEEN     Comment: Performed at [facility], [place] [address], [place], [place] [address]   Ct Abdomen Pelvis W Contrast  Result Date: 03/15/2018 CLINICAL DATA:  Lower abdominal cramping. EXAM: CT ABDOMEN AND PELVIS WITH CONTRAST TECHNIQUE: Multidetector CT imaging of the abdomen and pelvis was performed using the standard protocol following bolus administration of intravenous contrast. CONTRAST:  147m ISOVUE-300 IOPAMIDOL (ISOVUE-300) INJECTION 61% COMPARISON:  08/15/2016 FINDINGS: Lower chest: Normal size heart without pericardial effusion. Clear lung bases with minimal dependent atelectasis. No pulmonary consolidation, mass, effusion or pneumothorax. Hepatobiliary: Homogeneous enhancement of the liver without space-occupying mass. No biliary dilatation. Status post cholecystectomy.  Pancreas: Normal  Spleen: Normal Adrenals/Urinary Tract: Adrenal glands are unremarkable. Kidneys are normal, without renal calculi, focal lesion, or hydronephrosis. Bladder is unremarkable. Stomach/Bowel: Pericolonic inflammation along the mid sigmoid colon consistent with acute diverticulitis complicated by tiny microperforations characterized by small foci of adjacent contain mesenteric gas. No pericolonic abscess. No bowel obstruction. The remainder of the gastrointestinal system is unremarkable. Normal appearing appendix is visualized. Vascular/Lymphatic: No significant vascular findings are present. No enlarged abdominal or pelvic lymph nodes. Reproductive: The uterus is unremarkable. Bilateral physiologic sized follicles are seen within the ovaries. Other: No free fluid. Musculoskeletal: No acute or significant osseous findings. IMPRESSION: Acute mid sigmoid diverticulitis complicated by tiny contained microperforations with extraluminal gas seen. No abscess or bowel obstruction. These results were called by telephone at the time of interpretation on 03/15/2018 at 7:32 pm to [place] , who verbally acknowledged these results. Electronically Signed   By: Ashley Royalty M.D.   On: 03/15/2018 19:32    Pending Labs Unresulted Labs (From admission, onward)   Start     Ordered   03/16/18 0500  Comprehensive metabolic panel  Tomorrow morning,   R     03/15/18 2117   03/16/18 0500  CBC WITH DIFFERENTIAL  Tomorrow morning,   R     03/15/18 2117   03/15/18 1709  RPR  (STI Panel)  Once,   STAT     03/15/18 1708   03/15/18 1709  HIV antibody  (STI Panel)  Once,   STAT     03/15/18 1708      Vitals/Pain Today's Vitals   03/15/18 1509 03/15/18 1842 03/15/18 2030 03/15/18 2030  BP: (!) 138/102 (!) 152/89 122/81   Pulse: 97 (!) 108 91   Resp: _0 Temp:      TempSrc:      SpO2: 98% 100% 100%   PainSc:    9     Isolation Precautions No active isolations  Medications Medications   iopamidol (ISOVUE-300) 61 % injection (has no administration in time range)  acetaminophen (TYLENOL) tablet 650 mg (has no administration in time range)    Or  acetaminophen (TYLENOL) suppository 650 mg (has no administration in time range)  ondansetron (ZOFRAN) tablet 4 mg (has no administration in time range)    Or  ondansetron (ZOFRAN) injection 4 mg (has no administration in time range)  dextrose 5 %-0.9 % sodium chloride infusion (has no administration in time range)  hydrALAZINE (APRESOLINE) injection 5 mg (has no administration in time range)  piperacillin-tazobactam (ZOSYN) IVPB 3.375 g (has no administration in time range)  sodium chloride 0.9 % bolus 1,000 mL (1,000 mLs Intravenous New Bag/Given 03/15/18 1833)  morphine 4 MG/ML injection 4 mg (4 mg Intravenous Given 03/15/18 1835)  iopamidol (ISOVUE-300) 61 % injection 100 mL (100 mLs Intravenous Contrast Given 03/15/18 1907)  morphine 4 MG/ML injection 4 mg (4 mg Intravenous Given 03/15/18 2036)  sodium chloride 0.9 % bolus 1,000 mL (1,000 mLs Intravenous New Bag/Given 03/15/18 2031)  piperacillin-tazobactam (ZOSYN) IVPB 3.375 g (0 g Intravenous Stopped 03/15/18 2101)  ondansetron (ZOFRAN) injection 4 mg (4 mg Intravenous Given 03/15/18 2034)    Mobility walks

## 2018-03-15 NOTE — H&P (Signed)
Re:   Dorothy MansKetrina L Sopher DOB:   07/11/1986 MRN:   [id number]  Chief Complaint Abdominal pain  ASSESEMENT AND PLAN: 1.  Diverticulitis with microperf  Plan:  Admission, IV antibiotics, NPO, and IVF  Will re-evaluate in the AM  2.  Morbid obesity  BMI - 49  Discussed weight loss 3.  Smokes  Discussed quitting smoking 4.  HTN  Chief Complaint  Patient presents with  . Abdominal Pain   PHYSICIAN REQUESTING CONSULTATION: Leary RocaMichael Maczis, PA, Janyce LlanosWLER  HISTORY OF PRESENT ILLNESS: Dorothy Haynes is a 32 y.o. (DOB: 12/15/1985)  AA female whose primary care physician is Leland HerYoo, Elsia J, DO. Her mother, Hulen Shoutsvis Hoskins, friend, Meridee ScoreSyliva Hatfield, and son, Cira Servantman Nash,  are in the room   She has somewhat of an attitude.  She developed lower abdominal/pelvic pain this AM. She has never had pain like this before.  She was in the ER about 1-2 months ago for "panic attack".   She drove herself to the ER, it hurt when she hit bumps.   Her only prior surgery was cholecystectomy - 2017 [facility].  She has no family history of diverticular disease  She is eating [employer] in the ER (but it makes her stomach hurt).  Has GERD, but no history of stomach disease or liver disease.   No history of pancreas disease.    Abdominal CT scan - 03/15/2018 - Acute mid sigmoid diverticulitis complicated by tiny contained microperforations with extraluminal gas seen. No abscess or bowel obstruction.  WBC - 03/15/2018 - 15,600   Past Medical History:  Diagnosis Date  . GERD (gastroesophageal reflux disease) 2009  . Gestational diabetes 2008  . Headache   . History of gallstones 10/2015   s/p cholecystectomy 10/09/15  . Hypertension   . Morbid obesity (HCC) 10/09/2015  . Shortness of breath dyspnea   . Tobacco abuse 10/09/2015      Past Surgical History:  Procedure Laterality Date  . CHOLECYSTECTOMY N/A 10/09/2015   Procedure: LAPAROSCOPIC CHOLECYSTECTOMY ;  Surgeon: Claud KelpHaywood Ingram, MD;  Location: [facility];  Service: General;   Laterality: N/A;  . WISDOM TOOTH EXTRACTION        Current Facility-Administered Medications  Medication Dose Route Frequency Provider Last Rate Last Dose  . iopamidol (ISOVUE-300) 61 % injection           . morphine 4 MG/ML injection 4 mg  4 mg Intravenous Once Jacinto HalimMaczis, Michael M, PA-C      . ondansetron [facility](ZOFRAN) injection 4 mg  4 mg Intravenous Once Leary RocaMaczis, Michael M, PA-C      . piperacillin-tazobactam (ZOSYN) IVPB 3.375 g  3.375 g Intravenous Once Leary RocaMaczis, Michael M, PA-C      . sodium chloride 0.9 % bolus 1,000 mL  1,000 mL Intravenous Once Jacinto HalimMaczis, Michael M, PA-C       Current Outpatient Medications  Medication Sig Dispense Refill  . hydrochlorothiazide (HYDRODIURIL) 12.5 MG tablet TAKE 1 TABLET BY MOUTH EVERY DAY 30 tablet 0  . albuterol (PROVENTIL HFA;VENTOLIN HFA) 108 (90 Base) MCG/ACT inhaler Inhale 1-2 puffs into the lungs every 6 (six) hours as needed for wheezing or shortness of breath. 1 Inhaler 0  . hydrOXYzine (ATARAX/VISTARIL) 25 MG tablet Take 1 tablet (25 mg total) by mouth every 6 (six) hours. (Patient not taking: Reported on 02/02/2018) 12 tablet 0     No Known Allergies  REVIEW OF SYSTEMS: Skin:  No history of rash.  No history of abnormal moles. Infection:  No  history of hepatitis or HIV.  No history of MRSA. Neurologic:  No history of stroke.  No history of seizure.  No history of headaches. Cardiac:  HTN No history of heart disease. No history of seeing a cardiologist. Pulmonary:  Smokes.  Endocrine:  No diabetes. No thyroid disease. Gastrointestinal:  See HPI. Urologic:  No history of kidney stones.  No history of bladder infections. Musculoskeletal:  No history of joint or back disease. Hematologic:  No bleeding disorder.  No history of anemia.  Not anticoagulated. Psycho-social:  The patient is oriented.   The patient has no obvious psychologic or social impairment to understanding our conversation and plan.  SOCIAL and FAMILY HISTORY: Her mother, Hulen Shouts, friend, Meridee Score, and son, Cira Servant,  are in the room. She lives with her mother. She is not married. She has the one son. 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  No family history of diverticular disease.  PHYSICAL EXAM: BP (!) 152/89   Pulse (!) 108   Temp 98.3 F (36.8 C) (Oral)   Resp 20   LMP 03/01/2018 (Approximate)   SpO2 100%   General: Obese AA F who is alert and generally healthy appearing.  Skin:  Inspection and palpation - no mass or rash. Eyes:  Conjunctiva and lids unremarkable.            Pupils are equal Ears, Nose, Mouth, and Throat:  Ears and nose unremarkable            Lips and teeth are unremarable. Neck: Supple. No mass, trachea midline.  No thyroid mass. Lymph Nodes:  No supraclavicular, cervical, or inguinal nodes. Lungs: Normal respiratory effort.  Clear to auscultation and symmetric breath sounds. Heart:  Palpation of the heart is normal.            Auscultation: RRR. No murmur or rub.  Abdomen: Soft. No mass. No tenderness. No hernia.             Has bowel sounds.    Tenderness in her lower abdomen, but no peritoneal sxes. Rectal: Not done. Musculoskeletal:  Good muscle strength and ROM  in upper and lower extremities.  Neurologic:  Grossly intact to motor and sensory function. Psychiatric: Normal judgement and insight. Behavior is normal.            Oriented to time, person, place.   DATA REVIEWED, COUNSELING AND COORDINATION OF CARE: Epic notes reviewed. Counseling and coordination of care exceeded more than 50% of the time spent with patient. Total time spent with patient and charting: 45 minutes  Ovidio Kin, MD,  [facility], [place] [address] [place]  [address]   [place], [place]    [address] Phone:  [phone] FAX:  [phone]

## 2018-03-15 NOTE — H&P (Signed)
History and Physical    NATHAN STALLWORTH WUJ:[id number] DOB: 26-Feb-1986 DOA: 03/15/2018  PCP: Leland Her, DO  Patient coming from: Home.  Chief Complaint: Abdominal pain.  HPI: Dorothy Haynes is a 32 y.o. female with history of recently diagnosed hypertension placed on hydrochlorothiazide presents to the ER with complaints of left lower quadrant abdominal pain since this morning.  Abdominal pain is mostly crampy in nature.  Denies any associated nausea vomiting or diarrhea or any fever or chills.  Pain at times was severe.  ED Course: In the ER CT abdomen and pelvis done showed sigmoid diverticulitis with microperforation.  On-call general surgeon has been consulted patient started on empiric antibiotics fluids pain medications and admitted for further observation.  Review of Systems: As per HPI, rest all negative.   Past Medical History:  Diagnosis Date  . GERD (gastroesophageal reflux disease) 2009  . Gestational diabetes 2008  . Headache   . History of gallstones 10/2015   s/p cholecystectomy 10/09/15  . Hypertension   . Morbid obesity (HCC) 10/09/2015  . Shortness of breath dyspnea   . Tobacco abuse 10/09/2015    Past Surgical History:  Procedure Laterality Date  . CHOLECYSTECTOMY N/A 10/09/2015   Procedure: LAPAROSCOPIC CHOLECYSTECTOMY ;  Surgeon: Claud Kelp, MD;  Location: [facility];  Service: General;  Laterality: N/A;  . WISDOM TOOTH EXTRACTION       reports that she has been smoking cigarettes.  She has a 3.40 pack-year smoking history. She has never used smokeless tobacco. She reports that she drinks alcohol. She reports that she does not use drugs.  No Known Allergies  Family History  Problem Relation Age of Onset  . Diabetes Mother   . Hypertension Mother   . Throat cancer Father   . Kidney Stones Father   . Alzheimer's disease Other   . Diabetes Other   . Hypertension Other   . Heart disease Other   . Heart disease Other   . Diabetes Other   .  Hypertension Other   . Thyroid disease Other   . Diabetes Maternal Grandmother   . Diabetes Maternal Grandfather     Prior to Admission medications   Medication Sig Start Date End Date Taking? Authorizing Provider  hydrochlorothiazide (HYDRODIURIL) 12.5 MG tablet TAKE 1 TABLET BY MOUTH EVERY DAY 03/08/18  Yes Jeneen Rinks J, DO  albuterol (PROVENTIL HFA;VENTOLIN HFA) 108 (90 Base) MCG/ACT inhaler Inhale 1-2 puffs into the lungs every 6 (six) hours as needed for wheezing or shortness of breath. 08/15/16   Emi Holes, PA-C  hydrOXYzine (ATARAX/VISTARIL) 25 MG tablet Take 1 tablet (25 mg total) by mouth every 6 (six) hours. Patient not taking: Reported on 02/02/2018 10/10/17   Belinda Fisher, PA-C    Physical Exam: Vitals:   03/15/18 1233 03/15/18 1509 03/15/18 1842 03/15/18 2030  BP: (!) 136/97 (!) 138/102 (!) 152/89 122/81  Pulse: (!) 101 97 (!) 108 91  Resp: 20 20 20 20   Temp: 98.3 F (36.8 C)     TempSrc: Oral     SpO2: 96% 98% 100% 100%      Constitutional: Moderately built and nourished. Vitals:   03/15/18 1233 03/15/18 1509 03/15/18 1842 03/15/18 2030  BP: (!) 136/97 (!) 138/102 (!) 152/89 122/81  Pulse: (!) 101 97 (!) 108 91  Resp: 20 20 20 20   Temp: 98.3 F (36.8 C)     TempSrc: Oral     SpO2: 96% 98% 100% 100%  Eyes: Anicteric no pallor. ENMT: No discharge from the ears eyes nose or mouth. Neck: No mass palpated no neck rigidity but no JVD appreciated. Respiratory: No rhonchi or crepitations. Cardiovascular: S1-S2 heard no murmurs appreciated. Abdomen: Soft mild tenderness in left lower quadrant no guarding or rigidity. Musculoskeletal: No edema. Skin: No rash. Neurologic: Alert awake oriented to time place and person.  Moves all extremities. Psychiatric: Appears normal per normal affect.   Labs on Admission: I have personally reviewed following labs and imaging studies  CBC: Recent Labs  Lab 03/15/18 1250  WBC 15.8*  HGB 12.0  HCT 36.4  MCV 87.1  PLT  518*   Basic Metabolic Panel: Recent Labs  Lab 03/15/18 1250  NA 138  K 3.8  CL 100  CO2 27  GLUCOSE 158*  BUN 10  CREATININE 0.61  CALCIUM 9.0   GFR: CrCl cannot be calculated (Unknown ideal weight.). Liver Function Tests: Recent Labs  Lab 03/15/18 1250  AST 15  ALT 18  ALKPHOS 72  BILITOT 0.6  PROT 7.7  ALBUMIN 3.7   Recent Labs  Lab 03/15/18 1250  LIPASE 39   No results for input(s): AMMONIA in the last 168 hours. Coagulation Profile: No results for input(s): INR, PROTIME in the last 168 hours. Cardiac Enzymes: No results for input(s): CKTOTAL, CKMB, CKMBINDEX, TROPONINI in the last 168 hours. BNP (last 3 results) No results for input(s): PROBNP in the last 8760 hours. HbA1C: No results for input(s): HGBA1C in the last 72 hours. CBG: No results for input(s): GLUCAP in the last 168 hours. Lipid Profile: No results for input(s): CHOL, HDL, LDLCALC, TRIG, CHOLHDL, LDLDIRECT in the last 72 hours. Thyroid Function Tests: No results for input(s): TSH, T4TOTAL, FREET4, T3FREE, THYROIDAB in the last 72 hours. Anemia Panel: No results for input(s): VITAMINB12, FOLATE, FERRITIN, TIBC, IRON, RETICCTPCT in the last 72 hours. Urine analysis:    Component Value Date/Time   COLORURINE YELLOW 03/15/2018 1250   APPEARANCEUR HAZY (A) 03/15/2018 1250   LABSPEC 1.020 03/15/2018 1250   PHURINE 5.0 03/15/2018 1250   GLUCOSEU NEGATIVE 03/15/2018 1250   HGBUR NEGATIVE 03/15/2018 1250   BILIRUBINUR NEGATIVE 03/15/2018 1250   KETONESUR NEGATIVE 03/15/2018 1250   PROTEINUR NEGATIVE 03/15/2018 1250   UROBILINOGEN 0.2 02/20/2015 1630   NITRITE NEGATIVE 03/15/2018 1250   LEUKOCYTESUR NEGATIVE 03/15/2018 1250   Sepsis Labs: @LABRCNTIP (procalcitonin:4,lacticidven:4) ) Recent Results (from the past 240 hour(s))  Wet prep, genital     Status: Abnormal   Collection Time: 03/15/18  6:44 PM  Result Value Ref Range Status   Yeast Wet Prep HPF POC NONE SEEN NONE SEEN Final    Trich, Wet Prep NONE SEEN NONE SEEN Final   Clue Cells Wet Prep HPF POC NONE SEEN NONE SEEN Final   WBC, Wet Prep HPF POC MANY (A) NONE SEEN Final   Sperm NONE SEEN  Final    Comment: Performed at [facility], [address], [place], [place] [address]     Radiological Exams on Admission: Ct Abdomen Pelvis W Contrast  Result Date: 03/15/2018 CLINICAL DATA:  Lower abdominal cramping. EXAM: CT ABDOMEN AND PELVIS WITH CONTRAST TECHNIQUE: Multidetector CT imaging of the abdomen and pelvis was performed using the standard protocol following bolus administration of intravenous contrast. CONTRAST:  ISOVUE-300 IOPAMIDOL (ISOVUE-300) INJECTION 61% COMPARISON:  08/15/2016 FINDINGS: Lower chest: Normal size heart without pericardial effusion. Clear lung bases with minimal dependent atelectasis. No pulmonary consolidation, mass, effusion or pneumothorax. Hepatobiliary: Homogeneous enhancement of  the liver without space-occupying mass. No biliary dilatation. Status post cholecystectomy. Pancreas: Normal Spleen: Normal Adrenals/Urinary Tract: Adrenal glands are unremarkable. Kidneys are normal, without renal calculi, focal lesion, or hydronephrosis. Bladder is unremarkable. Stomach/Bowel: Pericolonic inflammation along the mid sigmoid colon consistent with acute diverticulitis complicated by tiny microperforations characterized by small foci of adjacent contain mesenteric gas. No pericolonic abscess. No bowel obstruction. The remainder of the gastrointestinal system is unremarkable. Normal appearing appendix is visualized. Vascular/Lymphatic: No significant vascular findings are present. No enlarged abdominal or pelvic lymph nodes. Reproductive: The uterus is unremarkable. Bilateral physiologic sized follicles are seen within the ovaries. Other: No free fluid. Musculoskeletal: No acute or significant osseous findings. IMPRESSION: Acute mid sigmoid diverticulitis complicated by tiny contained  microperforations with extraluminal gas seen. No abscess or bowel obstruction. These results were called by telephone at the time of interpretation on 03/15/2018 at 7:32 pm to PA [facility] MAC[facility]IS , who verbally acknowledged these results. Electronically Signed   By: Tollie Ethavid  Kwon M.D.   On: 03/15/2018 19:32     Assessment/Plan Principal Problem:   Acute diverticulitis of intestine Active Problems:   Morbid obesity (HCC)   Essential hypertension    1. Acute sigmoid diverticulitis with microperforation -appreciate general surgery consult patient will be kept n.p.o. on IV fluids antibiotics and pain relief medications. 2. History of hypertension we will hold hydrochlorothiazide since patient is n.p.o. and receiving IV fluids.  Will keep patient on PRN IV hydralazine.   DVT prophylaxis: SCDs. Code Status: Full code. Family Communication: Discussed with patient. Disposition Plan: Home. Consults called: General surgery. Admission status: Inpatient.   Eduard ClosArshad N Erle Guster MD [facility] Pager [phone].  If 7PM-7AM, please contact night-coverage [email or web address] Password [email or web address]  03/15/2018, 9:18 PM

## 2018-03-15 NOTE — ED Notes (Signed)
Went to collect urine sample from patient and she is eating a bojangles meal. Patient was informed that urine is needed to as it is part of her care. Patient said that's why I'm drinking now because I was in the lobby for a while.

## 2018-03-15 NOTE — ED Notes (Signed)
GI at bedside

## 2018-03-15 NOTE — ED Notes (Signed)
Notified patient for transport.

## 2018-03-16 ENCOUNTER — Other Ambulatory Visit: Payer: Self-pay

## 2018-03-16 LAB — GLUCOSE, CAPILLARY
GLUCOSE-CAPILLARY: 130 mg/dL — AB (ref 70–99)
GLUCOSE-CAPILLARY: 85 mg/dL (ref 70–99)
GLUCOSE-CAPILLARY: 135 mg/dL — AB (ref 70–99)
Glucose-Capillary: 147 mg/dL — ABNORMAL HIGH (ref 70–99)
Glucose-Capillary: 153 mg/dL — ABNORMAL HIGH (ref 70–99)
Glucose-Capillary: 46 mg/dL — ABNORMAL LOW (ref 70–99)

## 2018-03-16 LAB — COMPREHENSIVE METABOLIC PANEL
ALBUMIN: 3.1 g/dL — AB (ref 3.5–5.0)
ALK PHOS: 60 U/L (ref 38–126)
ALT: 20 U/L (ref 0–44)
AST: 21 U/L (ref 15–41)
Anion gap: 8 (ref 5–15)
BILIRUBIN TOTAL: 0.4 mg/dL (ref 0.3–1.2)
BUN: 7 mg/dL (ref 6–20)
CALCIUM: 8.2 mg/dL — AB (ref 8.9–10.3)
CO2: 28 mmol/L (ref 22–32)
Chloride: 102 mmol/L (ref 98–111)
Creatinine, Ser: 0.58 mg/dL (ref 0.44–1.00)
GFR calc Af Amer: >60 mL/min (ref >60)
GFR calc non Af Amer: >60 mL/min (ref >60)
GLUCOSE: 173 mg/dL — AB (ref 70–99)
Potassium: 3.7 mmol/L (ref 3.5–5.1)
Sodium: 138 mmol/L (ref 135–145)
TOTAL PROTEIN: 6.6 g/dL (ref 6.5–8.1)

## 2018-03-16 LAB — CBC WITH DIFFERENTIAL/PLATELET
BASOS ABS: 0 10*3/uL (ref 0.0–0.1)
BASOS PCT: 0 %
EOS ABS: 0.2 10*3/uL (ref 0.0–0.7)
EOS PCT: 1 %
HEMATOCRIT: 31.9 % — AB (ref 36.0–46.0)
Hemoglobin: 10.5 g/dL — ABNORMAL LOW (ref 12.0–15.0)
Lymphocytes Relative: 22 %
Lymphs Abs: 3.2 10*3/uL (ref 0.7–4.0)
MCH: 28.8 pg (ref 26.0–34.0)
MCHC: 32.9 g/dL (ref 30.0–36.0)
MCV: 87.6 fL (ref 78.0–100.0)
MONO ABS: 0.5 10*3/uL (ref 0.1–1.0)
Monocytes Relative: 4 %
NEUTROS ABS: 10.7 10*3/uL — AB (ref 1.7–7.7)
Neutrophils Relative %: 73 %
Platelets: 424 10*3/uL — ABNORMAL HIGH (ref 150–400)
RBC: 3.64 MIL/uL — ABNORMAL LOW (ref 3.87–5.11)
RDW: 13.4 % (ref 11.5–15.5)
WBC: 14.6 10*3/uL — ABNORMAL HIGH (ref 4.0–10.5)

## 2018-03-16 LAB — GC/CHLAMYDIA PROBE AMP (~~LOC~~) NOT AT ARMC
Chlamydia: NEGATIVE
NEISSERIA GONORRHEA: NEGATIVE

## 2018-03-16 LAB — RPR: RPR Ser Ql: NONREACTIVE

## 2018-03-16 LAB — HIV ANTIBODY (ROUTINE TESTING W REFLEX): HIV Screen 4th Generation wRfx: NONREACTIVE

## 2018-03-16 MED ORDER — SODIUM CHLORIDE 0.9 % IV SOLN
INTRAVENOUS | Status: DC
  Administered 2018-03-16: 15:00:00 via INTRAVENOUS

## 2018-03-16 MED ORDER — DEXTROSE 50 % IV SOLN
INTRAVENOUS | Status: AC
  Administered 2018-03-16: 50 mL
  Filled 2018-03-16: qty 50

## 2018-03-16 MED ORDER — DEXTROSE-NACL 5-0.9 % IV SOLN
INTRAVENOUS | Status: DC
  Administered 2018-03-16 – 2018-03-19 (×5): via INTRAVENOUS

## 2018-03-16 MED ORDER — MORPHINE SULFATE (PF) 2 MG/ML IV SOLN: 2.0000 mg | Freq: Once | INTRAVENOUS | Status: DC

## 2018-03-16 MED ORDER — ENOXAPARIN SODIUM 40 MG/0.4ML ~~LOC~~ SOLN
40.0000 mg | Freq: Every day | SUBCUTANEOUS | Status: DC
  Administered 2018-03-16: 40 mg via SUBCUTANEOUS
  Filled 2018-03-16: qty 0.4

## 2018-03-16 MED ORDER — ENOXAPARIN SODIUM 60 MG/0.6ML ~~LOC~~ SOLN
60.0000 mg | Freq: Every day | SUBCUTANEOUS | Status: DC
  Administered 2018-03-17 – 2018-03-20 (×4): 60 mg via SUBCUTANEOUS
  Filled 2018-03-16 (×5): qty 0.6

## 2018-03-16 MED ORDER — DIPHENHYDRAMINE HCL 50 MG/ML IJ SOLN
12.5000 mg | Freq: Four times a day (QID) | INTRAMUSCULAR | Status: DC | PRN
  Administered 2018-03-16: 12.5 mg via INTRAVENOUS
  Filled 2018-03-16: qty 1

## 2018-03-16 NOTE — Progress Notes (Signed)
Spoke with patient at bedside regarding concerns with medications. She states her BP med was $11 and she said that was too much. Discussed with her to shop around for meds to compare cost, showed her GoodRx app that shows script should only cost $4. She says she can afford that, was appreciative of assistance. Has PCP appt scheduled. No further HH needs. [phone]

## 2018-03-16 NOTE — Progress Notes (Signed)
[facility] Progress Note     Subjective: CC-  Feeling slightly better than yesterday when she was admitted yesterday, but does continues to have some LLQ discomfort. Denies n/v. States that she wants something to eat or drink. WBC 14.6, afebrile  Objective: Vital signs in last 24 hours: Temp:  [98.3 F (36.8 C)-98.9 F (37.2 C)] 98.7 F (37.1 C) (07/16 0606) Pulse Rate:  [89-108] 92 (07/16 0606) Resp:  [17-20] 17 (07/16 0606) BP: (118-152)/(61-102) 118/61 (07/16 0606) SpO2:  [95 %-100 %] 95 % (07/16 0606) Last BM Date: 03/15/18  Intake/Output from previous day: 07/15 0701 - 07/16 0700 In: 678.3 [I.V.:678.3] Out: -  Intake/Output this shift: No intake/output data recorded.  PE: Gen:  Alert, NAD HEENT: EOM's intact, pupils equal and round Card:  RRR, no M/G/R heard Pulm:  CTAB, no W/R/R, effort normal Abd: obese, soft, mild LLQ and suprapubic TTP without rebound or guarding, +BS, no HSM, no hernia Psych: A&Ox3  Skin: no rashes noted, warm and dry  Lab Results:  Recent Labs    03/15/18 1250 03/16/18 0515  WBC 15.8* 14.6*  HGB 12.0 10.5*  HCT 36.4 31.9*  PLT 518* 424*   BMET Recent Labs    03/15/18 1250 03/16/18 0515  NA 138 138  K 3.8 3.7  CL 100 102  CO2 27 28  GLUCOSE 158* 173*  BUN 10 7  CREATININE 0.61 0.58  CALCIUM 9.0 8.2*   PT/INR No results for input(s): LABPROT, INR in the last 72 hours. CMP     Component Value Date/Time   NA 138 03/16/2018 0515   K 3.7 03/16/2018 0515   CL 102 03/16/2018 0515   CO2 28 03/16/2018 0515   GLUCOSE 173 (H) 03/16/2018 0515   BUN 7 03/16/2018 0515   CREATININE 0.58 03/16/2018 0515   CREATININE 0.64 09/30/2016 1123   CALCIUM 8.2 (L) 03/16/2018 0515   PROT 6.6 03/16/2018 0515   ALBUMIN 3.1 (L) 03/16/2018 0515   AST 21 03/16/2018 0515   ALT 20 03/16/2018 0515   ALKPHOS 60 03/16/2018 0515   BILITOT 0.4 03/16/2018 0515   GFRNONAA >60 03/16/2018 0515   GFRNONAA >89 09/30/2016 1123   GFRAA  >60 03/16/2018 0515   GFRAA >89 09/30/2016 1123   Lipase     Component Value Date/Time   LIPASE 39 03/15/2018 1250       Studies/Results: Ct Abdomen Pelvis W Contrast  Result Date: 03/15/2018 CLINICAL DATA:  Lower abdominal cramping. EXAM: CT ABDOMEN AND PELVIS WITH CONTRAST TECHNIQUE: Multidetector CT imaging of the abdomen and pelvis was performed using the standard protocol following bolus administration of intravenous contrast. CONTRAST:  100mL ISOVUE-300 IOPAMIDOL (ISOVUE-300) INJECTION 61% COMPARISON:  08/15/2016 FINDINGS: Lower chest: Normal size heart without pericardial effusion. Clear lung bases with minimal dependent atelectasis. No pulmonary consolidation, mass, effusion or pneumothorax. Hepatobiliary: Homogeneous enhancement of the liver without space-occupying mass. No biliary dilatation. Status post cholecystectomy. Pancreas: Normal Spleen: Normal Adrenals/Urinary Tract: Adrenal glands are unremarkable. Kidneys are normal, without renal calculi, focal lesion, or hydronephrosis. Bladder is unremarkable. Stomach/Bowel: Pericolonic inflammation along the mid sigmoid colon consistent with acute diverticulitis complicated by tiny microperforations characterized by small foci of adjacent contain mesenteric gas. No pericolonic abscess. No bowel obstruction. The remainder of the gastrointestinal system is unremarkable. Normal appearing appendix is visualized. Vascular/Lymphatic: No significant vascular findings are present. No enlarged abdominal or pelvic lymph nodes. Reproductive: The uterus is unremarkable. Bilateral physiologic sized follicles are seen within the ovaries. Other: No free  fluid. Musculoskeletal: No acute or significant osseous findings. IMPRESSION: Acute mid sigmoid diverticulitis complicated by tiny contained microperforations with extraluminal gas seen. No abscess or bowel obstruction. These results were called by telephone at the time of interpretation on 03/15/2018 at 7:32  pm to PA [facility] , who verbally acknowledged these results. Electronically Signed   By: Tollie Eth M.D.   On: 03/15/2018 19:32    Anti-infectives: Anti-infectives (From admission, onward)   Start     Dose/Rate Route Frequency Ordered Stop   03/16/18 0600  piperacillin-tazobactam (ZOSYN) IVPB 3.375 g     3.375 g 12.5 mL/hr over 240 Minutes Intravenous Every 8 hours 03/15/18 2121     03/15/18 1945  piperacillin-tazobactam (ZOSYN) IVPB 3.375 g     3.375 g 100 mL/hr over 30 Minutes Intravenous  Once 03/15/18 1941 03/15/18 2101       Assessment/Plan HTN Morbid obesity Tobacco abuse  Diverticulitis with microperforation - first bout of diverticulitis, never had a colonoscopy before - CT 7/15 showed acute mid sigmoid diverticulitis complicated by tiny contained microperforations with extraluminal gas seen. No abscess or bowel obstruction - WBC 14.6 from 15.8, afebrile - Continue bowel rest and and IV abx. Encouraged patient to ambulate today. Repeat labs in AM. If WBC normalizes and pain improves may start clear liquids tomorrow.  ID - zosyn 7/15>> FEN - IVF, NPO VTE - SCDs, ok for chemical DVT prophylaxis from surgical standpoint Foley - none   LOS: 1 day    Franne Forts , [facility] 03/16/2018, 7:59 AM Pager: [phone] Consults: [phone] Mon 7:00 am -11:30 AM Tues-Fri 7:00 am-4:30 pm Sat-Sun 7:00 am-11:30 am

## 2018-03-16 NOTE — Progress Notes (Signed)
PROGRESS NOTE  Dorothy Haynes ZOX:[id number] DOB: April 27, 1986 DOA: 03/15/2018 PCP: Leland Her, DO  HPI/Recap of past 30 hours: 32 year old female with history of hypertension, obesity, presents to the ER complaining of left lower quadrant abdominal pain for the past few days.  Patient denied any fever/chills, diarrhea, nausea/vomiting, chest pain.  In the ED, CT abdomen/pelvis showed sigmoid diverticulitis with microperforation.  General surgery consulted.  Patient admitted for further management.  Today, patient reported abdominal pain is improving with pain meds, denies any nausea/vomiting, fever/chills, chest pain.  Assessment/Plan: Principal Problem:   Acute diverticulitis of intestine Active Problems:   Morbid obesity (HCC)   Essential hypertension  Acute sigmoid diverticulitis with microperforation Currently afebrile, with leukocytosis No blood culture drawn on admission CT abdomen showed acute mid sigmoid diverticulitis complicated by tiny contained microperforations with extraluminal gas seen. No abscess or bowel obstruction General surgery on board N.p.o., IV fluids, pain management Continue IV Zosyn Monitor closely  Hypertension Controlled Continue IV hydralazine PRN Hold home HCT  Prediabetes Last A1c 12/2016-->6.2 Repeat A1c pending Accu-Cheks  Normocytic anemia ??Dilutional on IV fluids Daily CBC    Code Status: Full  Family Communication: None at bedside  Disposition Plan: Home once stable   Consultants:  General surgery  Procedures:  None  Antimicrobials:  IV Zosyn  DVT prophylaxis: Lovenox   Objective: Vitals:   03/15/18 2130 03/15/18 2238 03/16/18 0606 03/16/18 1323  BP: 138/80 121/70 118/61 107/70  Pulse: 92 89 92 84  Resp: 20 17 17 17   Temp:  98.9 F (37.2 C) 98.7 F (37.1 C) 98.4 F (36.9 C)  TempSrc:  Oral Oral   SpO2: 99% 100% 95%     Intake/Output Summary (Last 24 hours) at 03/16/2018 1417 Last data filed at  03/16/2018 1329 Gross per 24 hour  Intake 678.33 ml  Output -  Net 678.33 ml   There were no vitals filed for this visit.  Exam:   General: NAD  Cardiovascular: S1, S2 present  Respiratory: CTA B  Abdomen: Soft, obese, TTP in LLQ, BS present  Musculoskeletal: No pedal edema bilaterally  Skin: Normal  Psychiatry: Normal mood   Data Reviewed: CBC: Recent Labs  Lab 03/15/18 1250 03/16/18 0515  WBC 15.8* 14.6*  NEUTROABS  --  10.7*  HGB 12.0 10.5*  HCT 36.4 31.9*  MCV 87.1 87.6  PLT 518* 424*   Basic Metabolic Panel: Recent Labs  Lab 03/15/18 1250 03/16/18 0515  NA 138 138  K 3.8 3.7  CL 100 102  CO2 27 28  GLUCOSE 158* 173*  BUN 10 7  CREATININE 0.61 0.58  CALCIUM 9.0 8.2*   GFR: CrCl cannot be calculated (Unknown ideal weight.). Liver Function Tests: Recent Labs  Lab 03/15/18 1250 03/16/18 0515  AST 15 21  ALT 18 20  ALKPHOS 72 60  BILITOT 0.6 0.4  PROT 7.7 6.6  ALBUMIN 3.7 3.1*   Recent Labs  Lab 03/15/18 1250  LIPASE 39   No results for input(s): AMMONIA in the last 168 hours. Coagulation Profile: No results for input(s): INR, PROTIME in the last 168 hours. Cardiac Enzymes: No results for input(s): CKTOTAL, CKMB, CKMBINDEX, TROPONINI in the last 168 hours. BNP (last 3 results) No results for input(s): PROBNP in the last 8760 hours. HbA1C: No results for input(s): HGBA1C in the last 72 hours. CBG: Recent Labs  Lab 03/16/18 0117 03/16/18 0604 03/16/18 1152  GLUCAP 147* 153* 130*   Lipid Profile: No results for input(s): CHOL, HDL,  LDLCALC, TRIG, CHOLHDL, LDLDIRECT in the last 72 hours. Thyroid Function Tests: No results for input(s): TSH, T4TOTAL, FREET4, T3FREE, THYROIDAB in the last 72 hours. Anemia Panel: No results for input(s): VITAMINB12, FOLATE, FERRITIN, TIBC, IRON, RETICCTPCT in the last 72 hours. Urine analysis:    Component Value Date/Time   COLORURINE YELLOW 03/15/2018 1250   APPEARANCEUR HAZY (A) 03/15/2018  1250   LABSPEC 1.020 03/15/2018 1250   PHURINE 5.0 03/15/2018 1250   GLUCOSEU NEGATIVE 03/15/2018 1250   HGBUR NEGATIVE 03/15/2018 1250   BILIRUBINUR NEGATIVE 03/15/2018 1250   KETONESUR NEGATIVE 03/15/2018 1250   PROTEINUR NEGATIVE 03/15/2018 1250   UROBILINOGEN 0.2 02/20/2015 1630   NITRITE NEGATIVE 03/15/2018 1250   LEUKOCYTESUR NEGATIVE 03/15/2018 1250   Sepsis Labs: @LABRCNTIP (procalcitonin:4,lacticidven:4)  ) Recent Results (from the past 240 hour(s))  Wet prep, genital     Status: Abnormal   Collection Time: 03/15/18  6:44 PM  Result Value Ref Range Status   Yeast Wet Prep HPF POC NONE SEEN NONE SEEN Final   Trich, Wet Prep NONE SEEN NONE SEEN Final   Clue Cells Wet Prep HPF POC NONE SEEN NONE SEEN Final   WBC, Wet Prep HPF POC MANY (A) NONE SEEN Final   Sperm NONE SEEN  Final    Comment: Performed at [facility], [address] [place], [place] [id number]      Studies: Ct Abdomen Pelvis W Contrast  Result Date: 03/15/2018 CLINICAL DATA:  Lower abdominal cramping. EXAM: CT ABDOMEN AND PELVIS WITH CONTRAST TECHNIQUE: Multidetector CT imaging of the abdomen and pelvis was performed using the standard protocol following bolus administration of intravenous contrast. CONTRAST:  100mL ISOVUE-300 IOPAMIDOL (ISOVUE-300) INJECTION 61% COMPARISON:  08/15/2016 FINDINGS: Lower chest: Normal size heart without pericardial effusion. Clear lung bases with minimal dependent atelectasis. No pulmonary consolidation, mass, effusion or pneumothorax. Hepatobiliary: Homogeneous enhancement of the liver without space-occupying mass. No biliary dilatation. Status post cholecystectomy. Pancreas: Normal Spleen: Normal Adrenals/Urinary Tract: Adrenal glands are unremarkable. Kidneys are normal, without renal calculi, focal lesion, or hydronephrosis. Bladder is unremarkable. Stomach/Bowel: Pericolonic inflammation along the mid sigmoid colon consistent with acute diverticulitis  complicated by tiny microperforations characterized by small foci of adjacent contain mesenteric gas. No pericolonic abscess. No bowel obstruction. The remainder of the gastrointestinal system is unremarkable. Normal appearing appendix is visualized. Vascular/Lymphatic: No significant vascular findings are present. No enlarged abdominal or pelvic lymph nodes. Reproductive: The uterus is unremarkable. Bilateral physiologic sized follicles are seen within the ovaries. Other: No free fluid. Musculoskeletal: No acute or significant osseous findings. IMPRESSION: Acute mid sigmoid diverticulitis complicated by tiny contained microperforations with extraluminal gas seen. No abscess or bowel obstruction. These results were called by telephone at the time of interpretation on 03/15/2018 at 7:32 pm to PA [facility] , who verbally acknowledged these results. Electronically Signed   By: Tollie Ethavid  Kwon M.D.   On: 03/15/2018 19:32    Scheduled Meds: . enoxaparin (LOVENOX) injection  40 mg Subcutaneous Daily    Continuous Infusions: . dextrose 5 % and 0.9% NaCl 1,000 mL (03/15/18 2313)  . piperacillin-tazobactam (ZOSYN)  IV 3.375 g (03/16/18 1317)     LOS: 1 day     Briant CedarNkeiruka J Jacinto Keil, MD [facility]  If 7PM-7AM, please contact night-coverage [email or web address] Password [email or web address] 03/16/2018, 2:17 PM

## 2018-03-16 NOTE — Progress Notes (Signed)
Patient has refused to ambulate in hall today. Lina SarBeth Abiel Antrim, RN

## 2018-03-17 LAB — HEMOGLOBIN A1C
HEMOGLOBIN A1C: 7.3 % — AB (ref 4.8–5.6)
Mean Plasma Glucose: 162.81 mg/dL

## 2018-03-17 LAB — CBC
HCT: 32.9 % — ABNORMAL LOW (ref 36.0–46.0)
Hemoglobin: 10.6 g/dL — ABNORMAL LOW (ref 12.0–15.0)
MCH: 28.4 pg (ref 26.0–34.0)
MCHC: 32.2 g/dL (ref 30.0–36.0)
MCV: 88.2 fL (ref 78.0–100.0)
PLATELETS: 465 10*3/uL — AB (ref 150–400)
RBC: 3.73 MIL/uL — AB (ref 3.87–5.11)
RDW: 13.5 % (ref 11.5–15.5)
WBC: 15.3 10*3/uL — AB (ref 4.0–10.5)

## 2018-03-17 LAB — BASIC METABOLIC PANEL
ANION GAP: 8 (ref 5–15)
BUN: 5 mg/dL — ABNORMAL LOW (ref 6–20)
CALCIUM: 8.1 mg/dL — AB (ref 8.9–10.3)
CO2: 28 mmol/L (ref 22–32)
Chloride: 102 mmol/L (ref 98–111)
Creatinine, Ser: 0.57 mg/dL (ref 0.44–1.00)
GFR calc Af Amer: >60 mL/min (ref >60)
GLUCOSE: 156 mg/dL — AB (ref 70–99)
POTASSIUM: 3.6 mmol/L (ref 3.5–5.1)
SODIUM: 138 mmol/L (ref 135–145)

## 2018-03-17 LAB — GLUCOSE, CAPILLARY
GLUCOSE-CAPILLARY: 106 mg/dL — AB (ref 70–99)
GLUCOSE-CAPILLARY: 143 mg/dL — AB (ref 70–99)
GLUCOSE-CAPILLARY: 98 mg/dL (ref 70–99)
Glucose-Capillary: 90 mg/dL (ref 70–99)

## 2018-03-17 MED ORDER — LIVING WELL WITH DIABETES BOOK
Freq: Once | Status: AC
  Administered 2018-03-17: 17:00:00
  Filled 2018-03-17: qty 1

## 2018-03-17 NOTE — Progress Notes (Signed)
Inpatient Diabetes Program Recommendations  AACE/ADA: New Consensus Statement on Inpatient Glycemic Control (2015)  Target Ranges:  Prepandial:   less than 140 mg/dL      Peak postprandial:   less than 180 mg/dL (1-2 hours)      Critically ill patients:  140 - 180 mg/dL   Lab Results  Component Value Date   GLUCAP 143 (H) 03/17/2018   HGBA1C 7.3 (H) 03/17/2018    Review of Glycemic Control  Diabetes history: Prediabetes Outpatient Diabetes medications: None Current orders for Inpatient glycemic control: None  Inpatient Diabetes Program Recommendations:    Will order living well with diabetes and dietician consult. Noted patient now meets criteria for new onset type 2 diabetes.   Thank you, Dorothy FischerJudy E. Cage Gupton, RN, MSN, CDE  Diabetes Coordinator Inpatient Glycemic Control Team Team Pager [phone] (8am-5pm) 03/17/2018 4:17 PM

## 2018-03-17 NOTE — Progress Notes (Signed)
[facility] Progress Note     Subjective: CC-  Still having some LLQ pain, but it is a little better than yesterday. Denies n/v. Passing flatus, no BM. Did not get out of bed yesterday.  Objective: Vital signs in last 24 hours: Temp:  [98.4 F (36.9 C)-99.1 F (37.3 C)] 99.1 F (37.3 C) (07/17 0606) Pulse Rate:  [84-96] 96 (07/17 0606) Resp:  [17] 17 (07/17 0606) BP: (107-130)/(66-108) 111/66 (07/17 0606) SpO2:  [99 %-100 %] 99 % (07/17 0606) Weight:  [267 lb (121.1 kg)] 267 lb (121.1 kg) (07/17 0631) Last BM Date: 03/15/18  Intake/Output from previous day: 07/16 0701 - 07/17 0700 In: 2451.7 [I.V.:2301.7; IV Piggyback:150] Out: -  Intake/Output this shift: No intake/output data recorded.  PE: Gen:  Alert, NAD HEENT: EOM's intact, pupils equal and round Card:  RRR, no M/G/R heard Pulm:  CTAB, no W/R/R, effort normal Abd: obese, soft, mild LLQ and suprapubic TTP without rebound or guarding, +BS, no HSM, no hernia Psych: A&Ox3  Skin: no rashes noted, warm and dry  Lab Results:  Recent Labs    03/16/18 0515 03/17/18 0452  WBC 14.6* 15.3*  HGB 10.5* 10.6*  HCT 31.9* 32.9*  PLT 424* 465*   BMET Recent Labs    03/16/18 0515 03/17/18 0452  NA 138 138  K 3.7 3.6  CL 102 102  CO2 28 28  GLUCOSE 173* 156*  BUN 7 5*  CREATININE 0.58 0.57  CALCIUM 8.2* 8.1*   PT/INR No results for input(s): LABPROT, INR in the last 72 hours. CMP     Component Value Date/Time   NA 138 03/17/2018 0452   K 3.6 03/17/2018 0452   CL 102 03/17/2018 0452   CO2 28 03/17/2018 0452   GLUCOSE 156 (H) 03/17/2018 0452   BUN 5 (L) 03/17/2018 0452   CREATININE 0.57 03/17/2018 0452   CREATININE 0.64 09/30/2016 1123   CALCIUM 8.1 (L) 03/17/2018 0452   PROT 6.6 03/16/2018 0515   ALBUMIN 3.1 (L) 03/16/2018 0515   AST 21 03/16/2018 0515   ALT 20 03/16/2018 0515   ALKPHOS 60 03/16/2018 0515   BILITOT 0.4 03/16/2018 0515   GFRNONAA >60 03/17/2018 0452   GFRNONAA >89  09/30/2016 1123   GFRAA >60 03/17/2018 0452   GFRAA >89 09/30/2016 1123   Lipase     Component Value Date/Time   LIPASE 39 03/15/2018 1250       Studies/Results: Ct Abdomen Pelvis W Contrast  Result Date: 03/15/2018 CLINICAL DATA:  Lower abdominal cramping. EXAM: CT ABDOMEN AND PELVIS WITH CONTRAST TECHNIQUE: Multidetector CT imaging of the abdomen and pelvis was performed using the standard protocol following bolus administration of intravenous contrast. CONTRAST:  100mL ISOVUE-300 IOPAMIDOL (ISOVUE-300) INJECTION 61% COMPARISON:  08/15/2016 FINDINGS: Lower chest: Normal size heart without pericardial effusion. Clear lung bases with minimal dependent atelectasis. No pulmonary consolidation, mass, effusion or pneumothorax. Hepatobiliary: Homogeneous enhancement of the liver without space-occupying mass. No biliary dilatation. Status post cholecystectomy. Pancreas: Normal Spleen: Normal Adrenals/Urinary Tract: Adrenal glands are unremarkable. Kidneys are normal, without renal calculi, focal lesion, or hydronephrosis. Bladder is unremarkable. Stomach/Bowel: Pericolonic inflammation along the mid sigmoid colon consistent with acute diverticulitis complicated by tiny microperforations characterized by small foci of adjacent contain mesenteric gas. No pericolonic abscess. No bowel obstruction. The remainder of the gastrointestinal system is unremarkable. Normal appearing appendix is visualized. Vascular/Lymphatic: No significant vascular findings are present. No enlarged abdominal or pelvic lymph nodes. Reproductive: The uterus is unremarkable. Bilateral physiologic sized  follicles are seen within the ovaries. Other: No free fluid. Musculoskeletal: No acute or significant osseous findings. IMPRESSION: Acute mid sigmoid diverticulitis complicated by tiny contained microperforations with extraluminal gas seen. No abscess or bowel obstruction. These results were called by telephone at the time of  interpretation on 03/15/2018 at 7:32 pm to PA [facility] , who verbally acknowledged these results. Electronically Signed   By: Tollie Eth M.D.   On: 03/15/2018 19:32    Anti-infectives: Anti-infectives (From admission, onward)   Start     Dose/Rate Route Frequency Ordered Stop   03/16/18 0600  piperacillin-tazobactam (ZOSYN) IVPB 3.375 g     3.375 g 12.5 mL/hr over 240 Minutes Intravenous Every 8 hours 03/15/18 2121     03/15/18 1945  piperacillin-tazobactam (ZOSYN) IVPB 3.375 g     3.375 g 100 mL/hr over 30 Minutes Intravenous  Once 03/15/18 1941 03/15/18 2101       Assessment/Plan HTN Morbid obesity Tobacco abuse  Diverticulitis with microperforation - first bout of diverticulitis, never had a colonoscopy before - CT 7/15 showed acute mid sigmoid diverticulitis complicated by tiny contained microperforations with extraluminal gas seen. No abscess or bowel obstruction  ID - zosyn 7/15>> FEN - IVF, NPO VTE - SCDs, lovenox Foley - none  Plan: WBC slightly up 15.3 from 14.6, TMAX 99.1, still mild LLQ TTP on exam. Continue bowel rest and IV abx. Discussed importance of ambulating with patient, agreeable to get OOB later today.    LOS: 2 days    Franne Forts , [facility] 03/17/2018, 8:13 AM Pager: [phone] Consults: [phone] Mon 7:00 am -11:30 AM Tues-Fri 7:00 am-4:30 pm Sat-Sun 7:00 am-11:30 am

## 2018-03-17 NOTE — Progress Notes (Signed)
PROGRESS NOTE  Dorothy Haynes ZOX:[id number] DOB: 02/27/1986 DOA: 03/15/2018 PCP: Leland HerYoo, Elsia J, DO  HPI/Recap of past 24 hours:  [age] fem with history of hypertension, obesity, presents to the ER complaining of left lower quadrant abdominal pain for the past few days.  Patient denied any fever/chills, diarrhea, nausea/vomiting, chest pain.  In the ED, CT abdomen/pelvis showed sigmoid diverticulitis with microperforation.  General surgery consulted.  Patient admitted for further management.   SUBJECTIVE Overall improving with pain meds, No stool Hungry and is npo No cp No fever No n/v.  Assessment/Plan: Principal Problem:   Acute diverticulitis of intestine Active Problems:   Morbid obesity (HCC)   Essential hypertension  Acute sigmoid diverticulitis with microperforation Currently afebrile, with leukocytosis which is chronic No blood culture drawn on admission CT abdomen showed acute mid sigmoid diverticulitis complicated by tiny contained microperforations with extraluminal gas seen. No abscess or bowel obstruction General surgery on board-N.p.o., IV fluids, pain management Continue IV Zosyn now   Hypertension Controlled Continue IV hydralazine PRN Hold home HCT  Prediabetes Last A1c 12/2016-->6.2 currently 7.3 Accu-Cheks controlled in the hospital is reasonable between 106 and 156 Tried metformkin and hasnt tired anything else Will cc PCP  Normocytic anemia ??Dilutional on IV fluids Daily CBC  Smoker counselled--pre-contemplative  [facility] habitus Snores-suggest OP stop-bang or [place]  Code Status: Full  Family Communication: None at bedside  Disposition Plan: Home once stable   Consultants:  General surgery  Procedures:  None  Antimicrobials:  IV Zosyn  DVT prophylaxis: Lovenox   Objective: Vitals:   03/16/18 1528 03/16/18 2053 03/17/18 0606 03/17/18 0631  BP:  (!) 130/108 111/66   Pulse:  86 96   Resp:  17 17   Temp:   99.1 F (37.3  C)   TempSrc:   Oral   SpO2:  100% 99%   Weight: 121.1 kg (267 lb)   121.1 kg (267 lb)  Height: 5\' 3"  (1.6 m)       Intake/Output Summary (Last 24 hours) at 03/17/2018 1113 Last data filed at 03/17/2018 0550 Gross per 24 hour  Intake 2051.66 ml  Output -  Net 2051.66 ml   Filed Weights   03/16/18 1528 03/17/18 0631  Weight: 121.1 kg (267 lb) 121.1 kg (267 lb)    Exam:  Obese awake no distress Hungry cta b s1 s 2no m/r/g cta b without added sound Some tender in LQ No rebound no guard [place] 3 No le edema    Data Reviewed: CBC: Recent Labs  Lab 03/15/18 1250 03/16/18 0515 03/17/18 0452  WBC 15.8* 14.6* 15.3*  NEUTROABS  --  10.7*  --   HGB 12.0 10.5* 10.6*  HCT 36.4 31.9* 32.9*  MCV 87.1 87.6 88.2  PLT 518* 424* 465*   Basic Metabolic Panel: Recent Labs  Lab 03/15/18 1250 03/16/18 0515 03/17/18 0452  NA 138 138 138  K 3.8 3.7 3.6  CL 100 102 102  CO2 27 28 28   GLUCOSE 158* 173* 156*  BUN 10 7 5*  CREATININE 0.61 0.58 0.57  CALCIUM 9.0 8.2* 8.1*   GFR: Estimated Creatinine Clearance: 126.2 mL/min (by C-G formula based on SCr of 0.57 mg/dL). Liver Function Tests: Recent Labs  Lab 03/15/18 1250 03/16/18 0515  AST 15 21  ALT 18 20  ALKPHOS 72 60  BILITOT 0.6 0.4  PROT 7.7 6.6  ALBUMIN 3.7 3.1*   Recent Labs  Lab 03/15/18 1250  LIPASE 39   No results for input(s):  AMMONIA in the last 168 hours. Coagulation Profile: No results for input(s): INR, PROTIME in the last 168 hours. Cardiac Enzymes: No results for input(s): CKTOTAL, CKMB, CKMBINDEX, TROPONINI in the last 168 hours. BNP (last 3 results) No results for input(s): PROBNP in the last 8760 hours. HbA1C: Recent Labs    03/17/18 0452  HGBA1C 7.3*   CBG: Recent Labs  Lab 03/16/18 1152 03/16/18 1841 03/16/18 1958 03/16/18 2052 03/17/18 0004  GLUCAP 130* 85 46* 135* 106*   Lipid Profile: No results for input(s): CHOL, HDL, LDLCALC, TRIG, CHOLHDL, LDLDIRECT in the  last 72 hours. Thyroid Function Tests: No results for input(s): TSH, T4TOTAL, FREET4, T3FREE, THYROIDAB in the last 72 hours. Anemia Panel: No results for input(s): VITAMINB12, FOLATE, FERRITIN, TIBC, IRON, RETICCTPCT in the last 72 hours. Urine analysis:    Component Value Date/Time   COLORURINE YELLOW 03/15/2018 1250   APPEARANCEUR HAZY (A) 03/15/2018 1250   LABSPEC 1.020 03/15/2018 1250   PHURINE 5.0 03/15/2018 1250   GLUCOSEU NEGATIVE 03/15/2018 1250   HGBUR NEGATIVE 03/15/2018 1250   BILIRUBINUR NEGATIVE 03/15/2018 1250   KETONESUR NEGATIVE 03/15/2018 1250   PROTEINUR NEGATIVE 03/15/2018 1250   UROBILINOGEN 0.2 02/20/2015 1630   NITRITE NEGATIVE 03/15/2018 1250   LEUKOCYTESUR NEGATIVE 03/15/2018 1250   Sepsis Labs: @LABRCNTIP (procalcitonin:4,lacticidven:4)  ) Recent Results (from the past 240 hour(s))  Wet prep, genital     Status: Abnormal   Collection Time: 03/15/18  6:44 PM  Result Value Ref Range Status   Yeast Wet Prep HPF POC NONE SEEN NONE SEEN Final   Trich, Wet Prep NONE SEEN NONE SEEN Final   Clue Cells Wet Prep HPF POC NONE SEEN NONE SEEN Final   WBC, Wet Prep HPF POC MANY (A) NONE SEEN Final   Sperm NONE SEEN  Final    Comment: Performed at [facility], [address] [place], [place] [address]      Studies: No results found.  Scheduled Meds: . enoxaparin (LOVENOX) injection  60 mg Subcutaneous Daily  .  morphine injection  2 mg Intravenous Once    Continuous Infusions: . dextrose 5 % and 0.9% NaCl 100 mL/hr at 03/16/18 2141  . piperacillin-tazobactam (ZOSYN)  IV Stopped (03/17/18 0936)     LOS: 2 days     Rhetta Mura, MD [facility]  If 7PM-7AM, please contact night-coverage [email or web address] Password [email or web address] 03/17/2018, 11:13 AM

## 2018-03-18 LAB — CBC
HCT: 32.2 % — ABNORMAL LOW (ref 36.0–46.0)
Hemoglobin: 10.4 g/dL — ABNORMAL LOW (ref 12.0–15.0)
MCH: 28.2 pg (ref 26.0–34.0)
MCHC: 32.3 g/dL (ref 30.0–36.0)
MCV: 87.3 fL (ref 78.0–100.0)
PLATELETS: 444 10*3/uL — AB (ref 150–400)
RBC: 3.69 MIL/uL — ABNORMAL LOW (ref 3.87–5.11)
RDW: 13.1 % (ref 11.5–15.5)
WBC: 13.1 10*3/uL — ABNORMAL HIGH (ref 4.0–10.5)

## 2018-03-18 LAB — GLUCOSE, CAPILLARY
GLUCOSE-CAPILLARY: 141 mg/dL — AB (ref 70–99)
GLUCOSE-CAPILLARY: 84 mg/dL (ref 70–99)
Glucose-Capillary: 174 mg/dL — ABNORMAL HIGH (ref 70–99)
Glucose-Capillary: 106 mg/dL — ABNORMAL HIGH (ref 70–99)

## 2018-03-18 LAB — BASIC METABOLIC PANEL
Anion gap: 9 (ref 5–15)
CALCIUM: 8.1 mg/dL — AB (ref 8.9–10.3)
CO2: 25 mmol/L (ref 22–32)
CREATININE: 0.51 mg/dL (ref 0.44–1.00)
Chloride: 104 mmol/L (ref 98–111)
GFR calc Af Amer: >60 mL/min (ref >60)
GLUCOSE: 150 mg/dL — AB (ref 70–99)
POTASSIUM: 3.5 mmol/L (ref 3.5–5.1)
SODIUM: 138 mmol/L (ref 135–145)

## 2018-03-18 NOTE — Plan of Care (Signed)
  RD consulted for nutrition education regarding pre-diabetes. Patient unaware of diabetes diagnosis per DM coordinator note.  Lab Results  Component Value Date   HGBA1C 7.3 (H) 03/17/2018    RD provided "Carbohydrate Counting for People with Diabetes" handout from the Academy of Nutrition and Dietetics. Discussed different food groups and their effects on blood sugar, emphasizing carbohydrate-containing foods. Provided list of carbohydrates and recommended serving sizes of common foods.  Discussed importance of controlled and consistent carbohydrate intake throughout the day. Provided examples of ways to balance meals/snacks and encouraged intake of high-fiber, whole grain complex carbohydrates. Teach back method used.  Expect poor compliance. Without formal diagnosis, pt did not seem interested in applying dietary changes. Reports her mother has diabetes and she tries to help her with her diet. Pt very aware of foods to avoid given her diverticulitis.  Body mass index is 47.28 kg/m. Pt meets criteria for morbid obesity based on current BMI.  Current diet order is clear liquid, patient is consuming approximately 50% of meals at this time. Labs and medications reviewed. No further nutrition interventions warranted at this time. If additional nutrition issues arise, please re-consult RD.  Tilda FrancoLindsey Teonia Yager, MS, RD, LDN [facility] Clinical Dietitian Pager: [phone] After Hours Pager: [phone]

## 2018-03-18 NOTE — Progress Notes (Signed)
PROGRESS NOTE  Dorothy Haynes ZOX:[id number] DOB: 10/18/1985 DOA: 03/15/2018 PCP: Leland HerYoo, Elsia J, DO  HPI/Recap of past 24 hours:  [age] fem with history of hypertension, obesity, presents to the ER complaining of left lower quadrant abdominal pain for the past few days.  Patient denied any fever/chills, diarrhea, nausea/vomiting, chest pain.  In the ED, CT abdomen/pelvis showed sigmoid diverticulitis with microperforation.  General surgery consulted.  Patient admitted for further management.   SUBJECTIVE Awake alert pleasant oriented no distress Tolerating clear liquids but has had 6 loose stools since then which are fall No fever overall no chills  Assessment/Plan: Principal Problem:   Acute diverticulitis of intestine Active Problems:   Morbid obesity (HCC)   Essential hypertension  Acute sigmoid diverticulitis with microperforation Currently afebrile, with leukocytosis which is chronic No blood culture drawn on admission CT abdomen showed acute mid sigmoid diverticulitis complicated by tiny contained microperforations with extraluminal gas seen. No abscess or bowel obstruction General surgery directing diet and IV antibiotics-Continue IV Zosyn now  COMMENT If continues to have diarrhea low threshold to check C. difficile  Hypertension Controlled Continue IV hydralazine PRN Hold home HCT  Prediabetes Last A1c 12/2016-->6.2 currently 7.3 Accu-Cheks controlled in the hospital 90-140 Tried metformin in the past  Normocytic anemia ??Dilutional on IV fluids Daily CBC  Smoker counselled--pre-contemplative  [facility] habitus Snores-suggest OP stop-bang or Epworth  Code Status: Full  Family Communication: None at bedside  Disposition Plan: Home once stable   Consultants:  General surgery  Procedures:  None  Antimicrobials:  IV Zosyn  DVT prophylaxis: Lovenox   Objective: Vitals:   03/17/18 0631 03/17/18 1500 03/17/18 2118 03/18/18 0511  BP:  128/75  132/75 114/67  Pulse:  86 85 95  Resp:  18 14 14   Temp:  98.8 F (37.1 C) 98.5 F (36.9 C) 98.3 F (36.8 C)  TempSrc:   Oral Oral  SpO2:  100% 100% 99%  Weight: 121.1 kg (267 lb)   121.1 kg (266 lb 14.4 oz)  Height:        Intake/Output Summary (Last 24 hours) at 03/18/2018 1059 Last data filed at 03/18/2018 1052 Gross per 24 hour  Intake 1395.42 ml  Output -  Net 1395.42 ml   Filed Weights   03/16/18 1528 03/17/18 0631 03/18/18 0511  Weight: 121.1 kg (267 lb) 121.1 kg (267 lb) 121.1 kg (266 lb 14.4 oz)    Exam:  Obese awake no distress Mallampati 3 Chest clear Abdomen soft no central tenderness obese cannot appreciate organomegaly no rebound No lower extremity edema no joint enlargement No pallor no icterus No le edema    Data Reviewed: CBC: Recent Labs  Lab 03/15/18 1250 03/16/18 0515 03/17/18 0452 03/18/18 0509  WBC 15.8* 14.6* 15.3* 13.1*  NEUTROABS  --  10.7*  --   --   HGB 12.0 10.5* 10.6* 10.4*  HCT 36.4 31.9* 32.9* 32.2*  MCV 87.1 87.6 88.2 87.3  PLT 518* 424* 465* 444*   Basic Metabolic Panel: Recent Labs  Lab 03/15/18 1250 03/16/18 0515 03/17/18 0452 03/18/18 0509  NA 138 138 138 138  K 3.8 3.7 3.6 3.5  CL 100 102 102 104  CO2 27 28 28 25   GLUCOSE 158* 173* 156* 150*  BUN 10 7 5* <5*  CREATININE 0.61 0.58 0.57 0.51  CALCIUM 9.0 8.2* 8.1* 8.1*   GFR: Estimated Creatinine Clearance: 126.2 mL/min (by C-G formula based on SCr of 0.51 mg/dL). Liver Function Tests: Recent Labs  Lab 03/15/18  1250 03/16/18 0515  AST 15 21  ALT 18 20  ALKPHOS 72 60  BILITOT 0.6 0.4  PROT 7.7 6.6  ALBUMIN 3.7 3.1*   Recent Labs  Lab 03/15/18 1250  LIPASE 39   No results for input(s): AMMONIA in the last 168 hours. Coagulation Profile: No results for input(s): INR, PROTIME in the last 168 hours. Cardiac Enzymes: No results for input(s): CKTOTAL, CKMB, CKMBINDEX, TROPONINI in the last 168 hours. BNP (last 3 results) No results for input(s):  PROBNP in the last 8760 hours. HbA1C: Recent Labs    03/17/18 0452  HGBA1C 7.3*   CBG: Recent Labs  Lab 03/17/18 0004 03/17/18 1147 03/17/18 1756 03/17/18 2341 03/18/18 0513  GLUCAP 106* 143* 98 90 141*   Lipid Profile: No results for input(s): CHOL, HDL, LDLCALC, TRIG, CHOLHDL, LDLDIRECT in the last 72 hours. Thyroid Function Tests: No results for input(s): TSH, T4TOTAL, FREET4, T3FREE, THYROIDAB in the last 72 hours. Anemia Panel: No results for input(s): VITAMINB12, FOLATE, FERRITIN, TIBC, IRON, RETICCTPCT in the last 72 hours. Urine analysis:    Component Value Date/Time   COLORURINE YELLOW 03/15/2018 1250   APPEARANCEUR HAZY (A) 03/15/2018 1250   LABSPEC 1.020 03/15/2018 1250   PHURINE 5.0 03/15/2018 1250   GLUCOSEU NEGATIVE 03/15/2018 1250   HGBUR NEGATIVE 03/15/2018 1250   BILIRUBINUR NEGATIVE 03/15/2018 1250   KETONESUR NEGATIVE 03/15/2018 1250   PROTEINUR NEGATIVE 03/15/2018 1250   UROBILINOGEN 0.2 02/20/2015 1630   NITRITE NEGATIVE 03/15/2018 1250   LEUKOCYTESUR NEGATIVE 03/15/2018 1250   Sepsis Labs: @LABRCNTIP (procalcitonin:4,lacticidven:4)  ) Recent Results (from the past 240 hour(s))  Wet prep, genital     Status: Abnormal   Collection Time: 03/15/18  6:44 PM  Result Value Ref Range Status   Yeast Wet Prep HPF POC NONE SEEN NONE SEEN Final   Trich, Wet Prep NONE SEEN NONE SEEN Final   Clue Cells Wet Prep HPF POC NONE SEEN NONE SEEN Final   WBC, Wet Prep HPF POC MANY (A) NONE SEEN Final   Sperm NONE SEEN  Final    Comment: Performed at [facility], [address] [place], [place] [address]      Studies: No results found.  Scheduled Meds: . enoxaparin (LOVENOX) injection  60 mg Subcutaneous Daily  .  morphine injection  2 mg Intravenous Once    Continuous Infusions: . dextrose 5 % and 0.9% NaCl 100 mL/hr at 03/18/18 0554  . piperacillin-tazobactam (ZOSYN)  IV Stopped (03/18/18 0954)     LOS: 3 days      Rhetta Mura, MD [facility]  If 7PM-7AM, please contact night-coverage [email or web address] Password [email or web address] 03/18/2018, 10:59 AM

## 2018-03-18 NOTE — Progress Notes (Signed)
[facility] Progress Note     Subjective: CC-  Patient states that she has no abdominal pain at rest. Still mildly tender to touch, but overall feels much better. Denies n/v. Reports having a few loose BMs yesterday.  Objective: Vital signs in last 24 hours: Temp:  [98.3 F (36.8 C)-98.8 F (37.1 C)] 98.3 F (36.8 C) (07/18 0511) Pulse Rate:  [85-95] 95 (07/18 0511) Resp:  [14-18] 14 (07/18 0511) BP: (114-132)/(67-75) 114/67 (07/18 0511) SpO2:  [99 %-100 %] 99 % (07/18 0511) Weight:  [266 lb 14.4 oz (121.1 kg)] 266 lb 14.4 oz (121.1 kg) (07/18 0511) Last BM Date: 03/15/18  Intake/Output from previous day: 07/17 0701 - 07/18 0700 In: 975.4 [I.V.:916.7; IV Piggyback:58.8] Out: -  Intake/Output this shift: No intake/output data recorded.  PE: Gen: Alert, NAD HEENT: EOM's intact, pupils equal and round Card: RRR Pulm: CTAB, no W/R/R, effort normal ZOX:WRUEA, soft,nontender, +BS, no HSM, no hernia Psych: A&Ox3  Skin: no rashes noted, warm and dry  Lab Results:  Recent Labs    03/17/18 0452 03/18/18 0509  WBC 15.3* 13.1*  HGB 10.6* 10.4*  HCT 32.9* 32.2*  PLT 465* 444*   BMET Recent Labs    03/17/18 0452 03/18/18 0509  NA 138 138  K 3.6 3.5  CL 102 104  CO2 28 25  GLUCOSE 156* 150*  BUN 5* <5*  CREATININE 0.57 0.51  CALCIUM 8.1* 8.1*   PT/INR No results for input(s): LABPROT, INR in the last 72 hours. CMP     Component Value Date/Time   NA 138 03/18/2018 0509   K 3.5 03/18/2018 0509   CL 104 03/18/2018 0509   CO2 25 03/18/2018 0509   GLUCOSE 150 (H) 03/18/2018 0509   BUN <5 (L) 03/18/2018 0509   CREATININE 0.51 03/18/2018 0509   CREATININE 0.64 09/30/2016 1123   CALCIUM 8.1 (L) 03/18/2018 0509   PROT 6.6 03/16/2018 0515   ALBUMIN 3.1 (L) 03/16/2018 0515   AST 21 03/16/2018 0515   ALT 20 03/16/2018 0515   ALKPHOS 60 03/16/2018 0515   BILITOT 0.4 03/16/2018 0515   GFRNONAA >60 03/18/2018 0509   GFRNONAA >89 09/30/2016 1123    GFRAA >60 03/18/2018 0509   GFRAA >89 09/30/2016 1123   Lipase     Component Value Date/Time   LIPASE 39 03/15/2018 1250       Studies/Results: No results found.  Anti-infectives: Anti-infectives (From admission, onward)   Start     Dose/Rate Route Frequency Ordered Stop   03/16/18 0600  piperacillin-tazobactam (ZOSYN) IVPB 3.375 g     3.375 g 12.5 mL/hr over 240 Minutes Intravenous Every 8 hours 03/15/18 2121     03/15/18 1945  piperacillin-tazobactam (ZOSYN) IVPB 3.375 g     3.375 g 100 mL/hr over 30 Minutes Intravenous  Once 03/15/18 1941 03/15/18 2101       Assessment/Plan HTN Morbid obesity Tobacco abuse DM - A1c 7.3  Diverticulitis with microperforation - first bout of diverticulitis, never had a colonoscopy before - CT 7/15 showed acute mid sigmoid diverticulitis complicated by tiny contained microperforations with extraluminal gas seen. No abscess or bowel obstruction  ID -zosyn 7/15>> FEN -IVF, CLD VTE -SCDs, lovenox Foley -none  Plan: WBC trending down 13.1 and patient nontender on exam. Will advance to clear liquids. Continue ambulating. CBC in AM.   LOS: 3 days    Franne Forts , [facility] 03/18/2018, 8:37 AM Pager: [phone] Consults: [phone] Mon 7:00 am -11:30  AM Tues-Fri 7:00 am-4:30 pm Sat-Sun 7:00 am-11:30 am

## 2018-03-19 LAB — CBC
HEMATOCRIT: 31.4 % — AB (ref 36.0–46.0)
HEMOGLOBIN: 10.2 g/dL — AB (ref 12.0–15.0)
MCH: 28.3 pg (ref 26.0–34.0)
MCHC: 32.5 g/dL (ref 30.0–36.0)
MCV: 87.2 fL (ref 78.0–100.0)
Platelets: 439 10*3/uL — ABNORMAL HIGH (ref 150–400)
RBC: 3.6 MIL/uL — ABNORMAL LOW (ref 3.87–5.11)
RDW: 13 % (ref 11.5–15.5)
WBC: 11.9 10*3/uL — ABNORMAL HIGH (ref 4.0–10.5)

## 2018-03-19 LAB — GLUCOSE, CAPILLARY
Glucose-Capillary: 109 mg/dL — ABNORMAL HIGH (ref 70–99)
Glucose-Capillary: 162 mg/dL — ABNORMAL HIGH (ref 70–99)
Glucose-Capillary: 93 mg/dL (ref 70–99)

## 2018-03-19 MED ORDER — OXYCODONE HCL 5 MG PO TABS
5.0000 mg | ORAL_TABLET | ORAL | Status: DC | PRN
  Filled 2018-03-19: qty 1

## 2018-03-19 MED ORDER — MORPHINE SULFATE (PF) 2 MG/ML IV SOLN: 2.0000 mg | INTRAVENOUS | Status: DC | PRN

## 2018-03-19 MED ORDER — HYDROCHLOROTHIAZIDE 12.5 MG PO CAPS
12.5000 mg | ORAL_CAPSULE | Freq: Every day | ORAL | Status: DC
  Administered 2018-03-19 – 2018-03-20 (×2): 12.5 mg via ORAL
  Filled 2018-03-19 (×2): qty 1

## 2018-03-19 NOTE — Progress Notes (Signed)
[facility] Progress Note     Subjective: CC-  Patient states that her LLQ is still a little sore, but continues to feel better each day. Denies n/v. Tolerating full liquids without increased pain. Passing flatus and had another loose BM this morning.  Objective: Vital signs in last 24 hours: Temp:  [98.5 F (36.9 C)-99 F (37.2 C)] 98.9 F (37.2 C) (07/19 0621) Pulse Rate:  [75-82] 82 (07/19 0621) Resp:  [18-19] 18 (07/19 0621) BP: (109-134)/(63-78) 109/63 (07/19 0621) SpO2:  [100 %] 100 % (07/19 0621) Last BM Date: 03/18/18  Intake/Output from previous day: 07/18 0701 - 07/19 0700 In: 1380 [P.O.:1380] Out: -  Intake/Output this shift: No intake/output data recorded.  PE: Gen: Alert, NAD HEENT: EOM's intact, pupils equal and round Card: RRR Pulm: CTAB, no W/R/R, effort normal ZOX:WRUEAAbd:obese, soft,nontender, +BS, no HSM, no hernia Psych: A&Ox3  Skin: no rashes noted, warm and dry  Lab Results:  Recent Labs    03/18/18 0509 03/19/18 0501  WBC 13.1* 11.9*  HGB 10.4* 10.2*  HCT 32.2* 31.4*  PLT 444* 439*   BMET Recent Labs    03/17/18 0452 03/18/18 0509  NA 138 138  K 3.6 3.5  CL 102 104  CO2 28 25  GLUCOSE 156* 150*  BUN 5* <5*  CREATININE 0.57 0.51  CALCIUM 8.1* 8.1*   PT/INR No results for input(s): LABPROT, INR in the last 72 hours. CMP     Component Value Date/Time   NA 138 03/18/2018 0509   K 3.5 03/18/2018 0509   CL 104 03/18/2018 0509   CO2 25 03/18/2018 0509   GLUCOSE 150 (H) 03/18/2018 0509   BUN <5 (L) 03/18/2018 0509   CREATININE 0.51 03/18/2018 0509   CREATININE 0.64 09/30/2016 1123   CALCIUM 8.1 (L) 03/18/2018 0509   PROT 6.6 03/16/2018 0515   ALBUMIN 3.1 (L) 03/16/2018 0515   AST 21 03/16/2018 0515   ALT 20 03/16/2018 0515   ALKPHOS 60 03/16/2018 0515   BILITOT 0.4 03/16/2018 0515   GFRNONAA >60 03/18/2018 0509   GFRNONAA >89 09/30/2016 1123   GFRAA >60 03/18/2018 0509   GFRAA >89 09/30/2016 1123   Lipase      Component Value Date/Time   LIPASE 39 03/15/2018 1250       Studies/Results: No results found.  Anti-infectives: Anti-infectives (From admission, onward)   Start     Dose/Rate Route Frequency Ordered Stop   03/16/18 0600  piperacillin-tazobactam (ZOSYN) IVPB 3.375 g     3.375 g 12.5 mL/hr over 240 Minutes Intravenous Every 8 hours 03/15/18 2121     03/15/18 1945  piperacillin-tazobactam (ZOSYN) IVPB 3.375 g     3.375 g 100 mL/hr over 30 Minutes Intravenous  Once 03/15/18 1941 03/15/18 2101       Assessment/Plan HTN Morbid obesity Tobacco abuse DM - A1c 7.3  Diverticulitis with microperforation - first bout of diverticulitis, never had a colonoscopy before - CT 7/15 showed acute mid sigmoid diverticulitis complicated by tiny contained microperforations with extraluminal gas seen. No abscess or bowel obstruction  ID -zosyn 7/15>> FEN -IVF, soft diet VTE -SCDs,lovenox Foley -none  Plan: WBC trending down (11.9) and patient nontender on exam. Advance to soft diet. Continue ambulating. CBC in AM. If WBC normalized can likely transition to oral antibiotics and discharge.   LOS: 4 days    Franne FortsBrooke A Meuth , [facility] 03/19/2018, 7:57 AM Pager: [phone] Consults: [phone] Mon 7:00 am -11:30 AM Tues-Fri 7:00 am-4:30  pm Sat-Sun 7:00 am-11:30 am

## 2018-03-19 NOTE — Progress Notes (Signed)
PROGRESS NOTE  Dorothy Haynes WJX:[id number] DOB: 01/02/1986 DOA: 03/15/2018 PCP: Leland Her, DO  HPI/Recap of past 24 hours:  46 fem with history of hypertension, obesity, presents to the ER complaining of left lower quadrant abdominal pain for the past few days.  Patient denied any fever/chills, diarrhea, nausea/vomiting, chest pain.  In the ED, CT abdomen/pelvis showed sigmoid diverticulitis with microperforation.  General surgery consulted.  Patient admitted for further management.   SUBJECTIVE Bored Asking to go home Happy to go off unit No cp no fever no diarr now tol soft diet without issue   Assessment/Plan: Principal Problem:   Acute diverticulitis of intestine Active Problems:   Morbid obesity (HCC)   Essential hypertension  Acute sigmoid diverticulitis with microperforation Currently afebrile-Leuk improving daily-labs am CT abdomen showed acute mid sigmoid diverticulitis complicated by tiny contained microperforations with extraluminal gas seen. No abscess or bowel obstruction General surgery directing diet [now soft] and IV antibiotics-Continue IV Zosyn now   Hypertension Controlled  HCTz was on hold--resume 7/19  Prediabetes Last A1c 12/2016-->6.2 currently 7.3 Accu-Cheks controlled in the hospital 84-109-stable no changes Tried metformin in the past  Normocytic anemia ??Dilutional on IV fluids Daily CBC  Smoker counselled--pre-contemplative  [facility] habitus Snores-suggest OP stop-bang or Epworth  Code Status: Full  Family Communication: None at bedside  Disposition Plan: Home once stable   Consultants:  General surgery  Procedures:  None  Antimicrobials:  IV Zosyn  DVT prophylaxis: Lovenox   Objective: Vitals:   03/18/18 0511 03/18/18 1342 03/18/18 2010 03/19/18 0621  BP: 114/67 118/71 134/78 109/63  Pulse: 95 75 81 82  Resp: 14 19 18 18   Temp: 98.3 F (36.8 C) 99 F (37.2 C) 98.5 F (36.9 C) 98.9 F (37.2 C)  TempSrc:  Oral Oral Oral Oral  SpO2: 99% 100% 100% 100%  Weight: 121.1 kg (266 lb 14.4 oz)     Height:        Intake/Output Summary (Last 24 hours) at 03/19/2018 1004 Last data filed at 03/19/2018 0945 Gross per 24 hour  Intake 2040 ml  Output -  Net 2040 ml   Filed Weights   03/16/18 1528 03/17/18 0631 03/18/18 0511  Weight: 121.1 kg (267 lb) 121.1 kg (267 lb) 121.1 kg (266 lb 14.4 oz)    Exam:  Obese awake no distress Mallampati 3 Chest clear Abdomen non tender no rebound no gaurd No lower extremity edema no joint enlargement No pallor no icterus No le edema    Data Reviewed: CBC: Recent Labs  Lab 03/15/18 1250 03/16/18 0515 03/17/18 0452 03/18/18 0509 03/19/18 0501  WBC 15.8* 14.6* 15.3* 13.1* 11.9*  NEUTROABS  --  10.7*  --   --   --   HGB 12.0 10.5* 10.6* 10.4* 10.2*  HCT 36.4 31.9* 32.9* 32.2* 31.4*  MCV 87.1 87.6 88.2 87.3 87.2  PLT 518* 424* 465* 444* 439*   Basic Metabolic Panel: Recent Labs  Lab 03/15/18 1250 03/16/18 0515 03/17/18 0452 03/18/18 0509  NA 138 138 138 138  K 3.8 3.7 3.6 3.5  CL 100 102 102 104  CO2 27 28 28 25   GLUCOSE 158* 173* 156* 150*  BUN 10 7 5* <5*  CREATININE 0.61 0.58 0.57 0.51  CALCIUM 9.0 8.2* 8.1* 8.1*   GFR: Estimated Creatinine Clearance: 126.2 mL/min (by C-G formula based on SCr of 0.51 mg/dL). Liver Function Tests: Recent Labs  Lab 03/15/18 1250 03/16/18 0515  AST 15 21  ALT 18 20  ALKPHOS  72 60  BILITOT 0.6 0.4  PROT 7.7 6.6  ALBUMIN 3.7 3.1*   Recent Labs  Lab 03/15/18 1250  LIPASE 39   No results for input(s): AMMONIA in the last 168 hours. Coagulation Profile: No results for input(s): INR, PROTIME in the last 168 hours. Cardiac Enzymes: No results for input(s): CKTOTAL, CKMB, CKMBINDEX, TROPONINI in the last 168 hours. BNP (last 3 results) No results for input(s): PROBNP in the last 8760 hours. HbA1C: Recent Labs    03/17/18 0452  HGBA1C 7.3*   CBG: Recent Labs  Lab 03/18/18 0513  03/18/18 1139 03/18/18 1713 03/18/18 2345 03/19/18 0703  GLUCAP 141* 174* 106* 84 109*   Lipid Profile: No results for input(s): CHOL, HDL, LDLCALC, TRIG, CHOLHDL, LDLDIRECT in the last 72 hours. Thyroid Function Tests: No results for input(s): TSH, T4TOTAL, FREET4, T3FREE, THYROIDAB in the last 72 hours. Anemia Panel: No results for input(s): VITAMINB12, FOLATE, FERRITIN, TIBC, IRON, RETICCTPCT in the last 72 hours. Urine analysis:    Component Value Date/Time   COLORURINE YELLOW 03/15/2018 1250   APPEARANCEUR HAZY (A) 03/15/2018 1250   LABSPEC 1.020 03/15/2018 1250   PHURINE 5.0 03/15/2018 1250   GLUCOSEU NEGATIVE 03/15/2018 1250   HGBUR NEGATIVE 03/15/2018 1250   BILIRUBINUR NEGATIVE 03/15/2018 1250   KETONESUR NEGATIVE 03/15/2018 1250   PROTEINUR NEGATIVE 03/15/2018 1250   UROBILINOGEN 0.2 02/20/2015 1630   NITRITE NEGATIVE 03/15/2018 1250   LEUKOCYTESUR NEGATIVE 03/15/2018 1250   Sepsis Labs: @LABRCNTIP (procalcitonin:4,lacticidven:4)  ) Recent Results (from the past 240 hour(s))  Wet prep, genital     Status: Abnormal   Collection Time: 03/15/18  6:44 PM  Result Value Ref Range Status   Yeast Wet Prep HPF POC NONE SEEN NONE SEEN Final   Trich, Wet Prep NONE SEEN NONE SEEN Final   Clue Cells Wet Prep HPF POC NONE SEEN NONE SEEN Final   WBC, Wet Prep HPF POC MANY (A) NONE SEEN Final   Sperm NONE SEEN  Final    Comment: Performed at [facility], [address] [place], [place] [address]      Studies: No results found.  Scheduled Meds: . enoxaparin (LOVENOX) injection  60 mg Subcutaneous Daily  .  morphine injection  2 mg Intravenous Once    Continuous Infusions: . piperacillin-tazobactam (ZOSYN)  IV 3.375 g (03/19/18 0609)     LOS: 4 days     Rhetta MuraJai-Gurmukh Vayden Weinand, MD [facility]  If 7PM-7AM, please contact night-coverage [email or web address] Password [email or web address] 03/19/2018, 10:04 AM

## 2018-03-20 DIAGNOSIS — K5792 Diverticulitis of intestine, part unspecified, without perforation or abscess without bleeding: Secondary | ICD-10-CM

## 2018-03-20 LAB — CBC
HEMATOCRIT: 33.6 % — AB (ref 36.0–46.0)
Hemoglobin: 11.2 g/dL — ABNORMAL LOW (ref 12.0–15.0)
MCH: 28.9 pg (ref 26.0–34.0)
MCHC: 33.3 g/dL (ref 30.0–36.0)
MCV: 86.6 fL (ref 78.0–100.0)
PLATELETS: 484 10*3/uL — AB (ref 150–400)
RBC: 3.88 MIL/uL (ref 3.87–5.11)
RDW: 12.9 % (ref 11.5–15.5)
WBC: 11.5 10*3/uL — ABNORMAL HIGH (ref 4.0–10.5)

## 2018-03-20 LAB — GLUCOSE, CAPILLARY
GLUCOSE-CAPILLARY: 109 mg/dL — AB (ref 70–99)
GLUCOSE-CAPILLARY: 113 mg/dL — AB (ref 70–99)

## 2018-03-20 MED ORDER — AMOXICILLIN-POT CLAVULANATE 875-125 MG PO TABS: 1.0000 | ORAL_TABLET | Freq: Two times a day (BID) | ORAL | 1 refills | Status: AC

## 2018-03-20 NOTE — Progress Notes (Signed)
Discharge instructions discussed with patient and family, verbalized agreement and understanding.  Instructed of follow up appointments, and to complete antibiotics

## 2018-03-20 NOTE — Progress Notes (Signed)
   Subjective/Chief Complaint: No complaints. Wants to go home   Objective: Vital signs in last 24 hours: Temp:  [98.4 F (36.9 C)-98.9 F (37.2 C)] 98.6 F (37 C) (07/20 0455) Pulse Rate:  [76-80] 80 (07/20 0455) Resp:  [16-18] 16 (07/20 0455) BP: (140-148)/(92-98) 140/92 (07/20 0455) SpO2:  [99 %-100 %] 99 % (07/20 0455) Weight:  [125.7 kg (277 lb 1.9 oz)] 125.7 kg (277 lb 1.9 oz) (07/20 0500) Last BM Date: 03/18/18  Intake/Output from previous day: 07/19 0701 - 07/20 0700 In: 1292.5 [P.O.:1200; IV Piggyback:92.5] Out: 3 [Urine:3] Intake/Output this shift: No intake/output data recorded.  General appearance: alert and cooperative Resp: clear to auscultation bilaterally Cardio: regular rate and rhythm GI: soft, non-tender; bowel sounds normal; no masses,  no organomegaly  Lab Results:  Recent Labs    03/19/18 0501 03/20/18 0858  WBC 11.9* 11.5*  HGB 10.2* 11.2*  HCT 31.4* 33.6*  PLT 439* 484*   BMET Recent Labs    03/18/18 0509  NA 138  K 3.5  CL 104  CO2 25  GLUCOSE 150*  BUN <5*  CREATININE 0.51  CALCIUM 8.1*   PT/INR No results for input(s): LABPROT, INR in the last 72 hours. ABG No results for input(s): PHART, HCO3 in the last 72 hours.  Invalid input(s): PCO2, PO2  Studies/Results: No results found.  Anti-infectives: Anti-infectives (From admission, onward)   Start     Dose/Rate Route Frequency Ordered Stop   03/16/18 0600  piperacillin-tazobactam (ZOSYN) IVPB 3.375 g     3.375 g 12.5 mL/hr over 240 Minutes Intravenous Every 8 hours 03/15/18 2121     03/15/18 1945  piperacillin-tazobactam (ZOSYN) IVPB 3.375 g     3.375 g 100 mL/hr over 30 Minutes Intravenous  Once 03/15/18 1941 03/15/18 2101      Assessment/Plan: s/p * No surgery found * Advance diet Discharge  LOS: 5 days    TOTH III,PAUL S 03/20/2018

## 2018-03-20 NOTE — Discharge Summary (Signed)
Physician Discharge Summary  Dorothy Haynes WUJ:[id number] DOB: 01/13/1986 DOA: 03/15/2018  PCP: Leland HerYoo, Elsia J, DO  Admit date: 03/15/2018 Discharge date: 03/20/2018  Time spent: 35 minutes  Recommendations for Outpatient Follow-up:  1. Recommend initiation of sulfonylurea for diabetes-patient has failed biguanide/metformin because of side effects and also will need counseling regarding physician guided weight loss/possible bariatric referral 2. Recommend completion of Augmentin for abdominal abscess-needs screening colonoscopy in about 6 to 8 weeks to be coordinated by PCP-also will require follow-up with surgeon in the outpatient setting 3. Recommend CBC plus differential in about 1 week 4. Recommend soft diet for the time being and then graduation of diet going forward 5. Recommend sustained counseling for tobacco-as an outpatient also recommend stop bang or Epworth 6. CC her primary physician  Discharge Diagnoses:  Principal Problem:   Acute diverticulitis of intestine Active Problems:   Morbid obesity (HCC)   Essential hypertension   Discharge Condition: Improved  Diet recommendation: Diabetic heart healthy  Filed Weights   03/17/18 0631 03/18/18 0511 03/20/18 0500  Weight: 121.1 kg (267 lb) 121.1 kg (266 lb 14.4 oz) 125.7 kg (277 lb 1.9 oz)    History of present illness:  [age] fem with history of hypertension, super-obesity Body mass index is 49.09 kg/m. , presents to the ER complaining of left lower quadrant abdominal pain for the past few days.  Patient denied any fever/chills, diarrhea, nausea/vomiting, chest pain.  In the ED, CT abdomen/pelvis showed sigmoid diverticulitis with microperforation.  General surgery consulted.  Patient admitted for further management.     Hospital Course:  Acute sigmoid diverticulitis with microperforation Currently afebrile-Leuk improving daily-labs am CT abdomen showed acute mid sigmoid diverticulitis complicated by tiny  contained microperforations with extraluminal gas seen. No abscess or bowel obstruction Patient completed in hospital Zosyn which was transitioned to Augmentin on discharge and will complete a total of 14 days therapy antibiotics going forward Outpatient screening colonoscopy, general surgery input needed on discharge  Hypertension Controlled  HCTz was on hold--resume 7/19 and blood pressure stabilized in the 140 range -Might need initiation of another agent to control her blood pressure-defer to PCP  Prediabetes Last A1c 12/2016-->6.2 currently 7.3 Accu-Cheks controlled in the hospital 84-109-stable no changes Tried metformin in the past-have discussed briefly with her alternative modalities such as sulfonylureas-PCP has an appointment scheduled 7/22 and this can be discussed at that time  Normocytic anemia ??Dilutional on IV fluids Daily CBC  Smoker counselled--pre-contemplative-offered patch  OHSS habitus Snores-suggest OP stop-bang or Epworth    Procedures:  Multiple   Consultations:  Surgery  Discharge Exam: Vitals:   03/19/18 2140 03/20/18 0455  BP: (!) 148/98 (!) 140/92  Pulse: 78 80  Resp: 18 16  Temp: 98.9 F (37.2 C) 98.6 F (37 C)  SpO2: 100% 99%    General: Obese pleasant no distress Mallampati 4 no pain EOMI NCAT no icterus Cardiovascular: S1-S2 no murmur rub or gallop Respiratory: Likely clear no added sound Abdomen soft cannot appreciate organomegaly however no tenderness No lower extremity edema  Discharge Instructions   Discharge Instructions    Call MD for:  difficulty breathing, headache or visual disturbances   Complete by:  As directed    Call MD for:  extreme fatigue   Complete by:  As directed    Call MD for:  hives   Complete by:  As directed    Call MD for:  persistant dizziness or light-headedness   Complete by:  As directed  Call MD for:  persistant nausea and vomiting   Complete by:  As directed    Call MD for:   redness, tenderness, or signs of infection (pain, swelling, redness, odor or green/yellow discharge around incision site)   Complete by:  As directed    Call MD for:  severe uncontrolled pain   Complete by:  As directed    Call MD for:  temperature >100.4   Complete by:  As directed    Diet - low sodium heart healthy   Complete by:  As directed    Diet - low sodium heart healthy   Complete by:  As directed    Discharge instructions   Complete by:  As directed    May shower. Low residue diet and avoid seeds and nuts. Activity as tolerated   Discharge instructions   Complete by:  As directed    Complete all antibiotics Meet Dr. Artist Pais and please consider a vegetable based diet and new meds [Amaryl maybe?] for your diabetes Need labs 1week   Increase activity slowly   Complete by:  As directed    Increase activity slowly   Complete by:  As directed    No wound care   Complete by:  As directed      Allergies as of 03/20/2018   No Known Allergies     Medication List    TAKE these medications   albuterol 108 (90 Base) MCG/ACT inhaler Commonly known as:  PROVENTIL HFA;VENTOLIN HFA Inhale 1-2 puffs into the lungs every 6 (six) hours as needed for wheezing or shortness of breath.   amoxicillin-clavulanate 875-125 MG tablet Commonly known as:  AUGMENTIN Take 1 tablet by mouth 2 (two) times daily for 10 days.   hydrochlorothiazide 12.5 MG tablet Commonly known as:  HYDRODIURIL TAKE 1 TABLET BY MOUTH EVERY DAY   hydrOXYzine 25 MG tablet Commonly known as:  ATARAX/VISTARIL Take 1 tablet (25 mg total) by mouth every 6 (six) hours.      No Known Allergies Follow-up Information    Andria Meuse, MD Follow up in 2 week(s).   Specialty:  General Surgery Contact information: [address] [place] [address] [phone]            The results of significant diagnostics from this hospitalization (including imaging, microbiology, ancillary and laboratory) are  listed below for reference.    Significant Diagnostic Studies: Ct Abdomen Pelvis W Contrast  Result Date: 03/15/2018 CLINICAL DATA:  Lower abdominal cramping. EXAM: CT ABDOMEN AND PELVIS WITH CONTRAST TECHNIQUE: Multidetector CT imaging of the abdomen and pelvis was performed using the standard protocol following bolus administration of intravenous contrast. CONTRAST:  ISOVUE-300 IOPAMIDOL (ISOVUE-300) INJECTION 61% COMPARISON:  08/15/2016 FINDINGS: Lower chest: Normal size heart without pericardial effusion. Clear lung bases with minimal dependent atelectasis. No pulmonary consolidation, mass, effusion or pneumothorax. Hepatobiliary: Homogeneous enhancement of the liver without space-occupying mass. No biliary dilatation. Status post cholecystectomy. Pancreas: Normal Spleen: Normal Adrenals/Urinary Tract: Adrenal glands are unremarkable. Kidneys are normal, without renal calculi, focal lesion, or hydronephrosis. Bladder is unremarkable. Stomach/Bowel: Pericolonic inflammation along the mid sigmoid colon consistent with acute diverticulitis complicated by tiny microperforations characterized by small foci of adjacent contain mesenteric gas. No pericolonic abscess. No bowel obstruction. The remainder of the gastrointestinal system is unremarkable. Normal appearing appendix is visualized. Vascular/Lymphatic: No significant vascular findings are present. No enlarged abdominal or pelvic lymph nodes. Reproductive: The uterus is unremarkable. Bilateral physiologic sized follicles are seen within the ovaries.  Other: No free fluid. Musculoskeletal: No acute or significant osseous findings. IMPRESSION: Acute mid sigmoid diverticulitis complicated by tiny contained microperforations with extraluminal gas seen. No abscess or bowel obstruction. These results were called by telephone at the time of interpretation on 03/15/2018 at 7:32 pm to PA [facility] , who verbally acknowledged these results. Electronically  Signed   By: Tollie Eth M.D.   On: 03/15/2018 19:32    Microbiology: Recent Results (from the past 240 hour(s))  Wet prep, genital     Status: Abnormal   Collection Time: 03/15/18  6:44 PM  Result Value Ref Range Status   Yeast Wet Prep HPF POC NONE SEEN NONE SEEN Final   Trich, Wet Prep NONE SEEN NONE SEEN Final   Clue Cells Wet Prep HPF POC NONE SEEN NONE SEEN Final   WBC, Wet Prep HPF POC MANY (A) NONE SEEN Final   Sperm NONE SEEN  Final    Comment: Performed at [facility], [address], [place], [place] [address]     Labs: Basic Metabolic Panel: Recent Labs  Lab 03/15/18 1250 03/16/18 0515 03/17/18 0452 03/18/18 0509  NA 138 138 138 138  K 3.8 3.7 3.6 3.5  CL 100 102 102 104  CO2 27 28 28 25   GLUCOSE 158* 173* 156* 150*  BUN 10 7 5* <5*  CREATININE 0.61 0.58 0.57 0.51  CALCIUM 9.0 8.2* 8.1* 8.1*   Liver Function Tests: Recent Labs  Lab 03/15/18 1250 03/16/18 0515  AST 15 21  ALT 18 20  ALKPHOS 72 60  BILITOT 0.6 0.4  PROT 7.7 6.6  ALBUMIN 3.7 3.1*   Recent Labs  Lab 03/15/18 1250  LIPASE 39   No results for input(s): AMMONIA in the last 168 hours. CBC: Recent Labs  Lab 03/16/18 0515 03/17/18 0452 03/18/18 0509 03/19/18 0501 03/20/18 0858  WBC 14.6* 15.3* 13.1* 11.9* 11.5*  NEUTROABS 10.7*  --   --   --   --   HGB 10.5* 10.6* 10.4* 10.2* 11.2*  HCT 31.9* 32.9* 32.2* 31.4* 33.6*  MCV 87.6 88.2 87.3 87.2 86.6  PLT 424* 465* 444* 439* 484*   Cardiac Enzymes: No results for input(s): CKTOTAL, CKMB, CKMBINDEX, TROPONINI in the last 168 hours. BNP: BNP (last 3 results) No results for input(s): BNP in the last 8760 hours.  ProBNP (last 3 results) No results for input(s): PROBNP in the last 8760 hours.  CBG: Recent Labs  Lab 03/18/18 1713 03/18/18 2345 03/19/18 0703 03/19/18 1342 03/19/18 1754  GLUCAP 106* 84 109* 162* 93       Signed:  Rhetta Mura MD   [facility] 03/20/2018, 10:55 AM

## 2018-03-22 ENCOUNTER — Other Ambulatory Visit: Payer: Self-pay

## 2018-03-22 ENCOUNTER — Encounter: Payer: Self-pay | Admitting: Family Medicine

## 2018-03-22 ENCOUNTER — Ambulatory Visit (INDEPENDENT_AMBULATORY_CARE_PROVIDER_SITE_OTHER): Payer: Medicaid Other | Admitting: Family Medicine

## 2018-03-22 VITALS — BP 130/94 | HR 93 | Temp 98.3°F | Wt 270.0 lb

## 2018-03-22 DIAGNOSIS — K5732 Diverticulitis of large intestine without perforation or abscess without bleeding: Secondary | ICD-10-CM

## 2018-03-22 DIAGNOSIS — Z1211 Encounter for screening for malignant neoplasm of colon: Secondary | ICD-10-CM

## 2018-03-22 DIAGNOSIS — G4733 Obstructive sleep apnea (adult) (pediatric): Secondary | ICD-10-CM | POA: Insufficient documentation

## 2018-03-22 DIAGNOSIS — R0683 Snoring: Secondary | ICD-10-CM | POA: Insufficient documentation

## 2018-03-22 DIAGNOSIS — K5792 Diverticulitis of intestine, part unspecified, without perforation or abscess without bleeding: Secondary | ICD-10-CM

## 2018-03-22 DIAGNOSIS — E119 Type 2 diabetes mellitus without complications: Secondary | ICD-10-CM | POA: Diagnosis not present

## 2018-03-22 MED ORDER — METFORMIN HCL 500 MG PO TABS: 500.0000 mg | ORAL_TABLET | Freq: Every day | ORAL | 2 refills | Status: DC

## 2018-03-22 NOTE — Progress Notes (Signed)
Subjective:  Dorothy Haynes is a 32 y.o. female who presents to the [facility] today for hospital follow up.  HPI:   Admitted to [facility] from 7/15-7/20/2019 for abdominal pain was found to have acute sigmoid diverticulitis with microperforation.  She currently on a 14-day course of antibiotics initially with IV Zosyn and was transitioned to Augmentin on discharge.  She was seen by general surgery was told to follow-up in 2 weeks.  She has a number for Dr. Lucilla Lame office and is planning to call them today.  She is also told that she needed an outpatient screening colonoscopy in 6 to 8 weeks.  Since her hospital stay she has been able to advance her diet. She is back to eating a regular diet but has some early satiety.  Is avoiding foods with seeds or nuts. She says her abdominal pain is now minimal.  No vomiting, fever, chills, melena, hematochezia. She has been able to stop smoking.  She denies any cravings.  During her hospital stay she was found to have diabetes.  She had previously had prediabetes but her A1c increased to 7.3.  Patient states that she only briefly tried metformin in the past and is unsure if she is able to tolerate the medication.  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  She is willing to retrial. Her goal is to get off all diabetes medications  She was also told that she needed a sleep study while she is admitted because of her excessive snoring.  Patient says that she has had a long-standing history of intermittent daytime sleepiness and morning headaches.  She says that her family has told her that she snores very loudly.  But has never been told that she gasps for breath in the middle the night.  ROS: Per HPI  Objective:  Physical Exam: BP (!) 130/94   Pulse 93   Temp 98.3 F (36.8 C) (Oral)   Wt 270 lb (122.5 kg)   LMP 03/01/2018 (Approximate) Comment: neg preg test 03/15/2018  SpO2 97%   BMI 47.83 kg/m   Gen:  NAD, resting comfortably CV: RRR with no murmurs appreciated Pulm: NWOB, CTAB with no crackles, wheezes, or rhonchi GI: Normal bowel sounds present. Soft, Nontender, Nondistended. MSK: no edema, cyanosis, or clubbing noted Skin: warm, dry Neuro: grossly normal, moves all extremities Psych: Normal affect and thought content  Results for orders placed or performed during the hospital encounter of 03/15/18 (from the past 72 hour(s))  Glucose, capillary     Status: None   Collection Time: 03/19/18  5:54 PM  Result Value Ref Range   Glucose-Capillary 93 70 - 99 mg/dL  Glucose, capillary     Status: Abnormal   Collection Time: 03/20/18  1:15 AM  Result Value Ref Range   Glucose-Capillary 109 (H) 70 - 99 mg/dL  Glucose, capillary     Status: Abnormal   Collection Time: 03/20/18  7:36 AM  Result Value Ref Range   Glucose-Capillary 113 (H) 70 - 99 mg/dL  CBC     Status: Abnormal   Collection Time: 03/20/18  8:58 AM  Result Value Ref Range   WBC 11.5 (H) 4.0 - 10.5 K/uL   RBC 3.88 3.87 - 5.11 MIL/uL   Hemoglobin 11.2 (L) 12.0 - 15.0 g/dL   HCT 16.1 (L) 09.6 - 04.5 %   MCV 86.6 78.0 - 100.0 fL   MCH 28.9 26.0 - 34.0 pg   MCHC 33.3  30.0 - 36.0 g/dL   RDW 40.912.9 81.111.5 - 91.415.5 %   Platelets 484 (H) 150 - 400 K/uL    Comment: Performed at [facility], [address] [place], [place] [id number]     Assessment/Plan:  Acute diverticulitis of intestine Stable and improved.  She is currently still taking a 14-day course of antibiotics and is compliant on her Augmentin.  Discussed that she should continue her antibiotics until completes the full course of therapy. -Patient to schedule general surgery follow-up -Referral to GI placed today for screening colonoscopy that needs to be done in 6 to 8 weeks -CBC with differential today as per discharge summary recommendations given her anemia  Type 2 diabetes mellitus without complication, without long-term current use of insulin  (HCC) Start metformin 500 mg daily.   Discussed healthy diet changes as well as exercise. Will discuss DM eye exam and PSV23 at next visit in 1 month  Loud snoring Given patient body habitus, daytime sleepiness, morning headaches a referral to sleep medicine was placed today.   Leland HerElsia J Abby Tucholski, DO PGY-3, [facility] 03/22/2018 1:57 PM

## 2018-03-22 NOTE — Patient Instructions (Addendum)
Start metformin 500mg  daily.  2 referral placed today  - Gastroenterology for a colonoscopy - Sleep medicine to evaluate need for sleep study  Call general surgery Dr. Cliffton AstersWhite for a follow up.  We are checking some labs today. If results require attention, either myself or my nurse will get in touch with you. If everything is normal, you will get a letter in the mail or a message in My Chart. Please give us a call if you do not hear from us after 2 weeks.    Diet Recommendations for Diabetes  Carbohydrate includes starch, sugar, and fiber.  Of these, only sugar and starch raise blood glucose.  (Fiber is found in fruits, vegetables [especially skin, seeds, and stalks] and whole grains.)   Starchy (carb) foods: Bread, rice, pasta, potatoes, corn, cereal, grits, crackers, bagels, muffins, all baked goods.  (Fruit, milk, and yogurt also have carbohydrate, but most of these foods will not spike your blood sugar as most starchy foods will.)  A few fruits do cause high blood sugars; use small portions of bananas (limit to 1/2 at a time), grapes, watermelon, oranges, and most tropical fruits.   Protein foods: Meat, fish, poultry, eggs, dairy foods, and beans such as pinto and kidney beans (beans also provide carbohydrate).   1. Eat at least REAL 3 meals and 1-2 snacks per day. Never go more than 4-5 hours while awake without eating. Eat breakfast within the first hour of getting up.   2. Limit starchy foods to TWO per meal and ONE per snack. ONE portion of a starchy  food is equal to the following:   - ONE slice of bread (or its equivalent, such as half of a hamburger bun).   - 1/2 cup of a "scoopable" starchy food such as potatoes or rice.   - 15 grams of Total Carbohydrate as shown on food label.  3. Include at every meal: a protein food, a carb food, and vegetables and/or fruit.   - Obtain twice the volume of veg's as protein or carbohydrate foods for both lunch and dinner.   - Fresh or frozen veg's  are best.   - Keep frozen veg's on hand for a quick vegetable serving.        Obtain twice the volume of vegetables as either protein or starchy foods for both lunch and dinner.   You may also read about the DASH diet at: [employer]cywww.[facility].org/healthy-lifestyle/nutrition-and-healthy-eating/in-depth/dash-diet

## 2018-03-22 NOTE — Assessment & Plan Note (Signed)
Given patient body habitus, daytime sleepiness, morning headaches a referral to sleep medicine was placed today.

## 2018-03-22 NOTE — Assessment & Plan Note (Signed)
Start metformin 500 mg daily.   Discussed healthy diet changes as well as exercise. Will discuss DM eye exam and PSV23 at next visit in 1 month

## 2018-03-22 NOTE — Assessment & Plan Note (Signed)
Stable and improved.  She is currently still taking a 14-day course of antibiotics and is compliant on her Augmentin.  Discussed that she should continue her antibiotics until completes the full course of therapy. -Patient to schedule general surgery follow-up -Referral to GI placed today for screening colonoscopy that needs to be done in 6 to 8 weeks -CBC with differential today as per discharge summary recommendations given her anemia

## 2018-03-23 LAB — CBC WITH DIFFERENTIAL/PLATELET
BASOS ABS: 0 10*3/uL (ref 0.0–0.2)
Basos: 0 %
EOS (ABSOLUTE): 0.3 10*3/uL (ref 0.0–0.4)
EOS: 2 %
Hematocrit: 34.8 % (ref 34.0–46.6)
Hemoglobin: 11.5 g/dL (ref 11.1–15.9)
IMMATURE GRANULOCYTES: 0 %
Immature Grans (Abs): 0 10*3/uL (ref 0.0–0.1)
Lymphocytes Absolute: 4.9 10*3/uL — ABNORMAL HIGH (ref 0.7–3.1)
Lymphs: 35 %
MCH: 28.8 pg (ref 26.6–33.0)
MCHC: 33 g/dL (ref 31.5–35.7)
MCV: 87 fL (ref 79–97)
MONOS ABS: 0.7 10*3/uL (ref 0.1–0.9)
Monocytes: 5 %
NEUTROS PCT: 58 %
Neutrophils Absolute: 7.9 10*3/uL — ABNORMAL HIGH (ref 1.4–7.0)
PLATELETS: 490 10*3/uL — AB (ref 150–450)
RBC: 4 x10E6/uL (ref 3.77–5.28)
RDW: 14 % (ref 12.3–15.4)
WBC: 13.8 10*3/uL — AB (ref 3.4–10.8)

## 2018-04-06 ENCOUNTER — Other Ambulatory Visit: Payer: Self-pay | Admitting: Family Medicine

## 2018-04-06 DIAGNOSIS — I10 Essential (primary) hypertension: Secondary | ICD-10-CM

## 2018-04-07 DIAGNOSIS — K5792 Diverticulitis of intestine, part unspecified, without perforation or abscess without bleeding: Secondary | ICD-10-CM | POA: Diagnosis not present

## 2018-04-14 ENCOUNTER — Other Ambulatory Visit: Payer: Self-pay | Admitting: Family Medicine

## 2018-04-14 DIAGNOSIS — E119 Type 2 diabetes mellitus without complications: Secondary | ICD-10-CM

## 2018-04-23 ENCOUNTER — Emergency Department (HOSPITAL_COMMUNITY): Payer: Medicaid Other

## 2018-04-23 ENCOUNTER — Encounter (HOSPITAL_COMMUNITY): Payer: Self-pay | Admitting: *Deleted

## 2018-04-23 ENCOUNTER — Emergency Department (HOSPITAL_COMMUNITY)
Admission: EM | Admit: 2018-04-23 | Discharge: 2018-04-24 | Disposition: A | Discharge status: Home / Self Care | Payer: Medicaid Other | Attending: Emergency Medicine | Admitting: Emergency Medicine

## 2018-04-23 DIAGNOSIS — R1032 Left lower quadrant pain: Secondary | ICD-10-CM | POA: Diagnosis present

## 2018-04-23 DIAGNOSIS — Z7984 Long term (current) use of oral hypoglycemic drugs: Secondary | ICD-10-CM | POA: Insufficient documentation

## 2018-04-23 DIAGNOSIS — K5732 Diverticulitis of large intestine without perforation or abscess without bleeding: Secondary | ICD-10-CM

## 2018-04-23 DIAGNOSIS — F17210 Nicotine dependence, cigarettes, uncomplicated: Secondary | ICD-10-CM | POA: Diagnosis not present

## 2018-04-23 DIAGNOSIS — R1031 Right lower quadrant pain: Secondary | ICD-10-CM | POA: Insufficient documentation

## 2018-04-23 DIAGNOSIS — R197 Diarrhea, unspecified: Secondary | ICD-10-CM | POA: Diagnosis not present

## 2018-04-23 DIAGNOSIS — R109 Unspecified abdominal pain: Secondary | ICD-10-CM | POA: Diagnosis not present

## 2018-04-23 DIAGNOSIS — R1013 Epigastric pain: Secondary | ICD-10-CM | POA: Insufficient documentation

## 2018-04-23 DIAGNOSIS — E119 Type 2 diabetes mellitus without complications: Secondary | ICD-10-CM | POA: Diagnosis not present

## 2018-04-23 LAB — COMPREHENSIVE METABOLIC PANEL
ALT: 14 U/L (ref 0–44)
AST: 15 U/L (ref 15–41)
Albumin: 3.8 g/dL (ref 3.5–5.0)
Alkaline Phosphatase: 54 U/L (ref 38–126)
Anion gap: 13 (ref 5–15)
BUN: 13 mg/dL (ref 6–20)
CHLORIDE: 101 mmol/L (ref 98–111)
CO2: 26 mmol/L (ref 22–32)
CREATININE: 0.69 mg/dL (ref 0.44–1.00)
Calcium: 9 mg/dL (ref 8.9–10.3)
GFR calc Af Amer: >60 mL/min (ref >60)
GFR calc non Af Amer: >60 mL/min (ref >60)
Glucose, Bld: 151 mg/dL — ABNORMAL HIGH (ref 70–99)
Potassium: 3.1 mmol/L — ABNORMAL LOW (ref 3.5–5.1)
SODIUM: 140 mmol/L (ref 135–145)
Total Bilirubin: 0.4 mg/dL (ref 0.3–1.2)
Total Protein: 7.4 g/dL (ref 6.5–8.1)

## 2018-04-23 LAB — CBC
HCT: 34.8 % — ABNORMAL LOW (ref 36.0–46.0)
Hemoglobin: 11.6 g/dL — ABNORMAL LOW (ref 12.0–15.0)
MCH: 28.9 pg (ref 26.0–34.0)
MCHC: 33.3 g/dL (ref 30.0–36.0)
MCV: 86.6 fL (ref 78.0–100.0)
PLATELETS: 454 10*3/uL — AB (ref 150–400)
RBC: 4.02 MIL/uL (ref 3.87–5.11)
RDW: 13.2 % (ref 11.5–15.5)
WBC: 15.2 10*3/uL — ABNORMAL HIGH (ref 4.0–10.5)

## 2018-04-23 LAB — URINALYSIS, ROUTINE W REFLEX MICROSCOPIC
BILIRUBIN URINE: NEGATIVE
Bacteria, UA: NONE SEEN
Glucose, UA: NEGATIVE mg/dL
Ketones, ur: NEGATIVE mg/dL
Leukocytes, UA: NEGATIVE
Nitrite: NEGATIVE
Protein, ur: 30 mg/dL — AB
SPECIFIC GRAVITY, URINE: 1.023 (ref 1.005–1.030)
pH: 5 (ref 5.0–8.0)

## 2018-04-23 LAB — LIPASE, BLOOD: LIPASE: 29 U/L (ref 11–51)

## 2018-04-23 LAB — I-STAT BETA HCG BLOOD, ED (MC, WL, AP ONLY): I-stat hCG, quantitative: <5 m[IU]/mL (ref <5)

## 2018-04-23 MED ORDER — METRONIDAZOLE 500 MG PO TABS
500.0000 mg | ORAL_TABLET | Freq: Once | ORAL | Status: AC
  Administered 2018-04-24: 500 mg via ORAL
  Filled 2018-04-23: qty 1

## 2018-04-23 MED ORDER — IOPAMIDOL (ISOVUE-300) INJECTION 61%
100.0000 mL | Freq: Once | INTRAVENOUS | Status: AC | PRN
  Administered 2018-04-23: 100 mL via INTRAVENOUS

## 2018-04-23 MED ORDER — IOPAMIDOL (ISOVUE-300) INJECTION 61%
INTRAVENOUS | Status: AC
  Filled 2018-04-23: qty 100

## 2018-04-23 MED ORDER — CIPROFLOXACIN HCL 500 MG PO TABS
500.0000 mg | ORAL_TABLET | Freq: Once | ORAL | Status: AC
  Administered 2018-04-24: 500 mg via ORAL
  Filled 2018-04-23: qty 1

## 2018-04-23 MED ORDER — METRONIDAZOLE 500 MG PO TABS: 500.0000 mg | ORAL_TABLET | Freq: Three times a day (TID) | ORAL | 0 refills | Status: AC

## 2018-04-23 MED ORDER — CIPROFLOXACIN HCL 500 MG PO TABS: 500.0000 mg | ORAL_TABLET | Freq: Two times a day (BID) | ORAL | 0 refills | Status: AC

## 2018-04-23 NOTE — ED Triage Notes (Signed)
Pt complains of abdominal pain, back pain, nausea, diarrhea. Pt recently had diverticulitis.

## 2018-04-23 NOTE — ED Provider Notes (Signed)
[facility]-EMERGENCY DEPT Provider Note   CSN: [id number] Arrival date & time: 04/23/18  1626     History   Chief Complaint Chief Complaint  Patient presents with  . Abdominal Pain  . Diarrhea    HPI Dorothy Haynes is a 32 y.o. female with history of diverticulitis complicated by microperforation, GERD, cholecystectomy is here for evaluation of abdominal pain.  Onset yesterday.  Pain is to the left lower quadrant, epigastrium and right flank but states pain is "all over".  Pain in the left lower quadrant is sharp, intermittent, similar to previous bouts of diverticulitis, 6/10.  Describes the pain to her right flank and low back as "heavy weight sitting on top of me".  Pain in epigastrium and lower abdomen is "crampy". Associated symptoms include nausea and diarrhea.  She is currently on her menstrual cycle and does not know if what she sees in the toilet/BM or urine is from her period or from her stool.  Had chills last night but no frank fever.  Diffuse lower abdomen cramping that she attributes to her menstrual cycle.  Chart review shows patient was admitted at [facility] from 7/15 to 03/20/2018 after being diagnosed with sigmoid diverticulitis with microperforation.  She finished antibiotic course and has followed up with Dr. Cliffton Asters with general surgery who is recommending some type of surgery to be scheduled.  She has not scheduled the surgery because she is waiting on Medicaid.  She has an upcoming appointment in 4 days for consultation for colonoscopy.  No associated fevers, dysuria, changes to vaginal discharge.   HPI  Past Medical History:  Diagnosis Date  . GERD (gastroesophageal reflux disease) 2009  . Gestational diabetes 2008  . Headache   . History of gallstones 10/2015   s/p cholecystectomy 10/09/15  . Hypertension   . Morbid obesity (HCC) 10/09/2015  . Shortness of breath dyspnea   . Tobacco abuse 10/09/2015    Patient Active Problem List   Diagnosis Date Noted  . Type 2 diabetes mellitus without complication, without long-term current use of insulin (HCC) 03/22/2018  . Loud snoring 03/22/2018  . Acute diverticulitis of intestine 03/15/2018  . Essential hypertension 02/08/2018  . Missed periods 10/23/2016  . Anxiety, mild 09/28/2016  . Morbid obesity (HCC) 10/09/2015  . Tobacco abuse 10/09/2015    Past Surgical History:  Procedure Laterality Date  . CHOLECYSTECTOMY N/A 10/09/2015   Procedure: LAPAROSCOPIC CHOLECYSTECTOMY ;  Surgeon: Claud Kelp, MD;  Location: [facility]S;  Service: General;  Laterality: N/A;  . WISDOM TOOTH EXTRACTION       OB History   None      Home Medications    Prior to Admission medications   Medication Sig Start Date End Date Taking? Authorizing Provider  albuterol (PROVENTIL HFA;VENTOLIN HFA) 108 (90 Base) MCG/ACT inhaler Inhale 1-2 puffs into the lungs every 6 (six) hours as needed for wheezing or shortness of breath. 08/15/16   Law, Waylan Boga, PA-C  ciprofloxacin (CIPRO) 500 MG tablet Take 1 tablet (500 mg total) by mouth 2 (two) times daily for 7 days. 04/23/18 04/30/18  Liberty Handy, PA-C  hydrochlorothiazide (HYDRODIURIL) 12.5 MG tablet Take 1 tablet (12.5 mg total) by mouth daily. 04/06/18   Leland Her, DO  metFORMIN (GLUCOPHAGE) 500 MG tablet TAKE 1 TABLET BY MOUTH EVERY DAY WITH BREAKFAST 04/14/18   Leland Her, DO  metroNIDAZOLE (FLAGYL) 500 MG tablet Take 1 tablet (500 mg total) by mouth 3 (three) times daily for  7 days. 04/23/18 04/30/18  Liberty Handy, PA-C    Family History Family History  Problem Relation Age of Onset  . Diabetes Mother   . Hypertension Mother   . Throat cancer Father   . Kidney Stones Father   . Alzheimer's disease Other   . Diabetes Other   . Hypertension Other   . Heart disease Other   . Heart disease Other   . Diabetes Other   . Hypertension Other   . Thyroid disease Other   . Diabetes Maternal Grandmother   . Diabetes Maternal  Grandfather     Social History Social History   Tobacco Use  . Smoking status: Current Every Day Smoker    Packs/day: 0.20    Years: 17.00    Pack years: 3.40    Types: Cigarettes  . Smokeless tobacco: Never Used  Substance Use Topics  . Alcohol use: Yes    Comment: occ, 1-2 drink per week  . Drug use: No    Comment: previously used MJ, has been off recreational drugs     Allergies   Patient has no known allergies.   Review of Systems Review of Systems  All other systems reviewed and are negative.    Physical Exam Updated Vital Signs BP 129/86   Pulse 86   Temp 98.2 F (36.8 C) (Oral)   Resp 20   LMP 04/23/2018   SpO2 100%   Physical Exam  Constitutional: She is oriented to person, place, and time. She appears well-developed and well-nourished. No distress.  NAD.  HENT:  Head: Normocephalic and atraumatic.  Right Ear: External ear normal.  Left Ear: External ear normal.  Nose: Nose normal.  Eyes: Conjunctivae and EOM are normal. No scleral icterus.  Neck: Normal range of motion. Neck supple.  Cardiovascular: Normal rate, regular rhythm and normal heart sounds.  Pulmonary/Chest: Effort normal and breath sounds normal.  Abdominal: Soft. Normal appearance and bowel sounds are normal. There is tenderness in the right lower quadrant, suprapubic area and left lower quadrant.  No guarding, rigidity, rebound.  No CVA tenderness.  Negative Murphy's.  Negative McBurney's.  Musculoskeletal: Normal range of motion. She exhibits no deformity.  Neurological: She is alert and oriented to person, place, and time.  Skin: Skin is warm and dry. Capillary refill takes less than 2 seconds.  Psychiatric: She has a normal mood and affect. Her behavior is normal. Judgment and thought content normal.  Nursing note and vitals reviewed.    ED Treatments / Results  Labs (all labs ordered are listed, but only abnormal results are displayed) Labs Reviewed  COMPREHENSIVE METABOLIC  PANEL - Abnormal; Notable for the following components:      Result Value   Potassium 3.1 (*)    Glucose, Bld 151 (*)    All other components within normal limits  CBC - Abnormal; Notable for the following components:   WBC 15.2 (*)    Hemoglobin 11.6 (*)    HCT 34.8 (*)    Platelets 454 (*)    All other components within normal limits  URINALYSIS, ROUTINE W REFLEX MICROSCOPIC - Abnormal; Notable for the following components:   APPearance HAZY (*)    Hgb urine dipstick LARGE (*)    Protein, ur 30 (*)    RBC / HPF >50 (*)    All other components within normal limits  LIPASE, BLOOD  I-STAT BETA HCG BLOOD, ED ([facility], [facility], [facility] ONLY)    EKG None  Radiology Ct Abdomen  Pelvis W Contrast  Result Date: 04/23/2018 CLINICAL DATA:  Abdominal pain, back pain, nausea and diarrhea. History of recent diverticulitis. EXAM: CT ABDOMEN AND PELVIS WITH CONTRAST TECHNIQUE: Multidetector CT imaging of the abdomen and pelvis was performed using the standard protocol following bolus administration of intravenous contrast. CONTRAST:  100mL ISOVUE-300 IOPAMIDOL (ISOVUE-300) INJECTION 61% COMPARISON:  03/15/2018 FINDINGS: Lower chest: Normal heart size.  Clear lung bases. Hepatobiliary: No focal liver abnormality is seen. Status post cholecystectomy. No biliary dilatation. Pancreas: Unremarkable. No pancreatic ductal dilatation or surrounding inflammatory changes. Spleen: Normal in size without focal abnormality. Adrenals/Urinary Tract: Adrenal glands are unremarkable. Kidneys are normal, without renal calculi, focal lesion, or hydronephrosis. Bladder is unremarkable. Stomach/Bowel: Small inflamed diverticulum off the mid sigmoid is noted consistent with acute uncomplicated sigmoid diverticulitis. No bowel obstruction or free air. Vascular/Lymphatic: No significant vascular findings are present. No enlarged abdominal or pelvic lymph nodes. Reproductive: Uterus and bilateral adnexa are unremarkable. Probable small corpus  luteal cyst of the left ovary measuring 18 mm with minimal peripheral enhancement. Other: No abdominopelvic ascites. Musculoskeletal: No acute or significant osseous findings. IMPRESSION: A single inflamed sigmoid diverticulum is noted consistent with acute uncomplicated sigmoid diverticulitis. Electronically Signed   By: Tollie Ethavid  Kwon M.D.   On: 04/23/2018 22:47    Procedures Procedures (including critical care time)  Medications Ordered in ED Medications  iopamidol (ISOVUE-300) 61 % injection (has no administration in time range)  ciprofloxacin (CIPRO) tablet 500 mg (has no administration in time range)  metroNIDAZOLE (FLAGYL) tablet 500 mg (has no administration in time range)  iopamidol (ISOVUE-300) 61 % injection 100 mL (100 mLs Intravenous Contrast Given 04/23/18 2204)     Initial Impression / Assessment and Plan / ED Course  I have reviewed the triage vital signs and the nursing notes.  Pertinent labs & imaging results that were available during my care of the patient were reviewed by me and considered in my medical decision making (see chart for details).  Clinical Course as of Apr 23 2317  Fri Apr 23, 2018  2119 WBC(!): 15.2 [CG]  2119 Hemoglobin(!): 11.6 [CG]  2119 Currently on menstrual cycle  Hgb urine dipstick(!): LARGE [CG]  2119 Potassium(!): 3.1 [CG]  2253 IMPRESSION: A single inflamed sigmoid diverticulum is noted consistent with acute uncomplicated sigmoid diverticulitis.  CT ABDOMEN PELVIS W CONTRAST [CG]    Clinical Course User Index [CG] Liberty HandyGibbons, Burnett Spray J, PA-C    Concern for diverticulitis vs appendicitis vs UTI/pyelonephritis. She is high risk given h/o diverticulitis with perforation requiring 5 day hospital admission.  Labs pending.   2150: Work up remarkable for WBC 15.2, stable anemia hgb 11.6, K 3.1 that could be from diarrhea.  Large hgb with >50 RBC in urine in setting of menstrual cycle, no signs of infection.   2315: CT [facility] remarkable for single  inflamed diverticulum c/w diverticulitis without complication. Pt declined anti-emetics and analgesia in ER, her pain is minimal. Repeat abd exam benign with minimal tenderness at LLQ with deep palpation. VSS. Tolerating PO.  Although pt has h/o complicated diverticulitis with perf there is no indication for further emergent interventions or admission.  Feel she is adequate for outpatient abx tx.  She has upcoming appt for colonoscopy consultation and with PCP this week.  She has scheduled outpatient care with GI, general surgery and PCP.  Recommended outpatient abx with patient and she is in agreement.  She is familiar with diverticulitis and appears to be reliable to return to ER if symptoms  worsen. Discussed return precautions.   Final Clinical Impressions(s) / ED Diagnoses   Final diagnoses:  Diverticulitis of sigmoid colon    ED Discharge Orders         Ordered    ciprofloxacin (CIPRO) 500 MG tablet  2 times daily     04/23/18 2303    metroNIDAZOLE (FLAGYL) 500 MG tablet  3 times daily     04/23/18 2303           Jerrell Mylar 04/23/18 2319    Wynetta Fines, MD 04/26/18 1459

## 2018-04-23 NOTE — Discharge Instructions (Signed)
You were seen in the ER for abdominal pain.  CT scan shows diverticulitis without perforation or collection of abscess.  Take ciprofloxacin and metronidazole as prescribed.  Start clear liquid diet for the next 48 hours and slowly transition to a bland diet.  For pain you can take acetaminophen.  Follow-up with your general doctor next week as scheduled.  Return to the ER sooner if there is worsening pain, fevers, vomiting, bloody stools.

## 2018-04-27 DIAGNOSIS — K5720 Diverticulitis of large intestine with perforation and abscess without bleeding: Secondary | ICD-10-CM | POA: Diagnosis not present

## 2018-04-28 ENCOUNTER — Ambulatory Visit: Payer: Medicaid Other | Admitting: Family Medicine

## 2018-04-29 ENCOUNTER — Telehealth: Payer: Self-pay

## 2018-04-29 MED ORDER — FLUCONAZOLE 150 MG PO TABS: 150.0000 mg | ORAL_TABLET | Freq: Once | ORAL | 0 refills | Status: AC

## 2018-04-29 NOTE — Telephone Encounter (Signed)
Pt called nurse line requesting rx for yeast infection. Currently on 2 antibiotics for diverticulitis. [facility] Dhyana Bastone, RN

## 2018-05-05 ENCOUNTER — Encounter: Payer: Self-pay | Admitting: Family Medicine

## 2018-05-05 ENCOUNTER — Other Ambulatory Visit: Payer: Self-pay

## 2018-05-05 ENCOUNTER — Ambulatory Visit: Payer: Medicaid Other | Admitting: Family Medicine

## 2018-05-05 VITALS — BP 124/84 | HR 92 | Temp 98.4°F | Ht 63.0 in | Wt 274.0 lb

## 2018-05-05 DIAGNOSIS — E119 Type 2 diabetes mellitus without complications: Secondary | ICD-10-CM | POA: Diagnosis not present

## 2018-05-05 MED ORDER — METFORMIN HCL 1000 MG PO TABS: 1000.0000 mg | ORAL_TABLET | Freq: Two times a day (BID) | ORAL | 3 refills | Status: DC

## 2018-05-05 MED ORDER — BLOOD GLUCOSE MONITOR KIT: PACK | 0 refills | Status: DC

## 2018-05-05 NOTE — Progress Notes (Signed)
    Subjective:     Dorothy Haynes is a 32 y.o. female who presents for follow up of diabetes.. Current symptoms include: none. Patient denies foot ulcerations, increased appetite, nausea, paresthesia of the feet, polydipsia, polyuria and vomiting. Evaluation to date has been: hemoglobin A1C. Home sugars: patient does not check sugars. Current treatments: Started metformin which has been unable to assess effectiveness. Last dilated eye exam not discussed. Patient has not been checking CBGs b/c she does not have a glucometer. She reports compliance with metformin.   The following portions of the patient's history were reviewed and updated as appropriate: allergies, current medications, past family history, past medical history, past social history, past surgical history and problem list.  Review of Systems Pertinent items noted in HPI and remainder of comprehensive ROS otherwise negative.    Objective:   Vitals:   05/05/18 1617  BP: 124/84  Pulse: 92  Temp: 98.4 F (36.9 C)  TempSrc: Oral  SpO2: 99%  Weight: 274 lb (124.3 kg)  Height: 5\' 3"  (1.6 m)   Gen: NAD, resting comfortably CV: RRR with no murmurs appreciated Pulm: NWOB, CTAB with no crackles, wheezes, or rhonchi GI: Normal bowel sounds present. Soft, Nontender, Nondistended. MSK: no edema, cyanosis, or clubbing noted Psych: Normal affect and thought content  Patient was not evaluated for proper footwear and sizing.  Laboratory: No components found for: A1C    Assessment and Plan:  Type 2 diabetes mellitus without complication, without long-term current use of insulin (HCC) Patient presenting with elevated A1c in diabetic range.  Last A1c 7.3 approximately 1 month ago.  Patient thus far is only been on metformin 500 mg once daily.  She is tolerating this well.  She has not been checking blood sugars as she could not afford a glucometer. -Increase metformin 1000 mg thousand milligrams twice daily -Rx for glucometer,  check fasting CBGs in a.m. -Follow-up in 2 months

## 2018-05-05 NOTE — Patient Instructions (Addendum)
It was a pleasure to see you today! Thank you for choosing [facility] for your primary care. Dorothy Haynes was seen for diabetes.   1. Your A1C is near goal. It is at 7.3, and our goal is 7.0.   2. We are increasing your metformin to 1000 mg two times a day.   3. I have written you a prescription for a glucometer to check you blood sugar. Check it once in the morning before eating breakfast.   Best,  Thomes Dinning, MD, MS FAMILY MEDICINE RESIDENT - PGY2 05/05/2018 4:30 PM

## 2018-05-05 NOTE — Assessment & Plan Note (Signed)
Patient presenting with elevated A1c in diabetic range.  Last A1c 7.3 approximately 1 month ago.  Patient thus far is only been on metformin 500 mg once daily.  She is tolerating this well.  She has not been checking blood sugars as she could not afford a glucometer. -Increase metformin 1000 mg thousand milligrams twice daily -Rx for glucometer, check fasting CBGs in a.m. -Follow-up in 2 months

## 2018-05-06 DIAGNOSIS — K5792 Diverticulitis of intestine, part unspecified, without perforation or abscess without bleeding: Secondary | ICD-10-CM | POA: Diagnosis not present

## 2018-05-12 ENCOUNTER — Telehealth: Payer: Self-pay

## 2018-05-12 NOTE — Telephone Encounter (Signed)
Pt called nurse line requesting another diflucan be sent into her pharmacy. Per chart review, I did not see where a diflucan has been sent in recently. Per patient, her discharge has an odor with brownish tint. I tried to offer her an apt for evaluation, she declined. "I just want another diflucan."

## 2018-05-13 MED ORDER — FLUCONAZOLE 150 MG PO TABS: 150.0000 mg | ORAL_TABLET | Freq: Once | ORAL | 0 refills | Status: AC

## 2018-05-13 NOTE — Addendum Note (Signed)
Addended by: Leland HerYOO, Kentaro Alewine J on: 05/13/2018 09:56 AM   Modules accepted: Orders

## 2018-05-13 NOTE — Telephone Encounter (Signed)
Pt contacted and informed of diflucan sent to her pharmacy.

## 2018-05-13 NOTE — Telephone Encounter (Signed)
Diflucan is reasonable as patient has had recent STD testing that was negative. If her symptoms persist 3-4 days after taking diflucan she should come in for evaluation.

## 2018-05-24 ENCOUNTER — Telehealth: Payer: Self-pay

## 2018-05-24 NOTE — Telephone Encounter (Signed)
Discussed with patient, she will start metformin 1000mg  BID and plans to follow up with me.

## 2018-05-24 NOTE — Telephone Encounter (Signed)
Patient states that Dr Janee Mornhompson increased her Metformin to 1000 mg BID but she does not want to do that without checking with PCP. Still taking 1000 mg once daily.  Call back is [phone]  Ples SpecterAlisa Quention Mcneill, RN [facility] RN)

## 2018-06-14 DIAGNOSIS — K5730 Diverticulosis of large intestine without perforation or abscess without bleeding: Secondary | ICD-10-CM | POA: Diagnosis not present

## 2018-06-14 DIAGNOSIS — K5140 Inflammatory polyps of colon without complications: Secondary | ICD-10-CM | POA: Diagnosis not present

## 2018-06-14 DIAGNOSIS — K5732 Diverticulitis of large intestine without perforation or abscess without bleeding: Secondary | ICD-10-CM | POA: Diagnosis not present

## 2018-06-18 ENCOUNTER — Telehealth: Payer: Self-pay

## 2018-06-18 MED ORDER — GLUCOSE BLOOD VI STRP: 1.0000 | ORAL_STRIP | 12 refills | Status: DC

## 2018-06-18 NOTE — Telephone Encounter (Signed)
Needs refill of Accu-Check Aviva test strips. Not on med list.  Ples Specter, RN [facility] RN)

## 2018-06-18 NOTE — Telephone Encounter (Signed)
Sent to patient pharmacy

## 2018-06-21 ENCOUNTER — Telehealth: Payer: Self-pay

## 2018-06-21 DIAGNOSIS — E119 Type 2 diabetes mellitus without complications: Secondary | ICD-10-CM

## 2018-06-21 NOTE — Telephone Encounter (Signed)
Fax from pharmacy:   Accu_Chek Softclix Lancets  [facility]

## 2018-06-22 MED ORDER — ACCU-CHEK SOFTCLIX LANCETS MISC: 12 refills | Status: DC

## 2018-06-22 NOTE — Addendum Note (Signed)
Addended by: Leland Her on: 06/22/2018 12:27 PM   Modules accepted: Orders

## 2018-07-10 ENCOUNTER — Other Ambulatory Visit: Payer: Self-pay | Admitting: Family Medicine

## 2018-07-10 DIAGNOSIS — E119 Type 2 diabetes mellitus without complications: Secondary | ICD-10-CM

## 2018-07-14 DIAGNOSIS — K5792 Diverticulitis of intestine, part unspecified, without perforation or abscess without bleeding: Secondary | ICD-10-CM | POA: Diagnosis not present

## 2018-07-21 ENCOUNTER — Encounter: Payer: Self-pay | Admitting: Family Medicine

## 2018-07-21 ENCOUNTER — Other Ambulatory Visit: Payer: Self-pay

## 2018-07-21 ENCOUNTER — Ambulatory Visit: Payer: Medicaid Other | Admitting: Family Medicine

## 2018-07-21 VITALS — BP 128/83 | HR 77 | Temp 98.3°F | Wt 266.0 lb

## 2018-07-21 DIAGNOSIS — E119 Type 2 diabetes mellitus without complications: Secondary | ICD-10-CM

## 2018-07-21 DIAGNOSIS — I10 Essential (primary) hypertension: Secondary | ICD-10-CM | POA: Diagnosis not present

## 2018-07-21 DIAGNOSIS — Z23 Encounter for immunization: Secondary | ICD-10-CM

## 2018-07-21 LAB — POCT GLYCOSYLATED HEMOGLOBIN (HGB A1C): HbA1c, POC (controlled diabetic range): 6 % (ref 0.0–7.0)

## 2018-07-21 NOTE — Patient Instructions (Signed)
.  It was good to see you today!   Good job with the diabetes control. No changes to medications. I will refer you to get a diabetic eye exam  Thank you for getting a flu shot today  Please check-out at the front desk before leaving the clinic. Make an appointment in 3 months.  We are checking your cholesterol today. If results require attention, either myself or my nurse will get in touch with you. If everything is normal, you will get a letter in the mail or a message in My Chart. Please give us a call if you do not hear from us after 2 weeks.   Please bring all of your medications with you to each visit.   Sign up for My Chart to have easy access to your labs results, and communication with your primary care physician.  Feel free to call with any questions or concerns at any time, at [phone].   Take care,  Dr. Leland HerElsia J Lowry Bala, DO [facility]

## 2018-07-21 NOTE — Progress Notes (Signed)
    Subjective:  Dorothy Haynes is a 32 y.o. female who [facility] diabetes follow up  HPI:  Diabetes States has been doing well, trying to watch what she eats. Has intentionally lost a little bit of weight recently which she is happy about Medications: taking metformin 1000mg  BID, compliant and tolerating well Polyuria- no New Visual problems- had 2 times over the last  Couple of months where she thought one of her eyes, not the same one each time, got a little blurry and self resolved quickly. Has had not recent episodes Disease Monitoring - does not check sugars at home  Hypertension Medications: taking HCTZ 12.5mg , compliant most of the time just did not take this morning, tolerating well Lightheadedness- no   Edema- no   Chest pain- no      Dyspnea- no     Obesity Has been trying to eat healthier for her diabetes, but does sometimes eat candy in case of low sugar. She does not exercise regularly.    ROS: Per HPI  Social Hx: She reports that she has been smoking cigarettes. She has a 3.40 pack-year smoking history. She has never used smokeless tobacco. She reports that she drinks alcohol. She reports that she does not use drugs.   Objective:  Physical Exam: BP 128/83   Pulse 77   Temp 98.3 F (36.8 C) (Oral)   Wt 266 lb (120.7 kg)   LMP 07/02/2018   SpO2 98%   BMI 47.12 kg/m   Gen: NAD, resting comfortably. obese CV: RRR with no murmurs appreciated Pulm: NWOB, CTAB with no crackles, wheezes, or rhonchi GI: Normal bowel sounds present. Soft, Nontender, Nondistended. MSK: no edema, cyanosis, or clubbing noted Skin: warm, dry Neuro: grossly normal, moves all extremities Psych: Normal affect and thought content  Results for orders placed or performed in visit on 07/21/18 (from the past 72 hour(s))  HgB A1c     Status: None   Collection Time: 07/21/18  8:30 AM  Result Value Ref Range   Hemoglobin A1C     HbA1c POC (<> result, manual entry)     HbA1c, POC (prediabetic range)     HbA1c, POC (controlled diabetic range) 6.0 0.0 - 7.0 %     Assessment/Plan:  Type 2 diabetes mellitus without complication, without long-term current use of insulin (HCC) Controlled and doing well at goal. Continue metformin 1000mg  BID. Discussed that patient should get a eye exam for her complaints and that if she happens to have another episode of blurry vision that she could check her glucose at that time. Counseled on PSV23, patient will consider in the future but declined today Referred to opth today.  Essential hypertension Controlled and doing well. Continue HCTZ 12.5mg  qd. Has had recent BMP.  Morbid obesity (HCC) Patient has had 4 lbs weight loss with dietary modifications. Encouraged patient efforts. Check lipid panel today.   Leland HerElsia J Yoo, DO PGY-3, [facility] 07/21/2018 8:37 AM

## 2018-07-21 NOTE — Assessment & Plan Note (Signed)
Patient has had 4 lbs weight loss with dietary modifications. Encouraged patient efforts. Check lipid panel today.

## 2018-07-21 NOTE — Assessment & Plan Note (Signed)
Controlled and doing well. Continue HCTZ 12.5mg  qd. Has had recent BMP.

## 2018-07-21 NOTE — Assessment & Plan Note (Signed)
Controlled and doing well at goal. Continue metformin 1000mg  BID. Discussed that patient should get a eye exam for her complaints and that if she happens to have another episode of blurry vision that she could check her glucose at that time. Counseled on PSV23, patient will consider in the future but declined today Referred to opth today.

## 2018-07-22 ENCOUNTER — Encounter: Payer: Self-pay | Admitting: Family Medicine

## 2018-07-22 LAB — LIPID PANEL
CHOLESTEROL TOTAL: 154 mg/dL (ref 100–199)
Chol/HDL Ratio: 3.6 ratio (ref 0.0–4.4)
HDL: 43 mg/dL (ref >39)
LDL Calculated: 78 mg/dL (ref 0–99)
Triglycerides: 164 mg/dL — ABNORMAL HIGH (ref 0–149)
VLDL Cholesterol Cal: 33 mg/dL (ref 5–40)

## 2018-08-27 ENCOUNTER — Other Ambulatory Visit: Payer: Self-pay

## 2018-08-27 ENCOUNTER — Encounter: Payer: Self-pay | Admitting: Family Medicine

## 2018-08-27 ENCOUNTER — Ambulatory Visit (INDEPENDENT_AMBULATORY_CARE_PROVIDER_SITE_OTHER): Payer: Medicaid Other | Admitting: Family Medicine

## 2018-08-27 VITALS — BP 130/70 | Temp 98.7°F | Wt 266.0 lb

## 2018-08-27 DIAGNOSIS — M7711 Lateral epicondylitis, right elbow: Secondary | ICD-10-CM | POA: Diagnosis not present

## 2018-08-27 NOTE — Progress Notes (Signed)
   Subjective:    Patient ID: Dorothy Haynes, female    DOB: 08/30/1986, 32 y.o.   MRN: [id number]   CC: Right forearm tenderness   HPI: Patient is a 32 yo female who present today complaining of right lateral forearm pain. Patient reports that pain has been present for the past month. She describes it as having a "charlie horse" in he forearm. Pain is localized to her lateral forearm. She reports weakness when she try to grab or open certain items. She denies any trauma, injury or prior similar presentation. Patient has tried NSAIDs without significant improvement in her symptoms. Patient denies any pain in her elbow and shooting pain down her arm or burning sensation.   Smoking status reviewed   ROS: all other systems were reviewed and are negative other than in the HPI   Past Medical History:  Diagnosis Date  . GERD (gastroesophageal reflux disease) 2009  . Gestational diabetes 2008  . Headache   . History of gallstones 10/2015   s/p cholecystectomy 10/09/15  . Hypertension   . Morbid obesity (HCC) 10/09/2015  . Shortness of breath dyspnea   . Tobacco abuse 10/09/2015    Past Surgical History:  Procedure Laterality Date  . CHOLECYSTECTOMY N/A 10/09/2015   Procedure: LAPAROSCOPIC CHOLECYSTECTOMY ;  Surgeon: Claud KelpHaywood Ingram, MD;  Location: [facility];  Service: General;  Laterality: N/A;  . WISDOM TOOTH EXTRACTION      Past medical history, surgical, family, and social history reviewed and updated in the EMR as appropriate.  Objective:  BP 130/70   Temp 98.7 F (37.1 C) (Oral)   Wt 266 lb (120.7 kg)   BMI 47.12 kg/m   Vitals and nursing note reviewed  General: NAD, pleasant, able to participate in exam Cardiac: RRR, normal heart sounds, no murmurs. 2+ radial and PT pulses bilaterally Respiratory: CTAB, normal effort, No wheezes, rales or rhonchi Abdomen: soft, nontender, nondistended, no hepatic or splenomegaly, +BS Extremities: right lateral forearm pain while opposing wrist  extension with elbow in full extension, no swelling or deformity noted.  Skin: warm and dry, no rashes noted Neuro: alert and oriented x4, no focal deficits Psych: Normal affect and mood   Assessment & Plan:   Lateral epicondylitis of right elbow Patient presents with right forearm pain for the past month worse with certain movement. On exam, patient is tender to palpation on the lateral aspect of her forearm. Pain is reproducible with opposition of right wrist extension. Symptoms and exam findings are consistent with lateral epicondylitis. Home exercises handout provided to patient. I also recommended strap that can be purchase at [employer] and provided of picture of the strap. Patient will follow up as need if symptoms do not improve in the next few weeks.    Lovena NeighboursAbdoulaye Ether Wolters, MD [facility] PGY-3

## 2018-08-27 NOTE — Patient Instructions (Signed)
[facility]  Ask your health care provider which exercises are safe for you. Do exercises exactly as told by your health care provider and adjust them as directed. It is normal to feel mild stretching, pulling, tightness, or discomfort as you do these exercises, but you should stop right away if you feel sudden pain or your pain gets worse. Do not begin these exercises until told by your health care provider.  Stretching and range of motion exercises  These exercises warm up your muscles and joints and improve the movement and flexibility of your elbow.  Exercise A: Wrist flexors    1. Straighten your left / right elbow in front of you with your palm up. If told by your health care provider, do this stretch with your elbow bent rather than straight.  2. With your other hand, gently pull your left / right hand and fingers toward you until you feel a gentle stretch on the top of your forearm.  3. Hold this position for __________ seconds.  Repeat __________ times. Complete this exercise __________ times a day.  Strengthening exercises  These exercises build strength and endurance in your elbow. Endurance is the ability to use your muscles for a long time, even after several repetitions.  Exercise B: Wrist flexion    1. Sit with your left / right forearm palm-up and supported on a table or other surface.  2. Let your left / right wrist extend over the edge of the surface.  3. Hold a __________ weight or a piece of rubber exercise band or tubing. Slowly curl your hand up toward your forearm. Try to only move your hand and keep the rest of your arm still.  4. Hold this position for __________ seconds.  5. Slowly return to the starting position.  Repeat __________ times. Complete this exercise __________ times a day.  Exercise C: Eccentric wrist flexion  1. Sit with your left / right forearm palm-up and supported on a table or other surface.  2. Let your left / right wrist extend over the edge of the  surface.  3. Hold a __________ weight or a piece of rubber exercise band or tubing.  4. Use your other hand to move your left / right hand up toward your forearm.  5. Slowly return to the starting position using only your left / right hand.  Repeat __________ times. Complete this exercise __________ times a day.  Exercise D: Hand turns, pronation i  1. Sit with your left / right forearm supported on a table or other surface.  2. Move your wrist so your pinkie travels toward your forearm and your thumb moves away from your forearm.  3. Hold this position for __________ seconds.  4. Slowly return to the starting position.  Exercise E: Hand turns, pronation ii    1. Sit with your left / right forearm supported on a table or other surface.  2. Hold a hammer in your left / right hand.  ? The exercise will be easier if you hold on near the head of the hammer.  ? If you hold on toward the end of the handle, the exercise will be harder.  3. Rest your left / right hand over the edge of the table with your palm facing up.  4. Without moving your elbow, slowly turn your palm and your hand down toward the table.  5. Hold this position for __________ seconds.  6. Slowly return to the starting position.    Repeat __________ times. Complete this exercise __________ times a day.  Exercise F: Shoulder blade squeeze  1. Sit in a stable chair with good posture. Do not let your back touch the back of the chair.  2. Your arms should be at your sides with your elbows bent. You can rest your forearms on a pillow.  3. Squeeze your shoulder blades together. Keep your shoulders level. Do not lift your shoulders up toward your ears.  4. Hold this position for __________ seconds.  5. Slowly release.  Repeat __________ times. Complete this exercise __________ times a day.  This information is not intended to replace advice given to you by your health care provider. Make sure you discuss any questions you have with your health care  provider.  Document Released: 08/18/2005 Document Revised: 04/24/2016 Document Reviewed: 04/30/2015  Elsevier Interactive Patient Education  2019 [employer]vier Inc[employer]

## 2018-08-27 NOTE — Assessment & Plan Note (Signed)
Patient presents with right forearm pain for the past month worse with certain movement. On exam, patient is tender to palpation on the lateral aspect of her forearm. Pain is reproducible with opposition of right wrist extension. Symptoms and exam findings are consistent with lateral epicondylitis. Home exercises handout provided to patient. I also recommended strap that can be purchase at [employer] and provided of picture of the strap. Patient will follow up as need if symptoms do not improve in the next few weeks.

## 2018-08-31 ENCOUNTER — Other Ambulatory Visit: Payer: Self-pay | Admitting: Family Medicine

## 2018-08-31 DIAGNOSIS — E119 Type 2 diabetes mellitus without complications: Secondary | ICD-10-CM

## 2018-09-08 ENCOUNTER — Encounter (INDEPENDENT_AMBULATORY_CARE_PROVIDER_SITE_OTHER): Payer: Self-pay | Admitting: Ophthalmology

## 2018-09-10 ENCOUNTER — Telehealth: Payer: Self-pay

## 2018-09-10 NOTE — Telephone Encounter (Signed)
Patient left message that she is still having significant pain in her right arm. She has made a f/u appt for 1/21 but would like some advice on what to do in the meantime to help her arm pain.  Call back is [phone]  Ples Specter, RN [facility] RN)

## 2018-09-13 NOTE — Telephone Encounter (Signed)
Forwarding to provider who assessed her for this complaint.

## 2018-09-14 ENCOUNTER — Other Ambulatory Visit: Payer: Self-pay | Admitting: Family Medicine

## 2018-09-14 DIAGNOSIS — M7711 Lateral epicondylitis, right elbow: Secondary | ICD-10-CM

## 2018-09-14 NOTE — Telephone Encounter (Signed)
Attempted to contact pt again without success. SM should call her to schedule. If she has not heard from them by apt with Janee Morn, we can inform and schedule then.

## 2018-09-14 NOTE — Telephone Encounter (Signed)
Attempted to call  patient to inform her that I made a referral to sports medicine, she should hear from them in a 1-2 days to schedule appointment.  She also has an appt at [facility] on 1/21 with Dr.Thompson, hopefully can be seen sooner by sports.   Alisa could you try again later to inform patient, if not able leave voice message.  Thanks  Lovena Neighbours, MD [facility] Family Medicine, PGY-3

## 2018-09-20 NOTE — Progress Notes (Signed)
[facility] Note  09/21/2018     CHIEF COMPLAINT Patient presents for Diabetic Eye Exam   HISTORY OF PRESENT ILLNESS: Dorothy Haynes is a 33 y.o. female who presents to the clinic today for:   HPI    Diabetic Eye Exam    Vision fluctuates with blood sugars.  Associated Symptoms Negative for Fatigue, Fever, Jaw Claudication, Trauma, Photophobia, Pain, Distortion, Weight Loss, Shoulder/Hip pain, Scalp Tenderness, Glare, Redness and Blind Spot.  Diabetes characteristics include Type 2.  This started 8 months ago.  Last A1C 6.  I, the attending physician,  performed the HPI with the patient and updated documentation appropriately.          Comments    Pt presents for DM exam, pt states she was referred here by her PCP, she states she has been diabetic since July 2019, she states she does not check her blood sugar at home, but can tell when it's high or low bc her vision becomes blurry, she states her last A1C was 6.0 and she is taking metformin       Last edited by Bernarda Caffey, MD on 09/21/2018  8:33 AM. 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 Orson Eva for DM exam LEE:  Ocular Hx- PMH-DM (A1C: 7.9, metformin), HTN, smoker    CURRENT MEDICATIONS: No current outpatient medications on file. (Ophthalmic Drugs)   No current facility-administered medications for this visit.  (Ophthalmic Drugs)   Current Outpatient Medications (Other)  Medication Sig  . metFORMIN (GLUCOPHAGE) 1000 MG tablet Take 1 tablet (1,000 mg total) by  mouth 2 (two) times daily with a meal.  . ACCU-CHEK SOFTCLIX LANCETS lancets Use as instructed  . albuterol (PROVENTIL HFA;VENTOLIN HFA) 108 (90 Base) MCG/ACT inhaler Inhale 1-2 puffs into the lungs every 6 (six) hours as needed for wheezing or shortness of breath.  . blood glucose meter kit and supplies KIT Dispense based on patient and insurance preference. Use up to four times daily as directed. (FOR ICD-9 250.00, 250.01).  Marland Kitchen glucose blood (ACCU-CHEK AVIVA) test strip 1 each by Other route as directed. Use as instructed  . hydrochlorothiazide (HYDRODIURIL) 12.5 MG tablet Take 1 tablet (12.5 mg total) by mouth daily.  . metFORMIN (GLUCOPHAGE) 500 MG tablet TAKE 1 TABLET BY MOUTH EVERY DAY WITH BREAKFAST (Patient not taking: Reported on 09/21/2018)   No current facility-administered medications for this visit.  (Other)      REVIEW OF SYSTEMS: ROS    Positive for: Endocrine, Cardiovascular, Eyes   Negative for: Constitutional, Gastrointestinal, Neurological, Skin, Genitourinary, Musculoskeletal, HENT, Respiratory, Psychiatric, Allergic/Imm, Heme/Lymph   Last edited by Debbrah Alar, COT on 09/21/2018  8:25 AM. (History)       ALLERGIES No Known Allergies  PAST MEDICAL HISTORY Past Medical History:  Diagnosis Date  . GERD (gastroesophageal reflux disease) 2009  . Gestational diabetes 2008  . Headache   . History of gallstones 10/2015   s/p cholecystectomy 10/09/15  . Hypertension   .  Morbid obesity ([place]) 10/09/2015  . Shortness of breath dyspnea   . Tobacco abuse 10/09/2015   Past Surgical History:  Procedure Laterality Date  . CHOLECYSTECTOMY N/A 10/09/2015   Procedure: LAPAROSCOPIC CHOLECYSTECTOMY ;  Surgeon: Fanny Skates, MD;  Location: [facility];  Service: General;  Laterality: N/A;  . WISDOM TOOTH EXTRACTION      FAMILY HISTORY Family History  Problem Relation Age of Onset  . Diabetes Mother   . Hypertension Mother   . Cataracts Mother   . Throat cancer Father   .  Kidney Stones Father   . Alzheimer's disease Other   . Diabetes Other   . Hypertension Other   . Heart disease Other   . Heart disease Other   . Diabetes Other   . Hypertension Other   . Thyroid disease Other   . Diabetes Maternal Grandmother   . Diabetes Maternal Grandfather   . Blindness Maternal Aunt   . Amblyopia Neg Hx   . Glaucoma Neg Hx   . Macular degeneration Neg Hx   . Retinal detachment Neg Hx   . Strabismus Neg Hx   . Retinitis pigmentosa Neg Hx     SOCIAL HISTORY Social History   Tobacco Use  . Smoking status: Current Every Day Smoker    Packs/day: 0.20    Years: 17.00    Pack years: 3.40    Types: Cigarettes  . Smokeless tobacco: Never Used  Substance Use Topics  . Alcohol use: Yes    Comment: occ, 1-2 drink per week  . Drug use: No    Comment: previously used MJ, has been off recreational drugs         OPHTHALMIC EXAM:  Base Eye Exam    Visual Acuity (Snellen - Linear)      Right Left   Dist [place] 20/20 20/20       Tonometry (Tonopen, 8:31 AM)      Right Left   Pressure 19 18       Pupils      Dark Light Shape React APD   Right 4 2 Round Brisk None   Left 4 2 Round Brisk None       Visual Fields (Counting fingers)      Left Right    Full Full       Extraocular Movement      Right Left    Full, Ortho Full, Ortho       Neuro/Psych    Oriented x3:  Yes   Mood/Affect:  Normal       Dilation    Both eyes:  1.0% Mydriacyl, 2.5% Phenylephrine @ 8:31 AM        Slit Lamp and Fundus Exam    External Exam      Right Left   External Normal Normal       Slit Lamp Exam      Right Left   Lids/Lashes Normal Normal   Conjunctiva/Sclera mild Melanosis mild Melanosis   Cornea 1+ Punctate epithelial erosions 1+ Punctate epithelial erosions   Anterior Chamber deep and clear deep and clear   Iris Round and dilated Round and dilated   Lens 1+ Nuclear sclerosis, 1-2+ Cortical cataract 1+ Nuclear sclerosis, 1-2+ Cortical cataract    Vitreous Vitreous syneresis Normal       Fundus Exam      Right Left   Disc Pink and Sharp, no NVD Pink and Sharp   C/D Ratio 0.3 0.4   Macula Good foveal  reflex, No heme or edema Good foveal reflex, No heme or edema   Vessels Vascular attenuation, Tortuous Vascular attenuation, Tortuous   Periphery Attached, peripheral cystoid degenration, No heme  Attached, No heme           IMAGING AND PROCEDURES  Imaging and Procedures for _0 @  OCT, Retina - OU - Both Eyes       Right Eye Quality was good. Central Foveal Thickness: 246. Progression has no prior data. Findings include normal foveal contour, no IRF, no SRF.   Left Eye Central Foveal Thickness: 260. Progression has no prior data. Findings include normal foveal contour, no IRF, no SRF.   Notes *Images captured and stored on drive  Diagnosis / Impression:  NFP; no IRF/SRF OU No DME OU  Clinical management:  See below  Abbreviations: NFP - Normal foveal profile. CME - cystoid macular edema. PED - pigment epithelial detachment. IRF - intraretinal fluid. SRF - subretinal fluid. EZ - ellipsoid zone. ERM - epiretinal membrane. ORA - outer retinal atrophy. ORT - outer retinal tubulation. SRHM - subretinal hyper-reflective material_1                 ASSESSMENT/PLAN:    ICD-10-CM   1. Diabetes mellitus type 2 without retinopathy ([place]) E11.9   2. Retinal edema H35.81 OCT, Retina - OU - Both Eyes  3. Essential hypertension I10   4. Hypertensive retinopathy of both eyes H35.033   5. Combined forms of age-related cataract of both eyes H25.813     1,2. Diabetes mellitus, type 2 without retinopathy  - The incidence, risk factors for progression, natural history and treatment options for diabetic retinopathy  were discussed with patient.    - The need for close monitoring of blood glucose, blood pressure, and serum lipids, avoiding cigarette or any type of tobacco, and the need for long term follow up was also  discussed with patient.  - f/u in 1 year, sooner prn  3,4. Hypertensive retinopathy OU  - discussed importance of tight BP control  - monitor  5. Mild Mixed form cataract OU  - The symptoms of cataract, surgical options, and treatments and risks were discussed with patient.  - discussed diagnosis and progression  - very mild/early -- not yet visually significant  - monitor for now   Ophthalmic Meds Ordered this visit:  No orders of the defined types were placed in this encounter.      Return in about 1 year (around 09/22/2019) for DM exam.  There are no Patient Instructions on file for this visit.   Explained the diagnoses, plan, and follow up with the patient and they expressed understanding.  Patient expressed understanding of the importance of proper follow up care.   This document serves as a record of services personally performed by Gardiner Sleeper, MD, PhD. It was created on their behalf by Ernest Mallick, OA, an ophthalmic assistant. The creation of this record is the provider's dictation and/or activities during the visit.    Electronically signed by: Ernest Mallick, OA  01.20.2020 1:17 PM    Gardiner Sleeper, M.D., Ph.D. Diseases & Surgery of the Retina and Vitreous [facility]  I have reviewed the above documentation for accuracy and completeness, and I agree with the above. Gardiner Sleeper, M.D., Ph.D. 09/21/18 1:17 PM     Abbreviations: M myopia (nearsighted); A astigmatism; H hyperopia (farsighted); P presbyopia; Mrx spectacle prescription;  CTL contact lenses; OD right eye; OS left  eye; OU both eyes  XT exotropia; ET esotropia; PEK punctate epithelial keratitis; PEE punctate epithelial erosions; DES dry eye syndrome; MGD meibomian gland dysfunction; ATs artificial tears; PFAT's preservative free artificial tears; [place] nuclear sclerotic cataract; PSC posterior subcapsular cataract; ERM epi-retinal membrane; PVD posterior vitreous detachment; RD  retinal detachment; DM diabetes mellitus; DR diabetic retinopathy; NPDR non-proliferative diabetic retinopathy; PDR proliferative diabetic retinopathy; CSME clinically significant macular edema; DME diabetic macular edema; dbh dot blot hemorrhages; CWS cotton wool spot; POAG primary open angle glaucoma; C/D cup-to-disc ratio; HVF humphrey visual field; GVF goldmann visual field; OCT optical coherence tomography; IOP intraocular pressure; BRVO Branch retinal vein occlusion; CRVO central retinal vein occlusion; CRAO central retinal artery occlusion; BRAO branch retinal artery occlusion; RT retinal tear; SB scleral buckle; PPV pars plana vitrectomy; VH Vitreous hemorrhage; PRP panretinal laser photocoagulation; IVK intravitreal kenalog; VMT vitreomacular traction; MH Macular hole;  NVD neovascularization of the disc; NVE neovascularization elsewhere; AREDS age related eye disease study; ARMD age related macular degeneration; POAG primary open angle glaucoma; EBMD epithelial/anterior basement membrane dystrophy; ACIOL anterior chamber intraocular lens; IOL intraocular lens; PCIOL posterior chamber intraocular lens; Phaco/IOL phacoemulsification with intraocular lens placement; [place] photorefractive keratectomy; LASIK laser assisted in situ keratomileusis; HTN hypertension; DM diabetes mellitus; COPD chronic obstructive pulmonary disease

## 2018-09-21 ENCOUNTER — Encounter (INDEPENDENT_AMBULATORY_CARE_PROVIDER_SITE_OTHER): Payer: Self-pay | Admitting: Ophthalmology

## 2018-09-21 ENCOUNTER — Ambulatory Visit: Payer: Medicaid Other | Admitting: Family Medicine

## 2018-09-21 ENCOUNTER — Ambulatory Visit (INDEPENDENT_AMBULATORY_CARE_PROVIDER_SITE_OTHER): Payer: Medicaid Other | Admitting: Ophthalmology

## 2018-09-21 DIAGNOSIS — I10 Essential (primary) hypertension: Secondary | ICD-10-CM | POA: Diagnosis not present

## 2018-09-21 DIAGNOSIS — H3581 Retinal edema: Secondary | ICD-10-CM | POA: Diagnosis not present

## 2018-09-21 DIAGNOSIS — E119 Type 2 diabetes mellitus without complications: Secondary | ICD-10-CM

## 2018-09-21 DIAGNOSIS — H35033 Hypertensive retinopathy, bilateral: Secondary | ICD-10-CM

## 2018-09-21 DIAGNOSIS — H25813 Combined forms of age-related cataract, bilateral: Secondary | ICD-10-CM | POA: Diagnosis not present

## 2018-09-29 ENCOUNTER — Ambulatory Visit: Payer: Medicaid Other | Admitting: Sports Medicine

## 2018-09-29 VITALS — BP 137/92 | Ht 63.0 in | Wt 266.0 lb

## 2018-09-29 DIAGNOSIS — M79601 Pain in right arm: Secondary | ICD-10-CM | POA: Diagnosis not present

## 2018-09-29 NOTE — Progress Notes (Signed)
   HPI  CC: Right arm pain  Dorothy Haynes is a 33 year old female who presents for right arm pain.  She states the pain started around 2 months ago.  She does not call any inciting event.  She is not currently trauma to the area.  She states she has numbness down the side of her arm going over the dorsum of her forearm into her whole hand.  She also reports some weakness with handgrip.  She also reports cramping pain in her upper arm at nighttime.  She states she has been rubbing alcohol over her arm with no improvement.  She has not tried any NSAIDs.  She is also been trying to force ligament without any improvement.  She states is worse when she uses her arm throughout the day.  She states this also bothers her at nighttime.  Past Injuries: broken right arm as teenager  Past Surgeries: none  Smoking: pack a day, 3.4 pyh  Family Hx: non contributory   ROS: Per HPI; in addition no fever, no rash, no additional weakness, no additional numbness, no additional paresthesias, and no additional falls/injury.   I reviewed all medical history, medications, allergies at today's visit.  Objective: BP (!) 137/92   Ht 5\' 3"  (1.6 m)   Wt 266 lb (120.7 kg)   BMI 47.12 kg/m  Gen: Right-Hand Dominant. NAD, well groomed, a/o x3, normal affect.  CV: Well-perfused. Warm.  Resp: Non-labored.  Neuro: Sensation intact throughout. No gross coordination deficits.  Gait: Nonpathologic posture, unremarkable stride without signs of limp or balance issues.  Neck exam: No erythema, warmth, swelling noted.  Tenderness palpation over the cervical spine around C3-C6.  Full range of motion in flexion, extension, cytocide motion of the neck.  Strength 5 out of 5 as noted below and upper extremity testing.  Equivocal Spurling sign (numbness already present).  Right arm exam: No erythema, warmth, swelling noted.  Mild tenderness palpation over the medial epicondyle.  Mild tenderness palpation over the lateral shoulder.  No  tenderness palpation of the hands.  Full range of motion of the shoulder.  Full range of motion of the elbow.  Full range of motion at the wrist.  5 out of 5 strength throughout all testing of the hand or wrist shoulder.  Positive Tinel sign at the wrist.  Negative ulnar Tinel sign.  Equal handgrip.  Assessment and Plan: Right arm pain, with radicular symptoms.  We talked by treatment options today.  I will take an EMG today to assess if this is peripheral neuropathy versus coming from the cervical spine.  I will also go ahead and start her in PT for neck range of motion and strengthening.  I will follow her up after the EMG.  Thing on these results, we may need to obtain an MRI of the cervical spine to further evaluate.  If it is a peripheral neuropathy, we will treat depending on what the EMG shows.  Alric Quan, MD [facility] Sports Medicine Fellow 09/29/2018 1:35 PM  I was the preceptor for this visit and available for immediate consultation Marsa Aris, DO

## 2018-10-01 ENCOUNTER — Ambulatory Visit: Payer: Medicaid Other | Admitting: Neurology

## 2018-10-01 ENCOUNTER — Encounter (INDEPENDENT_AMBULATORY_CARE_PROVIDER_SITE_OTHER): Payer: Medicaid Other | Admitting: Neurology

## 2018-10-01 ENCOUNTER — Encounter: Payer: Self-pay | Admitting: Neurology

## 2018-10-01 DIAGNOSIS — R202 Paresthesia of skin: Secondary | ICD-10-CM

## 2018-10-01 DIAGNOSIS — Z0289 Encounter for other administrative examinations: Secondary | ICD-10-CM

## 2018-10-01 NOTE — Procedures (Signed)
        Full Name: Dorothy Haynes Gender: Female MRN #: [id number] Date of Birth: 24-Sep-1985    Visit Date: 10/01/2018 08:59 Age: 33 Years 5 Months Old Examining Physician: Levert Feinstein, MD  Referring Physician: Alric Quan, MD History: 33 year old female presented with right arm pain  Summary of the tests:  Nerve conduction study: Right median, ulnar sensory and motor responses were normal.  Electromyography: Selected needle examination of right upper extremity muscles were normal.   Conclusion: This is a normal study.  There is no electrodiagnostic evidence of right upper extremity neuropathy or right cervical radiculopathy.    ------------------------------- Levert Feinstein, M.D. PhD  [facility] [address] [place], [place] [address] Tel: [phone] Fax: [phone]        [facility]    Nerve / Sites Muscle Latency Ref. Amplitude Ref. Rel Amp Segments Distance Velocity Ref. Area    ms ms mV mV %  cm m/s m/s mVms  R Median - APB     Wrist APB 3.9 ?4.4 7.3 ?4.0 100 Wrist - APB 7   30.2     Upper arm APB 7.6  7.0  96 Upper arm - Wrist 21 57 ?49 28.8  R Ulnar - ADM     Wrist ADM 2.5 ?3.3 9.6 ?6.0 100 Wrist - ADM 7   38.2     B.Elbow ADM 5.7  9.0  93 B.Elbow - Wrist 18 56 ?49 37.7     A.Elbow ADM 7.8  8.7  96.8 A.Elbow - B.Elbow 10 49 ?49 38.2         A.Elbow - Wrist             SNC    Nerve / Sites Rec. Site Peak Lat Ref.  Amp Ref. Segments Distance    ms ms V V  cm  R Median - Orthodromic (Dig II, Mid palm)     Dig II Wrist 3.5 ?3.4 10 ?10 Dig II - Wrist 13  R Ulnar - Orthodromic, (Dig V, Mid palm)     Dig V Wrist 2.4 ?3.1 10 ?5 Dig V - Wrist 61         F  Wave    Nerve F Lat Ref.   ms ms  R Ulnar - ADM 26.0 ?32.0       EMG full       EMG Summary Table    Spontaneous MUAP Recruitment  Muscle IA Fib PSW Fasc Other Amp Dur. Poly Pattern  R. First dorsal interosseous Normal None None None _______ Normal Normal Normal Normal  R. Pronator teres  Normal None None None _______ Normal Normal Normal Normal  R. Supinator Normal None None None _______ Normal Normal Normal Normal  R. Flexor carpi ulnaris Normal None None None _______ Normal Normal Normal Normal  R. Brachioradialis Normal None None None _______ Normal Normal Normal Normal  R. Deltoid Normal None None None _______ Normal Normal Normal Normal  R. Biceps brachii Normal None None None _______ Normal Normal Normal Normal  R. Triceps brachii Normal None None None _______ Normal Normal Normal Normal

## 2018-10-04 ENCOUNTER — Telehealth: Payer: Self-pay | Admitting: Sports Medicine

## 2018-10-04 NOTE — Telephone Encounter (Signed)
-----   Message from [facility] sent at 10/04/2018 10:26 AM EST ----- Regarding: FW: phone message Take a look at her EMG results and let me know if you want to proceed with an MRI. Thanks! ----- Message ----- From: Lizbeth Bark Sent: 10/04/2018  10:16 AM EST To: Rutha Bouchard Subject: phone message                                  Pt called asking if she needs to get an MRI. States she had nerve conduction study on Friday and was told she should have an MRI.

## 2018-10-05 ENCOUNTER — Telehealth: Payer: Self-pay | Admitting: Family Medicine

## 2018-10-05 NOTE — Telephone Encounter (Signed)
Will forward to MD.  Patient has an appointment to see her on 10/25/2018. Maragret Vanacker,CMA

## 2018-10-05 NOTE — Telephone Encounter (Signed)
Pt called and is requesting a referral to have a sleep study done. jw

## 2018-10-06 NOTE — Telephone Encounter (Signed)
Will address at scheduled appt

## 2018-10-13 ENCOUNTER — Other Ambulatory Visit: Payer: Self-pay

## 2018-10-13 ENCOUNTER — Encounter: Payer: Self-pay | Admitting: Physical Therapy

## 2018-10-13 ENCOUNTER — Ambulatory Visit: Payer: Medicaid Other | Attending: Sports Medicine | Admitting: Physical Therapy

## 2018-10-13 DIAGNOSIS — R293 Abnormal posture: Secondary | ICD-10-CM | POA: Diagnosis present

## 2018-10-13 DIAGNOSIS — G8929 Other chronic pain: Secondary | ICD-10-CM | POA: Insufficient documentation

## 2018-10-13 DIAGNOSIS — M5412 Radiculopathy, cervical region: Secondary | ICD-10-CM | POA: Diagnosis present

## 2018-10-13 DIAGNOSIS — M25511 Pain in right shoulder: Secondary | ICD-10-CM | POA: Diagnosis not present

## 2018-10-13 DIAGNOSIS — M6281 Muscle weakness (generalized): Secondary | ICD-10-CM | POA: Insufficient documentation

## 2018-10-13 NOTE — Therapy (Addendum)
[facility] [address] [place], [place], [address] Phone: [phone]   Fax:  [phone]  Physical Therapy Evaluation  Patient Details  Name: Dorothy Haynes MRN: [id number] Date of Birth: 12/15/1985 Referring Provider (PT): Gerarda Guntherixon, Blake L, MD   Encounter Date: 10/13/2018  PT End of Session - 10/13/18 0931    Visit Number  1    Number of Visits  13    Date for PT Re-Evaluation  12/08/18    Authorization Type  MCD: resubmit at 4th visit    PT Start Time  0849    PT Stop Time  0929    PT Time Calculation (min)  40 min    Activity Tolerance  Patient tolerated treatment well    Behavior During Therapy  [facility] for tasks assessed/performed       Past Medical History:  Diagnosis Date  . Diverticulitis 03/15/2018  . GERD (gastroesophageal reflux disease) 2009  . Gestational diabetes 2008  . Headache   . History of gallstones 10/2015   s/p cholecystectomy 10/09/15  . Hypertension   . Morbid obesity (HCC) 10/09/2015  . Shortness of breath dyspnea   . Tobacco abuse 10/09/2015    Past Surgical History:  Procedure Laterality Date  . CHOLECYSTECTOMY N/A 10/09/2015   Procedure: LAPAROSCOPIC CHOLECYSTECTOMY ;  Surgeon: Claud KelpHaywood Ingram, MD;  Location: [facility];  Service: General;  Laterality: N/A;  . WISDOM TOOTH EXTRACTION      There were no vitals filed for this visit.   Subjective Assessment - 10/13/18 0855    Subjective  pt is a 33 y.o F with CC of R shoulder/ arm starting 3 months ago with no specific onset. pt notes cramp started in the forearm and progressed to the shoulder. pt reports pain and N/T in the hand that occurred before this started. prior reports breaking her arm when she was younger but no other PHMx. since onset the pain seems to stay the same.    Limitations  Lifting;House hold activities    How long can you sit comfortably?  unlimited    How long can you stand comfortably?  unlimited    How long can you walk  comfortably?  unlimited    Diagnostic tests  N/A    Patient Stated Goals  to decrease pain, get the arm back to normal     Currently in Pain?  Yes    Pain Score  5    at worst 10/10,    Pain Location  Arm    Pain Orientation  Right    Pain Descriptors / Indicators  Burning;Throbbing;Numbness;Aching    Pain Type  Chronic pain    Pain Radiating Towards  in the hand and wrist/ fingers    Pain Onset  More than a month ago    Pain Frequency  Constant    Aggravating Factors   sleeping,    Pain Relieving Factors  medication, moving around     Effect of Pain on Daily Activities  limited RUE movement         [facility] - 10/13/18 0902      Assessment   Medical Diagnosis  Right arm pain     Referring Provider (PT)  Gerarda Guntherixon, Blake L, MD    Onset Date/Surgical Date  --   3 months ago   Hand Dominance  Left    Next MD Visit  --   make one after PT   Prior Therapy  no  Precautions   Precautions  None      Restrictions   Weight Bearing Restrictions  No      Balance Screen   Has the patient fallen in the past 6 months  No    Has the patient had a decrease in activity level because of a fear of falling?   No    Is the patient reluctant to leave their home because of a fear of falling?   No      Home Environment   Living Environment  Private residence    Living Arrangements  Spouse/significant other    Available Help at Discharge  Family    Type of Home  House    Home Access  Stairs to enter    Entrance Stairs-Number of Steps  5    Entrance Stairs-Rails  Can reach both    Home Layout  One level      Prior Function   Level of Independence  Independent    Vocation  Full time employment   driving   Vocation Requirements  prolonged sitting    Leisure  exercising      Cognition   Overall Cognitive Status  Within Functional Limits for tasks assessed      Posture/Postural Control   Posture/Postural Control  Postural limitations    Postural Limitations  Rounded  Shoulders;Forward head      ROM / Strength   AROM / PROM / Strength  PROM;AROM;Strength      AROM   Overall AROM Comments  all joints WFL, increased pain inthe lateral epicondyle on the R with shoulder IR    AROM Assessment Site  Shoulder;Elbow;Wrist;Cervical    Right/Left Shoulder  Right;Left    Right/Left Elbow  Right;Left    Right/Left Wrist  Left    Cervical Flexion  30    Cervical Extension  34    Cervical - Right Side Bend  38    Cervical - Left Side Bend  42   ERP pain noted on the R   Cervical - Right Rotation  59    Cervical - Left Rotation  61      Strength   Strength Assessment Site  Shoulder;Hand;Elbow    Right/Left Shoulder  Right;Left    Right Shoulder Flexion  4-/5    Right Shoulder Extension  4-/5    Right Shoulder ABduction  4-/5    Right Shoulder Internal Rotation  4-/5    Right Shoulder External Rotation  4-/5    Left Shoulder Extension  4+/5    Left Shoulder ABduction  4+/5    Left Shoulder Internal Rotation  4+/5    Left Shoulder External Rotation  4+/5    Right/Left Elbow  Right;Left    Right Elbow Flexion  4-/5    Right Elbow Extension  4-/5    Left Elbow Flexion  4+/5    Left Elbow Extension  4+/5    Right Hand Grip (lbs)  12.6   11,14, 13   Left Hand Grip (lbs)  30.3   33,31,27     Palpation   Palpation comment  TTP along the R upper trap/ levator scapulae, sub-occipitals, and the commone extensors on the R elbow      Special Tests    Special Tests  Cervical    Cervical Tests  Spurling's;Dictraction;other      Spurling's   Findings  Positive    Side  Right    Comment  increased concordant pain  Distraction Test   Findngs  Positive    side  Right      other    Findings  Positive    Comment  ULTT on the R                 Objective measurements completed on examination: See above findings.              PT Education - 10/13/18 0931    Education Details  evaluation findings, POC, goals, HEP with proper  form/ rationale.     Person(s) Educated  Patient    Methods  Explanation;Verbal cues;Handout    Comprehension  Verbalized understanding;Verbal cues required       PT Short Term Goals - 10/13/18 0949      PT SHORT TERM GOAL #1   Title  pt to be I with inital HEP    Baseline  no previous HEP    Time  3    Period  Weeks    Status  New    Target Date  11/03/18      PT SHORT TERM GOAL #2   Title  pt to verbalize/ demo proper psoture and lifting mechanics to prevent and reduce muscle tension and pain    Baseline  know previous knowledge of proper posture    Time  3    Period  Weeks    Status  New    Target Date  11/03/18      PT SHORT TERM GOAL #3   Title  increase L grip strength by >/5# to demo improvement in shoulder function     Baseline  avg 12.6# in L     Time  3    Period  Weeks    Status  New    Target Date  11/03/18        PT Long Term Goals - 10/13/18 0951      PT LONG TERM GOAL #1   Title  increase shoulder/ elbow strength to >/= 4/5 to promote shoulder/ elbow stability     Baseline  grossly 4-/5 in all planes    Time  6    Period  Weeks    Status  New    Target Date  12/08/18      PT LONG TERM GOAL #2   Title  decrease upper trap and surrounding muscle spasm to reduce pain to </= 2/10 and maintain cervical ROM     Baseline  significant upper trap spasm with 5/10 pain  (10/10 at worst)    Time  6    Period  Weeks    Status  New    Target Date  12/08/18      PT LONG TERM GOAL #3   Title  pt ot be able to lift and lower >/= 8 # to and from an overhead shelf and push/ pull >/= 15 # with RUE for functional ADLS    Baseline  unable to lift more than 1-2# per pt report.     Time  6    Period  Weeks    Status  New    Target Date  12/08/18      PT LONG TERM GOAL #4   Title  pt to be I with all HP given as of last visit to maintain and progress current level of function independently    Baseline  no previous HEP    Time  6    Period  Weeks  Status  New     Target Date  12/08/18             Plan - 10/13/18 0932    Clinical Impression Statement  pt present to OPPT with CC of R shoulder elbow and wrist pain starting 3 months ago with no specific. She demosntrates 3/4 clusture testing for cervical radiculopathy suggesting increased probability of radiculopathy. she demonstrates fucntional ROM for neck and RUE with general weakness noted. She would benefit from physical therapy to decrease neck and shoulder pain, promote proper posture, improve strength and maximize overall function by addressing the deficits listeed.     History and Personal Factors relevant to plan of care:  hx of diverticulitis, and obesity    Clinical Presentation  Evolving    Clinical Presentation due to:  gen weakness, mutiple sites of pain, N/T into the wrist    Clinical Decision Making  Moderate    Rehab Potential  Good    PT Frequency  2x / week    PT Duration  6 weeks   inital auth 1 x a week for 3 weeks   PT Treatment/Interventions  ADLs/Self Care Home Management;Cryotherapy;Electrical Stimulation;Iontophoresis 4mg /ml Dexamethasone;Moist Heat;Traction;Therapeutic exercise;Therapeutic activities;Manual techniques;Taping;Dry needling;Gait training;Patient/family education;Passive range of motion    PT Next Visit Plan  review/ update HEP, posture education, trial manual cervical traction if tolerated well progress to mechanical. update HEP for elbow/ wrist PRN,     PT Home Exercise Plan  chin tuck (sitting), upper trap / levator scapulae stretching, Rows    Consulted and Agree with Plan of Care  Patient       Patient will benefit from skilled therapeutic intervention in order to improve the following deficits and impairments:  Pain, Impaired UE functional use, Improper body mechanics, Postural dysfunction, Decreased endurance, Decreased strength, Increased fascial restricitons  Visit Diagnosis: Chronic right shoulder pain - Plan: PT plan of care  cert/re-cert  Radiculopathy, cervical region - Plan: PT plan of care cert/re-cert  Abnormal posture - Plan: PT plan of care cert/re-cert  Muscle weakness (generalized) - Plan: PT plan of care cert/re-cert     Problem List Patient Active Problem List   Diagnosis Date Noted  . Lateral epicondylitis of right elbow 08/27/2018  . Type 2 diabetes mellitus without complication, without long-term current use of insulin (HCC) 03/22/2018  . Loud snoring 03/22/2018  . Essential hypertension 02/08/2018  . Missed periods 10/23/2016  . Morbid obesity (HCC) 10/09/2015  . Tobacco abuse 10/09/2015   Lulu Riding PT, DPT, LAT, ATC  10/13/18  11:01 AM      [facility] [address] [place], [place], [address] Phone: [phone]   Fax:  [phone]  Name: Dorothy Haynes MRN: [id number] Date of Birth: 01-22-1986

## 2018-10-20 ENCOUNTER — Ambulatory Visit: Payer: Medicaid Other | Admitting: Physical Therapy

## 2018-10-20 ENCOUNTER — Telehealth: Payer: Self-pay | Admitting: Physical Therapy

## 2018-10-20 NOTE — Telephone Encounter (Signed)
Called patient about missed visit.  She  York Spaniel she was just here and discovered her insurance does not cover PT.  She cannot afford PT right now.  She wants to keep her next appointment on Feb.  26 th as she is checking into getting coverage.  She will call if she is unable to attend. Liz Beach PTA

## 2018-10-22 ENCOUNTER — Telehealth: Payer: Self-pay

## 2018-10-22 DIAGNOSIS — M79601 Pain in right arm: Secondary | ICD-10-CM

## 2018-10-22 NOTE — Telephone Encounter (Signed)
I told the pt that once she gets her insurance figured out we will set up the MRI of her cervical spine. Pt states she will call sometime in the next week or 2 with updated insurance information so we can proceed.

## 2018-10-25 ENCOUNTER — Ambulatory Visit (INDEPENDENT_AMBULATORY_CARE_PROVIDER_SITE_OTHER): Payer: Self-pay | Admitting: Family Medicine

## 2018-10-25 ENCOUNTER — Other Ambulatory Visit: Payer: Self-pay

## 2018-10-25 ENCOUNTER — Encounter: Payer: Self-pay | Admitting: Family Medicine

## 2018-10-25 VITALS — BP 126/90 | Temp 98.6°F | Ht 63.0 in | Wt 272.0 lb

## 2018-10-25 DIAGNOSIS — I10 Essential (primary) hypertension: Secondary | ICD-10-CM

## 2018-10-25 DIAGNOSIS — E119 Type 2 diabetes mellitus without complications: Secondary | ICD-10-CM

## 2018-10-25 DIAGNOSIS — R0683 Snoring: Secondary | ICD-10-CM

## 2018-10-25 LAB — POCT GLYCOSYLATED HEMOGLOBIN (HGB A1C): HbA1c, POC (controlled diabetic range): 6 % (ref 0.0–7.0)

## 2018-10-25 NOTE — Progress Notes (Signed)
    Subjective:  Dorothy Haynes is a 33 y.o. female who presents to the [facility] today for diabetes follow-up  HPI:  Diabetes Taking metformin 1000mg  BID and tumeric. Tolerating well.  No polyuria, polydipsia, chest pain, shortness of breath, numbness or tingling in her extremities. She really struggles with following a diabetic diet.  For example today she ate a chicken Caesar salad, with a Probation officer, with a few chocolate frosty bites.  She says that she also keeps candy in her car throughout the day.  She does not drink soda.  She works for [employer] and her biggest challenge is packing her own healthy meals so she finds her self eating fast food quite frequently.  Hypertension Reports good compliance on her HCTZ 12.5 mg daily.  Tolerating well. No lightheadedness, dizziness, lower extremity edema  Obesity She used to walk quite frequently but stopped recently. She would like to lose weight and voices that she has thought a lot about it but not taken any action yet.  Her biggest goal is to become more active.  She lives next to her mom used to walk there and wants to begin again.  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  It is due sometime in early March.  She did not mention this when she called to ask for sleep study referral.  She would appreciate prompt scheduling.   ROS: Per HPI  Social Hx: She reports that she quit smoking about 7 months ago. Her smoking use included cigarettes. She has a 3.40 pack-year smoking history. She has never used smokeless tobacco. She reports current alcohol use. She reports that she does not use drugs.   Objective:  Physical Exam: BP 126/90   Temp 98.6 F (37 C) (Oral)   Ht 5\' 3"  (1.6 m)   Wt 272 lb (123.4 kg)   LMP 10/18/2018 (Approximate)   BMI 48.18 kg/m   Gen: NAD, resting comfortably, obese Pulm: NWOB Skin: warm, dry Neuro: grossly normal, moves all extremities Psych: Normal affect and thought content  Results for  orders placed or performed in visit on 10/25/18 (from the past 72 hour(s))  HgB A1c     Status: None   Collection Time: 10/25/18  3:58 PM  Result Value Ref Range   Hemoglobin A1C     HbA1c POC (<> result, manual entry)     HbA1c, POC (prediabetic range)     HbA1c, POC (controlled diabetic range) 6.0 0.0 - 7.0 %     Assessment/Plan:  Type 2 diabetes mellitus without complication, without long-term current use of insulin (HCC) At goal.  Long discussion today regarding dietary and lifestyle changes.  Continue metformin 1000 mg twice daily  Essential hypertension Stable and at goal.  Continue HCTZ 12.5 mg daily  Morbid obesity (HCC) Patient voices high motivation for increased physical activity.  Goal set for walking 5 times a week and follow-up in 1 month.  Patient is pre-contemplative regarding dietary changes as her schedule makes this difficult.  Loud snoring Sleep study ordered for DOT physical.  The MA attempted to call over for prompt scheduling however earliest appointment was March 17.  Patient is also on a waiting list for this.  Given a letter today for work.   Leland Her, DO PGY-3, [facility] 10/25/2018 3:52 PM

## 2018-10-25 NOTE — Assessment & Plan Note (Signed)
Stable and at goal.  Continue HCTZ 12.5 mg daily

## 2018-10-25 NOTE — Patient Instructions (Addendum)
It was good to see you today!  Goal [place] 4 laps each time.   Please check-out at the front desk before leaving the clinic. Make an appointment in  1 month.   We will help you schedule your sleep study.      Take care,  Dr. Leland Her, DO [facility]

## 2018-10-25 NOTE — Assessment & Plan Note (Signed)
Sleep study ordered for DOT physical.  The MA attempted to call over for prompt scheduling however earliest appointment was March 17.  Patient is also on a waiting list for this.  Given a letter today for work.

## 2018-10-25 NOTE — Assessment & Plan Note (Signed)
Patient voices high motivation for increased physical activity.  Goal set for walking 5 times a week and follow-up in 1 month.  Patient is pre-contemplative regarding dietary changes as her schedule makes this difficult.

## 2018-10-25 NOTE — Assessment & Plan Note (Addendum)
At goal.  Long discussion today regarding dietary and lifestyle changes.  Continue metformin 1000 mg twice daily

## 2018-10-27 ENCOUNTER — Telehealth: Payer: Self-pay | Admitting: Physical Therapy

## 2018-10-27 ENCOUNTER — Ambulatory Visit: Payer: Medicaid Other | Admitting: Physical Therapy

## 2018-10-27 NOTE — Telephone Encounter (Signed)
LVM regarding missed appointment today at 8am. 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 I would work to keep her chart open for a 1-2 weeks to see If she can get it switched over. In the event she cannot and would like to avoid self pay charges we can discharge her from PT. Either way to call us and let us know.

## 2018-11-03 ENCOUNTER — Ambulatory Visit: Payer: Self-pay | Attending: Sports Medicine | Admitting: Physical Therapy

## 2018-11-03 ENCOUNTER — Telehealth: Payer: Self-pay | Admitting: Physical Therapy

## 2018-11-03 NOTE — Telephone Encounter (Signed)
LVM regarding her second missed appointment. I noted that I understand she is trying to work on converting her MCD from family planning to regular MCD in order to get PT covered. We can work on trying to keep her episode open for another week or so but if she is unable to get her coverage switched and would like to avoid paying out of pocket we could go ahead and discharge at this time. Noted she has no more scheduled visits and to call us back if she has any other questions.

## 2018-11-15 ENCOUNTER — Other Ambulatory Visit: Payer: Self-pay | Admitting: Family Medicine

## 2018-11-15 ENCOUNTER — Telehealth: Payer: Self-pay

## 2018-11-15 MED ORDER — TRAZODONE HCL 50 MG PO TABS: ORAL_TABLET | ORAL | 0 refills | Status: DC

## 2018-11-15 NOTE — Telephone Encounter (Signed)
LM for patient to call back. Abhishek Levesque,CMA  

## 2018-11-15 NOTE — Telephone Encounter (Signed)
Called in Rx for trazodone 50 mg and instructed to take 1 tab 1 hour before sleep study.  Durward Parcel, DO [facility], PGY-3

## 2018-11-15 NOTE — Telephone Encounter (Signed)
Pt called nurse line stating she has a sleep study scheduled for tomorrow, and she is requesting something to help her sleep. Pt stated she has to be there tomorrow at 8pm.   [facility]   Please let her know when this has been done.

## 2018-11-16 ENCOUNTER — Ambulatory Visit (HOSPITAL_BASED_OUTPATIENT_CLINIC_OR_DEPARTMENT_OTHER): Payer: Medicaid Other | Attending: Family Medicine | Admitting: Internal Medicine

## 2018-11-16 VITALS — Ht 63.0 in | Wt 272.0 lb

## 2018-11-16 DIAGNOSIS — R0683 Snoring: Secondary | ICD-10-CM | POA: Insufficient documentation

## 2018-11-19 ENCOUNTER — Telehealth: Payer: Self-pay | Admitting: Family Medicine

## 2018-11-19 NOTE — Telephone Encounter (Signed)
Called patient to offer a telemedicine encounter instead of in person appointment for 11/24/2018.  Patient agreeable.  Discussed that we would have a telemedicine encounter at her scheduled appointment time at 830 that day.  We will discuss her obesity follow-up as well as follow-up on her sleep study results.

## 2018-11-24 ENCOUNTER — Telehealth (INDEPENDENT_AMBULATORY_CARE_PROVIDER_SITE_OTHER): Payer: Medicaid Other | Admitting: Family Medicine

## 2018-11-24 ENCOUNTER — Telehealth: Payer: Self-pay | Admitting: Family Medicine

## 2018-11-24 ENCOUNTER — Ambulatory Visit: Payer: Medicaid Other | Admitting: Family Medicine

## 2018-11-24 ENCOUNTER — Other Ambulatory Visit: Payer: Self-pay

## 2018-11-24 NOTE — Progress Notes (Signed)
[facility] Telemedicine Visit  Patient consented to have visit conducted via telephone.  Encounter participants: Patient: Dorothy Haynes  Provider: Leland Her  Others (if applicable): none  Chief Complaint: obesity follow up  HPI:  Patient has not been able to exercise due to trying to minimize her risk exposure for COVID by social distancing. She has been eating healthy.  Has been doing meal prepping. Has been grilling food.  She feels these changes have been beneficial for her  24-hr recall  (Up at 7 AM) B ( 8AM)- side salad, honeydew, 3 pieces of grilled chicken, 4 asparagus. water L (1PM)- 6inch sub from subway. [place] meats, cheddar, lettuce, green pepper, salt and pepper, mayo on [place] herb bread. Water Snk ( 3 PM)- the other part which was 6inch sub from subway. [place] meats, cheddar, lettuce, green pepper, salt and pepper, mayo on [place] herb bread. Water D ( 730 PM)-  Side salad, stuffed mushrooms, ice cream sandwich. water Snk ( 11 PM)- jello Typical day? Yes.      ROS: per HPI  Pertinent PMHx: Diabetes, hypertension, obesity  Assessment/Plan:  Morbid obesity (HCC) Doing well some dietary modifications.  Unfortunately is limited in her ability to exercise at this time.  Encourage patient efforts and plan to follow-up in late May or early June for repeat A1c.    Time spent on phone with patient: 8 minutes  Billing: yes  Leland Her, DO PGY-3, [facility] 11/24/2018 11:23 AM

## 2018-11-24 NOTE — Assessment & Plan Note (Signed)
Doing well some dietary modifications.  Unfortunately is limited in her ability to exercise at this time.  Encourage patient efforts and plan to follow-up in late May or early June for repeat A1c.

## 2018-11-24 NOTE — Telephone Encounter (Signed)
Patient called returning Dr.Yoo's phone call. Artist Pais was seeing patient when patient called. Please call patient back.

## 2018-11-24 NOTE — Telephone Encounter (Signed)
Called patient back. See telemedicine encounter

## 2018-11-24 NOTE — Telephone Encounter (Signed)
Called patient. No response. Left message that I attempted to call for telemedicine encounter today as planned and to call back.

## 2018-11-28 DIAGNOSIS — R0683 Snoring: Secondary | ICD-10-CM

## 2018-11-28 NOTE — Procedures (Signed)
Patient Name: Dorothy Haynes, Dorothy Haynes Date: 11/16/2018 Gender: Female D.O.B: 12-05-1985 Age (years): 32 Referring Provider: Jeneen Rinks DO Height (inches): 63 Interpreting Physician: Jetty Duhamel MD, ABSM Weight (lbs): 272 RPSGT: Kriste Basque BMI: 48 MRN: [id number] Neck Size: 17.50  CLINICAL INFORMATION Sleep Study Type: NPSG Indication for sleep study: Hypertension, Morbid Obesity, Snoring Epworth Sleepiness Score: 6  SLEEP STUDY TECHNIQUE As per the AASM Manual for the Scoring of Sleep and Associated Events v2.3 (April 2016) with a hypopnea requiring 4% desaturations.  The channels recorded and monitored were frontal, central and occipital EEG, electrooculogram (EOG), submentalis EMG (chin), nasal and oral airflow, thoracic and abdominal wall motion, anterior tibialis EMG, snore microphone, electrocardiogram, and pulse oximetry.  MEDICATIONS Medications self-administered by patient taken the night of the study : METFORMIN, TRAZODONE  SLEEP ARCHITECTURE The study was initiated at 10:58:07 PM and ended at 5:01:12 AM.  Sleep onset time was 3.5 minutes and the sleep efficiency was 89.6%%. The total sleep time was 325.5 minutes.  Stage REM latency was 58.5 minutes.  The patient spent 12.9%% of the night in stage N1 sleep, 75.1%% in stage N2 sleep, 0.0%% in stage N3 and 12% in REM.  Alpha intrusion was absent.  Supine sleep was 30.41%.  RESPIRATORY PARAMETERS The overall apnea/hypopnea index (AHI) was 9.4 per hour. There were 17 total apneas, including 17 obstructive, 0 central and 0 mixed apneas. There were 34 hypopneas and 3 RERAs.  The AHI during Stage REM sleep was 50.8 per hour.  AHI while supine was 13.3 per hour.  The mean oxygen saturation was 95.8%. The minimum SpO2 during sleep was 85.0%.  loud snoring was noted during this study.  CARDIAC DATA The 2 lead EKG demonstrated sinus rhythm. The mean heart rate was 85.2 beats per minute. Other EKG findings  include: None.  LEG MOVEMENT DATA The total PLMS were 0 with a resulting PLMS index of 0.0. Associated arousal with leg movement index was 0.0 .  IMPRESSIONS - Mild obstructive sleep apnea occurred during this study (AHI = 9.4/h). - No significant central sleep apnea occurred during this study (CAI = 0.0/h). - Mild oxygen desaturation was noted during this study (Min O2 = 85.0%). Mean sat 95.8%. - The patient snored with loud snoring volume. - No cardiac abnormalities were noted during this study. - Clinically significant periodic limb movements did not occur during sleep. No significant associated arousals.  DIAGNOSIS - Obstructive Sleep Apnea (327.23 [G47.33 ICD-10])  RECOMMENDATIONS - Treatment for mild OSA is directed at symptoms, with consideration of co-morbidities and driving safety. Conservative measures might include observation, weight loss and sleep position off back. - Other options, including CPAP, a fitted oral appliance, Sleep medical consultation or ENT evaluation, would be based on clinical judgment - Be careful with alcohol, sedatives and other CNS depressants that may worsen sleep apnea and disrupt normal sleep architecture. - Sleep hygiene should be reviewed to assess factors that may improve sleep quality. - Weight management and regular exercise should be initiated or continued if appropriate.  [Electronically signed] 11/28/2018 12:33 PM  Jetty Duhamel MD, ABSM Diplomate, American Board of Sleep Medicine   NPI: [id number]                          Jetty Duhamel Diplomate, American Board of Sleep Medicine  ELECTRONICALLY SIGNED ON:  11/28/2018, 12:29 PM [facility] PH: [phone]   FX: [phone] ACCREDITED BY THE AMERICAN ACADEMY  OF SLEEP MEDICINE

## 2018-11-29 ENCOUNTER — Encounter: Payer: Self-pay | Admitting: Family Medicine

## 2019-03-07 ENCOUNTER — Other Ambulatory Visit: Payer: Self-pay

## 2019-03-07 ENCOUNTER — Ambulatory Visit (INDEPENDENT_AMBULATORY_CARE_PROVIDER_SITE_OTHER): Payer: Self-pay | Admitting: Family Medicine

## 2019-03-07 ENCOUNTER — Encounter: Payer: Self-pay | Admitting: Family Medicine

## 2019-03-07 VITALS — BP 122/86 | HR 84 | Ht 63.0 in | Wt 266.0 lb

## 2019-03-07 DIAGNOSIS — E119 Type 2 diabetes mellitus without complications: Secondary | ICD-10-CM

## 2019-03-07 DIAGNOSIS — R0989 Other specified symptoms and signs involving the circulatory and respiratory systems: Secondary | ICD-10-CM | POA: Insufficient documentation

## 2019-03-07 LAB — POCT GLYCOSYLATED HEMOGLOBIN (HGB A1C): HbA1c, POC (controlled diabetic range): 6.2 % (ref 0.0–7.0)

## 2019-03-07 NOTE — Progress Notes (Addendum)
    Subjective:  Dorothy Haynes is a 33 y.o. female who presents to the [facility] today for a diabetes follow up.   HPI: Diabetes She reports that she has consistently been taking her metformin at home.  With regard to her diet, she reports that she continues to have a poor understanding of diabetic nutrition continues to eat a significant amount of fast food and carb heavy foods.  She is aware that carbohydrates will contribute to poorly controlled diabetes and further weight gain.  With regard to exercise, she reports that she remembers the conversation from last time regarding the importance of exercise but has not been able to find a good exercise partner has difficulty being motivated to get outside to exercise.  Additionally, she feels that her neighborhood is not a safe place for exercise outside.  She notes that she would like to find a partner, perhaps her cousin, to motivate her to exercise regularly.  She is not currently experiencing any polyuria or polydipsia.  Axillary lymph node She reports that she is noticed a right axillary lymph node for the past 3 weeks.  It is not tender although she was concerned that it might need to be addressed.  Chief Complaint noted Review of Symptoms - see HPI PMH - diabetes, family history of diabetes.    Objective:  Physical Exam: BP 122/86   Pulse 84   Ht '5\' 3"'$  (1.6 m)   Wt 266 lb (120.7 kg)   LMP 02/21/2019   SpO2 98%   BMI 47.12 kg/m    Gen: Morbidly obese female.  NAD, resting comfortably Pulm: Breathing comfortably on room air.  No wheezing appreciated. Breast exam: right breast without evidence of folliculitis or abscesses.  No mass identified by palpation.  Right axillary mass noted roughly 5 mm across, nontender to palpation.  No evident sinus tracts, drainage, erythema. Skin: warm, dry Neuro: grossly normal, moves all extremities Psych: Normal affect and thought content  Results for orders placed or performed in visit on 03/07/19  (from the past 72 hour(s))  HgB A1c     Status: None   Collection Time: 03/07/19 10:15 AM  Result Value Ref Range   Hemoglobin A1C     HbA1c POC (<> result, manual entry)     HbA1c, POC (prediabetic range)     HbA1c, POC (controlled diabetic range) 6.2 0.0 - 7.0 %     Assessment/Plan:  Type 2 diabetes mellitus without complication, without long-term current use of insulin (HCC) -Continue metformin 1000 mg twice daily -Follow-up be met -referral placed to diabetic education -Walk with a budy/cousin for 30 minutes today, 5 days a week  Lymph node symptom No evidence of active infection or breast mass at this time. -Follow-up in 4 weeks to assess growth or diminution. -consider breast U/S if no lymph node change at follow up

## 2019-03-07 NOTE — Assessment & Plan Note (Addendum)
-  Continue metformin 1000 mg twice daily -Follow-up be met -referral placed to diabetic education -Walk with a budy/cousin for 30 minutes today, 5 days a week

## 2019-03-07 NOTE — Patient Instructions (Signed)
It was great to see you today!  I'm glad we had time to discuss the importance of good diabetes management.  You will get a call about a visit regarding diabetes nutrition.    Regarding the lump under your arm.  Let's keep an eye on it for now.  Come back in 4 weeks to reassess. We may move forward with imaging at that time.  We'll call you if there is anything concerning on your bloodwork today.

## 2019-03-07 NOTE — Assessment & Plan Note (Addendum)
No evidence of active infection or breast mass at this time. -Follow-up in 4 weeks to assess growth or diminution. -consider breast U/S if no lymph node change at follow up

## 2019-03-08 ENCOUNTER — Encounter: Payer: Self-pay | Admitting: Family Medicine

## 2019-03-08 LAB — BASIC METABOLIC PANEL
BUN/Creatinine Ratio: 16 (ref 9–23)
BUN: 10 mg/dL (ref 6–20)
CO2: 22 mmol/L (ref 20–29)
Calcium: 9.3 mg/dL (ref 8.7–10.2)
Chloride: 98 mmol/L (ref 96–106)
Creatinine, Ser: 0.62 mg/dL (ref 0.57–1.00)
GFR calc Af Amer: 136 mL/min/{1.73_m2} (ref >59)
GFR calc non Af Amer: 118 mL/min/{1.73_m2} (ref >59)
Glucose: 109 mg/dL — ABNORMAL HIGH (ref 65–99)
Potassium: 4 mmol/L (ref 3.5–5.2)
Sodium: 137 mmol/L (ref 134–144)

## 2019-03-25 ENCOUNTER — Other Ambulatory Visit: Payer: Self-pay

## 2019-03-25 DIAGNOSIS — I10 Essential (primary) hypertension: Secondary | ICD-10-CM

## 2019-03-25 MED ORDER — HYDROCHLOROTHIAZIDE 12.5 MG PO TABS: 12.5000 mg | ORAL_TABLET | Freq: Every day | ORAL | 3 refills | Status: DC

## 2019-05-12 ENCOUNTER — Ambulatory Visit: Payer: Self-pay | Admitting: Family Medicine

## 2019-05-12 ENCOUNTER — Other Ambulatory Visit: Payer: Self-pay

## 2019-05-12 DIAGNOSIS — M7989 Other specified soft tissue disorders: Secondary | ICD-10-CM

## 2019-05-12 MED ORDER — CEPHALEXIN 500 MG PO CAPS: 500.0000 mg | ORAL_CAPSULE | Freq: Four times a day (QID) | ORAL | 0 refills | Status: AC

## 2019-05-12 NOTE — Progress Notes (Signed)
     Subjective: Chief Complaint  Patient presents with  . Insect Bite   HPI: Dorothy Haynes is a 33 y.o. presenting to clinic today to discuss the following:  [place] Patient reports going outside and "must have gotten bit by a bug" as she began developing itchy, swelling, and had mild pain that made her foot swell up. Her pain has resolved and the swelling has stopped and remained in her foot. Her foot is slightly erythematous.She has no calf pain, she is able to ambulate without issue. No fevers, chills, nausea, vomiting, or abdominal pain. No diarrhea.   ROS noted in HPI.    Social History   Tobacco Use  Smoking Status Former Smoker  . Packs/day: 0.20  . Years: 17.00  . Pack years: 3.40  . Types: Cigarettes  . Quit date: 03/01/2018  . Years since quitting: 1.1  Smokeless Tobacco Never Used    Objective: BP (!) 130/98   Pulse 90   SpO2 99%  Vitals and nursing notes reviewed  Physical Exam Gen: Alert and Oriented x 3, NAD CV: RRR, no murmurs, normal S1, S2 split Resp: CTAB, no wheezing, rales, or rhonchi, comfortable work of breathing Ext: no clubbing, cyanosis, non-pitting edema of the left foot, palpable dorsalis pedis pulse, erythema of the dorsum of the left foot. Skin: warm, dry, intact, no rashes  Assessment/Plan:  Foot swelling Inflammatory response to a bug bite vs cellulitis. Does not appear to be cellulitis and with it no longer spreading or hurting I think it makes this a local inflammatory reaction much more likely.  - Benadryl at night time for itching - I did send in Cephalexin 500mg  QID for 7 days in the even this does not get better over the weekend. - Patient expressed agreement and understanding to give it a few days and if no improvement pick up the antibiotic and take it as prescribed.   PATIENT EDUCATION PROVIDED: See AVS    Diagnosis and plan along with any newly prescribed medication(s) were discussed in detail with this patient today.  The patient verbalized understanding and agreed with the plan. Patient advised if symptoms worsen return to clinic or ER.   Meds ordered this encounter  Medications  . cephALEXin (KEFLEX) 500 MG capsule    Sig: Take 1 capsule (500 mg total) by mouth 4 (four) times daily for 5 days.    Dispense:  20 capsule    Refill:  0     Harolyn Rutherford, DO 05/12/2019, 3:53 PM PGY-3 [place]

## 2019-05-12 NOTE — Patient Instructions (Signed)
It was great to meet you today! Thank you for letting me participate in your care!  Today, we discussed your swollen right foot. I think it is a local inflammatory response to a bite of some kind. However, given it is still swollen, red, and not getting much better I have sent in an antibiotic to the pharmacy. I would advise waiting until Friday afternoon to monitor your symptoms and if no improvement with ice and elevation and benadryl then pick up the antibiotic and take it as prescribed.  Be well, Harolyn Rutherford, DO PGY-3, [facility]

## 2019-05-17 DIAGNOSIS — M7989 Other specified soft tissue disorders: Secondary | ICD-10-CM | POA: Insufficient documentation

## 2019-05-17 NOTE — Assessment & Plan Note (Signed)
Inflammatory response to a bug bite vs cellulitis. Does not appear to be cellulitis and with it no longer spreading or hurting I think it makes this a local inflammatory reaction much more likely.  - Benadryl at night time for itching - I did send in Cephalexin 500mg  QID for 7 days in the even this does not get better over the weekend. - Patient expressed agreement and understanding to give it a few days and if no improvement pick up the antibiotic and take it as prescribed.

## 2019-06-02 ENCOUNTER — Encounter: Payer: Self-pay | Admitting: Family Medicine

## 2019-06-02 ENCOUNTER — Ambulatory Visit (HOSPITAL_COMMUNITY)
Admission: RE | Admit: 2019-06-02 | Discharge: 2019-06-02 | Disposition: A | Discharge status: Home / Self Care | Payer: Medicaid Other | Source: Ambulatory Visit | Attending: Family Medicine | Admitting: Family Medicine

## 2019-06-02 ENCOUNTER — Other Ambulatory Visit: Payer: Self-pay

## 2019-06-02 ENCOUNTER — Ambulatory Visit: Payer: Self-pay | Admitting: Family Medicine

## 2019-06-02 VITALS — BP 130/80 | HR 87 | Ht 63.0 in | Wt 268.5 lb

## 2019-06-02 DIAGNOSIS — M545 Low back pain, unspecified: Secondary | ICD-10-CM

## 2019-06-02 DIAGNOSIS — S0300XA Dislocation of jaw, unspecified side, initial encounter: Secondary | ICD-10-CM | POA: Insufficient documentation

## 2019-06-02 DIAGNOSIS — R079 Chest pain, unspecified: Secondary | ICD-10-CM | POA: Diagnosis not present

## 2019-06-02 DIAGNOSIS — M722 Plantar fascial fibromatosis: Secondary | ICD-10-CM | POA: Insufficient documentation

## 2019-06-02 DIAGNOSIS — M62830 Muscle spasm of back: Secondary | ICD-10-CM

## 2019-06-02 DIAGNOSIS — M79602 Pain in left arm: Secondary | ICD-10-CM | POA: Insufficient documentation

## 2019-06-02 HISTORY — DX: Plantar fascial fibromatosis: M72.2

## 2019-06-02 HISTORY — DX: Dislocation of jaw, unspecified side, initial encounter: S03.00XA

## 2019-06-02 MED ORDER — BACLOFEN 10 MG PO TABS: 10.0000 mg | ORAL_TABLET | Freq: Three times a day (TID) | ORAL | 0 refills | Status: DC

## 2019-06-02 NOTE — Assessment & Plan Note (Signed)
Patient reports tingling numbness and pressure in left arm, likely referred pain possibly from chest.  Patient has a number of risk factors including morbid obesity, and diabetes. -EKG revealed normal sinus rhythm, very reassuring -Will obtain risk stratification labs today including lipid panel -Gave patient strict return precautions including seeking immediate medical attention if pain does not relieved within a few minutes, she has difficulty breathing, or feels faint/dizzy. -Follow-up 2 weeks

## 2019-06-02 NOTE — Assessment & Plan Note (Signed)
Patient has classic TMJ discomfort.  Recommended ibuprofen for pain, no evidence of infection.  Could be due to teeth grinding at night, recommended she reach out to her dentist if she has further concerns or wishes to have therapy.

## 2019-06-02 NOTE — Assessment & Plan Note (Signed)
Due to history and tenderness to palpation, and not of musculature felt above right SI joint, believe this patient has a spasm of her quadratus lumborum muscle.  -Recommended exercises with a foam roller to help release muscle spasm -Gave handout of exercises to increase core strength and support back to prevent muscle spasm -Baclofen for muscle spasm, also can use ibuprofen or Tylenol 2 pills per every 8 hours use sparingly -Follow-up in 2 weeks

## 2019-06-02 NOTE — Patient Instructions (Signed)
It was a pleasure taking care of you today!  Today we talked about:  1. Plantar fasciitis- your foot pain is likely due to inflamation of the tendons and tissues of the bottom of your foot.  You can help this by taking ibuprofen or Tylenol 2 pills in the morning.  You can also freeze a water bottle and use that throughout your foot, or use a tennis ball to help rule out your foot.  I do not recommend stopping exercise, but may be scaling back distance until you feel better.  2.  Your back pain is likely due to to a muscle spasm over your right pelvis.  You can help take care of this with a deep massage, warm baths, and using a foam roller to rule out the knot.  3.  Temporomandibular joint (TMJ) pain: Is most likely the cause of your jaw pain.  And also take ibuprofen 2 pills, and I also gave you baclofen, for muscle pain.  You do not have any signs of an infection, I recommend that you speak to your dentist for further treatment if you are interested.  4.  Left arm pain:  If you have nausea, dizziness, or sweating with very bad left arm pain or chest pain please seek immediate medical attention.  Getting some labs today to check on her health.  I recommend that you follow-up in 2 weeks.  Nonspecific Chest Pain Chest pain can be caused by many different conditions. Some causes of chest pain can be life-threatening. These will require treatment right away. Serious causes of chest pain include:  Heart attack.  A tear in the body's main blood vessel.  Redness and swelling (inflammation) around your heart.  Blood clot in your lungs. Other causes of chest pain may not be so serious. These include:  Heartburn.  Anxiety or stress.  Damage to bones or muscles in your chest.  Lung infections. Chest pain can feel like:  Pain or discomfort in your chest.  Crushing, pressure, aching, or squeezing pain.  Burning or tingling.  Dull or sharp pain that is worse when you move, cough, or take a  deep breath.  Pain or discomfort that is also felt in your back, neck, jaw, shoulder, or arm, or pain that spreads to any of these areas. It is hard to know whether your pain is caused by something that is serious or something that is not so serious. So it is important to see your doctor right away if you have chest pain. Follow these instructions at home: Medicines  Take over-the-counter and prescription medicines only as told by your doctor.  If you were prescribed an antibiotic medicine, take it as told by your doctor. Do not stop taking the antibiotic even if you start to feel better. Lifestyle   Rest as told by your doctor.  Do not use any products that contain nicotine or tobacco, such as cigarettes, e-cigarettes, and chewing tobacco. If you need help quitting, ask your doctor.  Do not drink alcohol.  Make lifestyle changes as told by your doctor. These may include: ? Getting regular exercise. Ask your doctor what activities are safe for you. ? Eating a heart-healthy diet. A diet and nutrition specialist (dietitian) can help you to learn healthy eating options. ? Staying at a healthy weight. ? Treating diabetes or high blood pressure, if needed. ? Lowering your stress. Activities such as yoga and relaxation techniques can help. General instructions  Pay attention to any changes in your  symptoms. Tell your doctor about them or any new symptoms.  Avoid any activities that cause chest pain.  Keep all follow-up visits as told by your doctor. This is important. You may need more testing if your chest pain does not go away. Contact a doctor if:  Your chest pain does not go away.  You feel depressed.  You have a fever. Get help right away if:  Your chest pain is worse.  You have a cough that gets worse, or you cough up blood.  You have very bad (severe) pain in your belly (abdomen).  You pass out (faint).  You have either of these for no clear reason: ? Sudden chest  discomfort. ? Sudden discomfort in your arms, back, neck, or jaw.  You have shortness of breath at any time.  You suddenly start to sweat, or your skin gets clammy.  You feel sick to your stomach (nauseous).  You throw up (vomit).  You suddenly feel lightheaded or dizzy.  You feel very weak or tired.  Your heart starts to beat fast, or it feels like it is skipping beats. These symptoms may be an emergency. Do not wait to see if the symptoms will go away. Get medical help right away. Call your local emergency services (911 in the [place] Do not drive yourself to the hospital. Summary  Chest pain can be caused by many different conditions. The cause may be serious and need treatment right away. If you have chest pain, see your doctor right away.  Follow your doctor's instructions for taking medicines and making lifestyle changes.  Keep all follow-up visits as told by your doctor. This includes visits for any further testing if your chest pain does not go away.  Be sure to know the signs that show that your condition has become worse. Get help right away if you have these symptoms. This information is not intended to replace advice given to you by your health care provider. Make sure you discuss any questions you have with your health care provider. Document Released: 02/04/2008 Document Revised: 02/18/2018 Document Reviewed: 02/18/2018 Elsevier Patient Education  2020 [employer].

## 2019-06-02 NOTE — Assessment & Plan Note (Signed)
History exam consistent with plantar fasciitis. -Recommended patient take ibuprofen or Tylenol 2 pills in the morning for pain, use sparingly -Recommended freezing water bottles and using this roll out her foot in the morning -She can also use a tennis ball to roll her foot out -Handout given on exercises to improve plantar fasciitis -Recommended decreasing distance of walking but not walking itself for exercise -Follow-up 2 weeks

## 2019-06-02 NOTE — Progress Notes (Signed)
Subjective:    Patient ID: Dorothy Haynes, female    DOB: 04/04/1986, 33 y.o.   MRN: [id number]   CC: pain in feet, pain in R lower back, L arm pain, R jaw pain  HPI: [name] presents today for follow-up and for new concerns.  Foot pain: Patient states that her staying in the morning she has burning pain at the bottom of both of her feet, right worse than left.  Throughout the day her pain goes away with more use.  She has not noticed any swelling of her feet, or wounds.  Pain is been continuing for 1 month.  Right low back pain: Patient has noticed that she has had some tenderness of her right lower back, she notices it more when she leans against a chair or something touches her back.  This is been occurring for about 2 weeks.  She has no problem bending down picking up things or lifting, no radiation of pain down the leg.  She has no incontinence or saddle anesthesia.  Right jaw pain: Patient has noticed that her right jaw clicks and is sore, she has had to chew the left side of her mouth.  She has no history of oral infections.  She denies headaches, change in hearing, tinnitus, lacrimation, runny nose, or headaches.  She has noticed this pain for about 3 weeks.  Left arm pain: Patient has noticed a tingling and pressure in her left arm that only occurs when she is walking.  This discomfort is relieved with rest.  She has no radiation of pain up her neck or jaw, no nausea vomiting, no diaphoresis.  She does not report any cardiac death in her family.  She has noticed that recently as she is instituted a walking regimen in order to lose weight and treat her diabetes.  Smoking status reviewed   ROS: pertinent noted in the HPI   Past medical history, surgical, family, and social history reviewed and updated in the EMR as appropriate.  Objective:  BP 130/80   Pulse 87   Ht 5\' 3"  (1.6 m)   Wt 268 lb 8 oz (121.8 kg)   LMP 05/24/2019   SpO2 98%   BMI 47.56 kg/m   Vitals and  nursing note reviewed  General: NAD, pleasant, able to participate in exam HEENT: No periauricular swelling, warmth or tenderness, posterior movement palpated at TMJ when mandible opened and closed; no erythema, swelling, of gum line or cheeks, teeth in good state minimal plaque and cavities noted Cardiac: RRR, S1 S2 present. normal heart sounds, no murmurs. Respiratory: CTAB, normal effort, No wheezes, rales or rhonchi Extremities: no edema or cyanosis. MSK: Right lower back- patient has tenderness with palpation over right SI joint, able to palpate firm tissue over lying joint, straight leg raise negative, Fader-faber WNL.  LUE: Motion intact, strength 5 out of 5, sensation intact, DTRs intact.  No swelling, erythema, warmth noted.  Hawkins sign negative, Spurling's test negative. Skin: warm and dry, no rashes noted Neuro: alert, no obvious focal deficits Psych: Normal affect and mood Diabetic Foot Exam - Simple   Simple Foot Form Diabetic Foot exam was performed with the following findings: Yes 06/02/2019 10:08 AM  Visual Inspection No deformities, no ulcerations, no other skin breakdown bilaterally: Yes Sensation Testing Intact to touch and monofilament testing bilaterally: Yes Pulse Check Posterior Tibialis and Dorsalis pulse intact bilaterally: Yes Comments Normal exam.    EKG: NSR  Assessment & Plan:  [name]  is a 33 year old woman with past medical history of diabetes and morbid obesity presenting today for multiple musculoskeletal complaints.  Plantar fasciitis, bilateral History exam consistent with plantar fasciitis. -Recommended patient take ibuprofen or Tylenol 2 pills in the morning for pain, use sparingly -Recommended freezing water bottles and using this roll out her foot in the morning -She can also use a tennis ball to roll her foot out -Handout given on exercises to improve plantar fasciitis -Recommended decreasing distance of walking but not walking itself  for exercise -Follow-up 2 weeks   Left arm pain Patient reports tingling numbness and pressure in left arm, likely referred pain possibly from chest.  Patient has a number of risk factors including morbid obesity, and diabetes. -EKG revealed normal sinus rhythm, very reassuring -Will obtain risk stratification labs today including lipid panel -Gave patient strict return precautions including seeking immediate medical attention if pain does not relieved within a few minutes, she has difficulty breathing, or feels faint/dizzy. -Follow-up 2 weeks  TMJ (dislocation of temporomandibular joint), initial encounter Patient has classic TMJ discomfort.  Recommended ibuprofen for pain, no evidence of infection.  Could be due to teeth grinding at night, recommended she reach out to her dentist if she has further concerns or wishes to have therapy.  Spasm of muscle of lower back Due to history and tenderness to palpation, and not of musculature felt above right SI joint, believe this patient has a spasm of her quadratus lumborum muscle.  -Recommended exercises with a foam roller to help release muscle spasm -Gave handout of exercises to increase core strength and support back to prevent muscle spasm -Baclofen for muscle spasm, also can use ibuprofen or Tylenol 2 pills per every 8 hours use sparingly -Follow-up in 2 weeks  Shirlean Mylar, MD [facility] PGY-1

## 2019-06-03 LAB — CBC WITH DIFFERENTIAL/PLATELET
Basophils Absolute: 0.1 10*3/uL (ref 0.0–0.2)
Basos: 1 %
EOS (ABSOLUTE): 0.2 10*3/uL (ref 0.0–0.4)
Eos: 1 %
Hematocrit: 33.4 % — ABNORMAL LOW (ref 34.0–46.6)
Hemoglobin: 10.8 g/dL — ABNORMAL LOW (ref 11.1–15.9)
Immature Grans (Abs): 0 10*3/uL (ref 0.0–0.1)
Immature Granulocytes: 0 %
Lymphocytes Absolute: 4.5 10*3/uL — ABNORMAL HIGH (ref 0.7–3.1)
Lymphs: 35 %
MCH: 26.9 pg (ref 26.6–33.0)
MCHC: 32.3 g/dL (ref 31.5–35.7)
MCV: 83 fL (ref 79–97)
Monocytes Absolute: 0.6 10*3/uL (ref 0.1–0.9)
Monocytes: 5 %
Neutrophils Absolute: 7.6 10*3/uL — ABNORMAL HIGH (ref 1.4–7.0)
Neutrophils: 58 %
Platelets: 487 10*3/uL — ABNORMAL HIGH (ref 150–450)
RBC: 4.02 x10E6/uL (ref 3.77–5.28)
RDW: 13.7 % (ref 11.7–15.4)
WBC: 12.9 10*3/uL — ABNORMAL HIGH (ref 3.4–10.8)

## 2019-06-03 LAB — BASIC METABOLIC PANEL
BUN/Creatinine Ratio: 23 (ref 9–23)
BUN: 15 mg/dL (ref 6–20)
CO2: 24 mmol/L (ref 20–29)
Calcium: 9.5 mg/dL (ref 8.7–10.2)
Chloride: 98 mmol/L (ref 96–106)
Creatinine, Ser: 0.64 mg/dL (ref 0.57–1.00)
GFR calc Af Amer: 134 mL/min/{1.73_m2} (ref >59)
GFR calc non Af Amer: 116 mL/min/{1.73_m2} (ref >59)
Glucose: 106 mg/dL — ABNORMAL HIGH (ref 65–99)
Potassium: 4.1 mmol/L (ref 3.5–5.2)
Sodium: 138 mmol/L (ref 134–144)

## 2019-06-03 LAB — LIPID PANEL
Chol/HDL Ratio: 3.7 ratio (ref 0.0–4.4)
Cholesterol, Total: 170 mg/dL (ref 100–199)
HDL: 46 mg/dL (ref >39)
LDL Chol Calc (NIH): 86 mg/dL (ref 0–99)
Triglycerides: 226 mg/dL — ABNORMAL HIGH (ref 0–149)
VLDL Cholesterol Cal: 38 mg/dL (ref 5–40)

## 2019-06-20 ENCOUNTER — Other Ambulatory Visit: Payer: Self-pay

## 2019-06-20 ENCOUNTER — Telehealth: Payer: Self-pay | Admitting: *Deleted

## 2019-06-20 ENCOUNTER — Encounter: Payer: Self-pay | Admitting: Family Medicine

## 2019-06-20 ENCOUNTER — Ambulatory Visit (INDEPENDENT_AMBULATORY_CARE_PROVIDER_SITE_OTHER): Payer: Self-pay | Admitting: Family Medicine

## 2019-06-20 VITALS — BP 124/78 | HR 83 | Wt 270.0 lb

## 2019-06-20 DIAGNOSIS — I208 Other forms of angina pectoris: Secondary | ICD-10-CM

## 2019-06-20 DIAGNOSIS — E119 Type 2 diabetes mellitus without complications: Secondary | ICD-10-CM

## 2019-06-20 DIAGNOSIS — I10 Essential (primary) hypertension: Secondary | ICD-10-CM

## 2019-06-20 DIAGNOSIS — I2089 Other forms of angina pectoris: Secondary | ICD-10-CM

## 2019-06-20 DIAGNOSIS — D649 Anemia, unspecified: Secondary | ICD-10-CM

## 2019-06-20 DIAGNOSIS — N921 Excessive and frequent menstruation with irregular cycle: Secondary | ICD-10-CM

## 2019-06-20 DIAGNOSIS — S0300XA Dislocation of jaw, unspecified side, initial encounter: Secondary | ICD-10-CM

## 2019-06-20 DIAGNOSIS — M722 Plantar fascial fibromatosis: Secondary | ICD-10-CM

## 2019-06-20 LAB — POCT GLYCOSYLATED HEMOGLOBIN (HGB A1C): HbA1c, POC (controlled diabetic range): 5.9 % (ref 0.0–7.0)

## 2019-06-20 MED ORDER — ROSUVASTATIN CALCIUM 10 MG PO TABS: 10.0000 mg | ORAL_TABLET | Freq: Every day | ORAL | 3 refills | Status: DC

## 2019-06-20 NOTE — Progress Notes (Signed)
Subjective:    Patient ID: Dorothy Haynes, female    DOB: Dec 12, 1985, 33 y.o.   MRN: [id number]   CC: Follow-up for left arm/chest pain, previous somatic complaints  HPI: [name] presents today for follow-up.  She states that she still has her left arm pain which only occurs with walking, and goes away with rest.  The pain is a dull ache, she denies nausea/vomiting, diaphoresis, chest pressure, shortness of breath.  She has a walking regimen every evening with her cousin, and feels her left arm pain a few minutes into the walk and has to sit down and rest before she can continue the walking regimen. She denies family h/o MI and sudden cardiac death.  She also reports heavy menses that is very irregular.  Sometimes she cannot have her period for months, sometimes it that her period will come and lasts from 2 weeks up to a month, with heavy bleeding and cramping.  She has previously used Depo-Provera for birth control, since her son was born (he is now 50 years old).  She stopped Depo about 2 years ago.  She said that she had a good response, with no bleeding on Depo-Provera in the past.  She states that she is not currently sexually active and does not want to have more children; she wishes to have longer acting birth control.  Her plantar fasciitis is improving.  She has decreased pain in her feet in the morning, she states that she has been using a frozen water bottle to roll them out and that this has helped.  Her low back pain has resolved since last visit.  She still has TMJ pain, has an appointment on 10/27 with her dentist.  Smoking status reviewed   ROS: pertinent noted in the HPI   Past medical history, surgical, family, and social history reviewed and updated in the EMR as appropriate.  Objective:  BP 124/78   Pulse 83   Wt 270 lb (122.5 kg)   LMP 06/16/2019 (Exact Date)   SpO2 97%   BMI 47.83 kg/m   Vitals and nursing note reviewed  General: NAD, pleasant,  woman with obesity, able to participate in exam Cardiac: RRR, S1 S2 present. normal heart sounds, no murmurs. Respiratory: CTAB, normal effort, No wheezes, rales or rhonchi Extremities: no edema or cyanosis. LUE: No erythema, edema, warmth, full ROM, sensation intact, strength 5/5, no TTP at sub acromion or AC joint, DTR intact, Hawkins sign negative, Spurling test negative, painful arc negative. Skin: warm and dry, no rashes noted Neuro: alert, no obvious focal deficits Psych: Normal affect and mood   Assessment & Plan:  Ms. Morris is a 33 yo woman with exertional angina, anemia 2/2 menorrhagia, hypertriglyceridemia, DMT2, and HTN.  Exertional angina (HCC) Pt has L arm pain only when walking that resolves with rest, has DMT2. EKG NSR at last visit. Risk stratification labs obtained since last visit: Lipid panel reveals mild hypertriglyceridemia, A1c at goal with metformin. CBC reveals anemia, pt has menorrhagia, hgb is 10.8, which is not low enough to typically indicate demand ischemia related to anemia.  - ASCVD risk 3.9%, will start moderate intensity statin due to DMT2 - Referral for cardiac stress test - strict return precautions for chest pain, SOB, diaphoresis, GERD sx, n/v given to pt to seek immediate medical care  Menorrhagia with irregular cycle Pt has anemia with recent h/o irregular, heavy bleeding. She is currently on her period. She has considered hormonal IUDs in  the past, and would like one. She is not currently sexually active, and has no intention of having more children. Pt could possibly have PCOS, and is working on losing weight with walking regimen, metformin, and improved diet. Because she is not interested in having more children, has anemia, and would like to have menorrhagia treated, she would like mirena placed and PCOS work up is not necessary at this time.  - Pt to schedule appt for mirena IUD placement  Anemia Pt has MCV in normal range, Hgb 10.8, decreased from  11.6 in 2019. Pt has active menorrhagia. - Obtain Fe panel - Pt to have mirena IUD placed  TMJ (dislocation of temporomandibular joint), initial encounter Pain is still present, she has appt with dentist on 06/28/19  Essential hypertension Stable and at goal. Continue HCTZ 12.5 mg qd.  Plantar fasciitis, bilateral Improving with rolling foot on frozen water bottle in the morning.    Shirlean Mylar, MD [facility] PGY-1

## 2019-06-20 NOTE — Telephone Encounter (Signed)
Pt calls because she only has family planning medicaid.  This does not pay for medications.  The Med sent in will cost her $114 and she needs a cheaper option. Christen Bame, CMA

## 2019-06-20 NOTE — Patient Instructions (Signed)
It was a pleasure seeing you today!  1. I have referred you to cardiology for a stress test to evaluate your left arm pain which may be chest pain. You should receive a call from them to schedule an appointment.  2. I have started you on a statin, take 1 pill every day, this is to prevent events like heart attacks especially in people with diabetes.  3. I am evaluating your anemia (low blood counts) with iron studies. I will let you know the results, especially if they are abnormal we will likely start iron supplementation.  4. Schedule an appointment for an IUD insertion at the front desk.   Be Well!   High Triglycerides Eating Plan Triglycerides are a type of fat in the blood. High levels of triglycerides can increase your risk of heart disease and stroke. If your triglyceride levels are high, choosing the right foods can help lower your triglycerides and keep your heart healthy. Work with your health care provider or a diet and nutrition specialist (dietitian) to develop an eating plan that is right for you. What are tips for following this plan? General guidelines   Lose weight, if you are overweight. For most people, losing 5-10 lbs (2-5 kg) helps lower triglyceride levels. A weight-loss plan may include. ? 30 minutes of exercise at least 5 days a week. ? Reducing the amount of calories, sugar, and fat you eat.  Eat a wide variety of fresh fruits, vegetables, and whole grains. These foods are high in fiber.  Eat foods that contain healthy fats, such as fatty fish, nuts, seeds, and olive oil.  Avoid foods that are high in added sugar, added salt (sodium), saturated fat, and trans fat.  Avoid low-fiber, refined carbohydrates such as white bread, crackers, noodles, and white rice.  Avoid foods with partially hydrogenated oils (trans fats), such as fried foods or stick margarine.  Limit alcohol intake to no more than 1 drink a day for nonpregnant women and 2 drinks a day for men.  One drink equals 12 oz of beer, 5 oz of wine, or 1 oz of hard liquor. Your health care provider may recommend that you drink less depending on your overall health. Reading food labels  Check food labels for the amount of saturated fat. Choose foods with no or very little saturated fat.  Check food labels for the amount of trans fat. Choose foods with no trans fat.  Check food labels for the amount of cholesterol. Choose foods low in cholesterol. Ask your dietitian how much cholesterol you should have each day.  Check food labels for the amount of sodium. Choose foods with less than 140 milligrams (mg) per serving. Shopping  Buy dairy products labeled as nonfat (skim) or low-fat (1%).  Avoid buying processed or prepackaged foods. These are often high in added sugar, sodium, and fat. Cooking  Choose healthy fats when cooking, such as olive oil or canola oil.  Cook foods using lower fat methods, such as baking, broiling, boiling, or grilling.  Make your own sauces, dressings, and marinades when possible, instead of buying them. Store-bought sauces, dressings, and marinades are often high in sodium and sugar. Meal planning  Eat more home-cooked food and less restaurant, buffet, and fast food.  Eat fatty fish at least 2 times each week. Examples of fatty fish include salmon, trout, mackerel, tuna, and herring.  If you eat whole eggs, do not eat more than 3 egg yolks per week. What foods are recommended? The  items listed may not be a complete list. Talk with your dietitian about what dietary choices are best for you. Grains Whole wheat or whole grain breads, crackers, cereals, and pasta. Unsweetened oatmeal. Bulgur. Barley. Quinoa. Brown rice. Whole wheat flour tortillas. Vegetables Fresh or frozen vegetables. Low-sodium canned vegetables. Fruits All fresh, canned (in natural juice), or frozen fruits. Meats and other protein foods Skinless chicken or [place]. Ground chicken or [place].  Lean cuts of pork, trimmed of fat. Fish and seafood, especially salmon, trout, and herring. Egg whites. Dried beans, peas, or lentils. Unsalted nuts or seeds. Unsalted canned beans. Natural peanut or almond butter. Dairy Low-fat dairy products. Skim or low-fat (1%) milk. Reduced fat (2%) and low-sodium cheese. Low-fat ricotta cheese. Low-fat cottage cheese. Plain, low-fat yogurt. Fats and oils Tub margarine without trans fats. Light or reduced-fat mayonnaise. Light or reduced-fat salad dressings. Avocado. Safflower, olive, sunflower, soybean, and canola oils. What foods are not recommended? The items listed may not be a complete list. Talk with your dietitian about what dietary choices are best for you. Grains White bread. White (regular) pasta. White rice. Cornbread. Bagels. Pastries. Crackers that contain trans fat. Vegetables Creamed or fried vegetables. Vegetables in a cheese sauce. Fruits Sweetened dried fruit. Canned fruit in syrup. Fruit juice. Meats and other protein foods Fatty cuts of meat. Ribs. Chicken wings. Berniece Salines. Sausage. Bologna. Salami. Chitterlings. Fatback. Hot dogs. Bratwurst. Packaged lunch meats. Dairy Whole or reduced-fat (2%) milk. Half-and-half. Cream cheese. Full-fat or sweetened yogurt. Full-fat cheese. Nondairy creamers. Whipped toppings. Processed cheese or cheese spreads. Cheese curds. Beverages Alcohol. Sweetened drinks, such as soda, lemonade, fruit drinks, or punches. Fats and oils Butter. Stick margarine. Lard. Shortening. Ghee. Bacon fat. Tropical oils, such as coconut, palm kernel, or palm oils. Sweets and desserts Corn syrup. Sugars. Honey. Molasses. Candy. Jam and jelly. Syrup. Sweetened cereals. Cookies. Pies. Cakes. Donuts. Muffins. Ice cream. Condiments Store-bought sauces, dressings, and marinades that are high in sugar, such as ketchup and barbecue sauce. Summary  High levels of triglycerides can increase the risk of heart disease and stroke.  Choosing the right foods can help lower your triglycerides.  Eat plenty of fresh fruits, vegetables, and whole grains. Choose low-fat dairy and lean meats. Eat fatty fish at least twice a week.  Avoid processed and prepackaged foods with added sugar, sodium, saturated fat, and trans fat.  If you need suggestions or have questions about what types of food are good for you, talk with your health care provider or a dietitian. This information is not intended to replace advice given to you by your health care provider. Make sure you discuss any questions you have with your health care provider. Document Released: 06/05/2004 Document Revised: 07/31/2017 Document Reviewed: 10/21/2016 Elsevier Patient Education  2020 [employer].

## 2019-06-21 LAB — IRON,TIBC AND FERRITIN PANEL
Ferritin: 66 ng/mL (ref 15–150)
Iron Saturation: 12 % — ABNORMAL LOW (ref 15–55)
Iron: 36 ug/dL (ref 27–159)
Total Iron Binding Capacity: 311 ug/dL (ref 250–450)
UIBC: 275 ug/dL (ref 131–425)

## 2019-06-21 MED ORDER — SIMVASTATIN 20 MG PO TABS: 20.0000 mg | ORAL_TABLET | Freq: Every day | ORAL | 3 refills | Status: DC

## 2019-06-21 NOTE — Telephone Encounter (Signed)
Discussed options with patient and I will send Simvastatin 20 mg to [facility]  on [address], which should cost this patient $9.

## 2019-06-22 DIAGNOSIS — R5381 Other malaise: Secondary | ICD-10-CM

## 2019-06-22 DIAGNOSIS — D649 Anemia, unspecified: Secondary | ICD-10-CM | POA: Insufficient documentation

## 2019-06-22 DIAGNOSIS — I208 Other forms of angina pectoris: Secondary | ICD-10-CM | POA: Insufficient documentation

## 2019-06-22 HISTORY — DX: Other malaise: R53.81

## 2019-06-22 NOTE — Assessment & Plan Note (Signed)
Pain is still present, she has appt with dentist on 06/28/19

## 2019-06-22 NOTE — Assessment & Plan Note (Signed)
Improving with rolling foot on frozen water bottle in the morning.

## 2019-06-22 NOTE — Assessment & Plan Note (Signed)
Stable and at goal. Continue HCTZ 12.5mg qd.  

## 2019-06-22 NOTE — Assessment & Plan Note (Signed)
Pt has MCV in normal range, Hgb 10.8, decreased from 11.6 in 2019. Pt has active menorrhagia. - Obtain Fe panel - Pt to have mirena IUD placed

## 2019-06-22 NOTE — Assessment & Plan Note (Addendum)
Pt has anemia with recent h/o irregular, heavy bleeding. She is currently on her period. She has considered hormonal IUDs in the past, and would like one. She is not currently sexually active, and has no intention of having more children. Pt could possibly have PCOS, and is working on losing weight with walking regimen, metformin, and improved diet. Because she is not interested in having more children, has anemia, and would like to have menorrhagia treated, she would like mirena placed and PCOS work up is not necessary at this time.  - Pt to schedule appt for mirena IUD placement

## 2019-06-22 NOTE — Assessment & Plan Note (Signed)
Pt has L arm pain only when walking that resolves with rest, has DMT2. EKG NSR at last visit. Risk stratification labs obtained since last visit: Lipid panel reveals mild hypertriglyceridemia, A1c at goal with metformin. CBC reveals anemia, pt has menorrhagia, hgb is 10.8, which is not low enough to typically indicate demand ischemia related to anemia.  - ASCVD risk 3.9%, will start moderate intensity statin due to DMT2 - Referral for cardiac stress test - strict return precautions for chest pain, SOB, diaphoresis, GERD sx, n/v given to pt to seek immediate medical care

## 2019-06-30 NOTE — Addendum Note (Signed)
Addended by: Bridget Hartshorn on: 06/30/2019 04:13 PM   Modules accepted: Orders

## 2019-07-07 ENCOUNTER — Other Ambulatory Visit: Payer: Self-pay

## 2019-07-07 ENCOUNTER — Ambulatory Visit (INDEPENDENT_AMBULATORY_CARE_PROVIDER_SITE_OTHER): Payer: Medicaid Other | Admitting: Family Medicine

## 2019-07-07 VITALS — BP 110/75 | HR 77 | Temp 98.6°F | Wt 270.0 lb

## 2019-07-07 DIAGNOSIS — Z30430 Encounter for insertion of intrauterine contraceptive device: Secondary | ICD-10-CM | POA: Diagnosis not present

## 2019-07-07 LAB — POCT URINE PREGNANCY: Preg Test, Ur: NEGATIVE

## 2019-07-07 MED ORDER — ACETAMINOPHEN 500 MG PO CAPS
500.0000 mg | ORAL_CAPSULE | Freq: Once | ORAL | Status: AC
  Administered 2019-07-07: 500 mg via ORAL

## 2019-07-07 MED ORDER — LEVONORGESTREL 20 MCG/24HR IU IUD
1.0000 | INTRAUTERINE_SYSTEM | Freq: Once | INTRAUTERINE | Status: AC
  Administered 2019-07-07: 1 via INTRAUTERINE

## 2019-07-07 NOTE — Progress Notes (Signed)
  GYNECOLOGY CLINIC PROCEDURE NOTE  Dorothy Haynes is a 33 y.o.  Here for Mirena IUD insertion.has hx of dysfunctional uterine bleeding.  Last pap smear was on 03/20/2017 and was normal.  IUD Insertion Procedure Note Patient identified, informed consent performed.  Discussed risks of irregular bleeding, cramping, infection, malpositioning or misplacement of the IUD outside the uterus which may require further procedure such as laparoscopy. Time out was performed.  Urine pregnancy test negative.  Speculum placed in the vagina.  Cervix visualized.  Cleaned with Betadine x 2.  Grasped anteriorly with a single tooth tenaculum.  Uterus sounded to 8cm.  Mirena IUD placed per manufacturer's recommendations.  Strings trimmed to 3 cm. Tenaculum was removed, considerable amount of bleeding post removal which stopped after 1-2 mins..  Patient tolerated procedure well.   Patient was not given post-procedure instructions and she left without waiting for instruction. Patient was also asked to check IUD strings periodically and follow up in 1-2 weeks for IUD check.

## 2019-07-07 NOTE — Addendum Note (Signed)
Addended by: Christen Bame D on: 07/07/2019 11:48 AM   Modules accepted: Orders

## 2019-07-07 NOTE — Patient Instructions (Addendum)
Hi Ms Aldape,  It was lovely to see you today. Today we inserted the Mirena coil for your heavy periods. It was inserted successfully. You may see some bleeding and period like cramping today. You can take tylenol or ibuprofen if you need to. Please obstain from sexual intercourse for next 24 hrs.   Please come to our clinic for a follow up in 1-2 weeks so we can check that the strings of your mirena coil are in the correct place.  Please do not hesitate to call us at the clinic if you have any concerns in the meantime on [phone].  Best wishes,  Dr Lattie Haw MD

## 2019-07-11 ENCOUNTER — Telehealth: Payer: Self-pay | Admitting: Family Medicine

## 2019-07-11 NOTE — Telephone Encounter (Signed)
She was returning your call regarding her cramping, she had missed your call.  [phone].

## 2019-07-12 NOTE — Telephone Encounter (Signed)
I tried calling her back, had to LVM again. Patient essentially needs an apt to check strings.

## 2019-07-12 NOTE — Progress Notes (Signed)
   Subjective:    Patient ID: Dorothy Haynes, female    DOB: 16-Dec-1985, 33 y.o.   MRN: [id number]   CC:IUD string check  HPI: IUD string check Patient only recently seen by Dr. Gwendlyn Deutscher and Dr. Posey Pronto for IUD placement.  This was on 07/07/2019.  Was advised to follow-up in 1 to 2 weeks for IUD check.  Patient reports that since IUD insertion she has had significant cramping.  Initially started on her left side on Friday but now comes and goes and can be on both sides.  Denies any vaginal bleeding.  Denies any vaginal discharge.  Patient is not sexually active.  Has a history of diverticulitis and this feels similar but diverticulitis pain only occurs when she eats and feels that she is not eating most foods recently.  Has not tried to feel her strength that she was not told that she could do that.  Has not tried any medications for this.  Denies any smoking history, alcohol use only occasionally, no illicit drug use.  Objective:  BP 122/70   Pulse 84   Wt 274 lb (124.3 kg)   LMP 06/16/2019 (Exact Date)   SpO2 97%   BMI 48.54 kg/m  Vitals and nursing note reviewed  General: well nourished, in no acute distress HEENT: normocephalic Cardiac: RRR, clear S1 and S2, no murmurs, rubs, or gallops Respiratory: clear to auscultation bilaterally, no increased work of breathing Extremities: no edema or cyanosis Skin: warm and dry, no rashes noted Neuro: alert and oriented, no focal deficits Female genitalia: normal external genitalia, vulva, vagina, cervix, uterus and adnexa, IUD strings visualized. Thick discharge noted  Assessment & Plan:    Uterine cramping Patient with cramping likely secondary to the IUD insertion.  Will obtain pelvic ultrasound so we can ensure correct placement.  I was able to visualize strings on pelvic exam which is reassuring.  Some vaginal discharge noted as well.  Wet prep showing BV with some yeast.  There is also concerned that with IUD insertion she could possibly  have PID.  Will obtain GC chlamydia testing as well.  We will plan to empirically treat with ceftriaxone in clinic as well as p.o. doxycycline x14 days.  We will also give metronidazole x7 days given diagnosis of BV.  Have given Diflucan as patient will likely need this after her antibiotic use.  Advised to use ibuprofen for cramping.  No fevers.  Strict return precautions given.  Advised that she try and keep IUD in but if she desires to have removal she should schedule this.  I am happy to do it in my clinic if she desires otherwise she can schedule with PCP.  Strict return precautions given.  Follow-up if no improvement.    Return if symptoms worsen or fail to improve.   Caroline More, DO, PGY-3

## 2019-07-13 ENCOUNTER — Ambulatory Visit (INDEPENDENT_AMBULATORY_CARE_PROVIDER_SITE_OTHER): Payer: Medicaid Other | Admitting: Family Medicine

## 2019-07-13 ENCOUNTER — Ambulatory Visit (INDEPENDENT_AMBULATORY_CARE_PROVIDER_SITE_OTHER): Payer: Medicaid Other | Admitting: Cardiovascular Disease

## 2019-07-13 ENCOUNTER — Encounter: Payer: Self-pay | Admitting: Cardiovascular Disease

## 2019-07-13 ENCOUNTER — Other Ambulatory Visit (HOSPITAL_COMMUNITY)
Admission: RE | Admit: 2019-07-13 | Discharge: 2019-07-13 | Disposition: A | Discharge status: Home / Self Care | Payer: Medicaid Other | Source: Ambulatory Visit | Attending: Family Medicine | Admitting: Family Medicine

## 2019-07-13 ENCOUNTER — Other Ambulatory Visit: Payer: Self-pay

## 2019-07-13 VITALS — BP 122/70 | HR 84 | Wt 274.0 lb

## 2019-07-13 DIAGNOSIS — E782 Mixed hyperlipidemia: Secondary | ICD-10-CM | POA: Diagnosis not present

## 2019-07-13 DIAGNOSIS — N9489 Other specified conditions associated with female genital organs and menstrual cycle: Secondary | ICD-10-CM | POA: Diagnosis not present

## 2019-07-13 DIAGNOSIS — E785 Hyperlipidemia, unspecified: Secondary | ICD-10-CM | POA: Insufficient documentation

## 2019-07-13 DIAGNOSIS — N898 Other specified noninflammatory disorders of vagina: Secondary | ICD-10-CM | POA: Diagnosis not present

## 2019-07-13 DIAGNOSIS — R072 Precordial pain: Secondary | ICD-10-CM

## 2019-07-13 DIAGNOSIS — E1169 Type 2 diabetes mellitus with other specified complication: Secondary | ICD-10-CM | POA: Insufficient documentation

## 2019-07-13 DIAGNOSIS — I10 Essential (primary) hypertension: Secondary | ICD-10-CM

## 2019-07-13 DIAGNOSIS — N730 Acute parametritis and pelvic cellulitis: Secondary | ICD-10-CM | POA: Diagnosis not present

## 2019-07-13 DIAGNOSIS — M79602 Pain in left arm: Secondary | ICD-10-CM | POA: Diagnosis not present

## 2019-07-13 LAB — POCT WET PREP (WET MOUNT)
Clue Cells Wet Prep Whiff POC: POSITIVE
Trichomonas Wet Prep HPF POC: ABSENT

## 2019-07-13 MED ORDER — METRONIDAZOLE 500 MG PO TABS: 500.0000 mg | ORAL_TABLET | Freq: Two times a day (BID) | ORAL | 0 refills | Status: AC

## 2019-07-13 MED ORDER — CEFTRIAXONE SODIUM 250 MG IJ SOLR
250.0000 mg | Freq: Once | INTRAMUSCULAR | Status: AC
  Administered 2019-07-13: 250 mg via INTRAMUSCULAR

## 2019-07-13 MED ORDER — DOXYCYCLINE HYCLATE 100 MG PO TABS: 100.0000 mg | ORAL_TABLET | Freq: Two times a day (BID) | ORAL | 0 refills | Status: AC

## 2019-07-13 MED ORDER — FLUCONAZOLE 150 MG PO TABS: ORAL_TABLET | ORAL | 0 refills | Status: DC

## 2019-07-13 NOTE — Assessment & Plan Note (Signed)
10-20-pack-year history tobacco abuse having quit a year ago

## 2019-07-13 NOTE — Assessment & Plan Note (Addendum)
Dorothy Haynes was referred to me by Dr. Chauncey Reading for exertional left arm pain.  She denies chest pain.  She does have positive risk factors although her left arm pain does not sound like an anginal equivalent to me.  I am going to get a coronary calcium score to risk stratify her.

## 2019-07-13 NOTE — Assessment & Plan Note (Signed)
Patient with cramping likely secondary to the IUD insertion.  Will obtain pelvic ultrasound so we can ensure correct placement.  I was able to visualize strings on pelvic exam which is reassuring.  Some vaginal discharge noted as well.  Wet prep showing BV with some yeast.  There is also concerned that with IUD insertion she could possibly have PID.  Will obtain GC chlamydia testing as well.  We will plan to empirically treat with ceftriaxone in clinic as well as p.o. doxycycline x14 days.  We will also give metronidazole x7 days given diagnosis of BV.  Have given Diflucan as patient will likely need this after her antibiotic use.  Advised to use ibuprofen for cramping.  No fevers.  Strict return precautions given.  Advised that she try and keep IUD in but if she desires to have removal she should schedule this.  I am happy to do it in my clinic if she desires otherwise she can schedule with PCP.  Strict return precautions given.  Follow-up if no improvement.

## 2019-07-13 NOTE — Patient Instructions (Signed)
Medication Instructions:  Your physician recommends that you continue on your current medications as directed. Please refer to the Current Medication list given to you today.  If you need a refill on your cardiac medications before your next appointment, please call your pharmacy.   Lab work: NONE  Testing/Procedures: Coronary Calcium Score  Follow-Up: At [facility], you and your health needs are our priority.  As part of our continuing mission to provide you with exceptional heart care, we have created designated Provider Care Teams.  These Care Teams include your primary Cardiologist (physician) and Advanced Practice Providers (APPs -  Physician Assistants and Nurse Practitioners) who all work together to provide you with the care you need, when you need it. You may see Dr Allyson Sabal or one of the following Advanced Practice Providers on your designated Care Team:    Corine Shelter, PA-C  [place], [place]  Edd Fabian, FNP   Follow-up appointment as needed.   Any Other Special Instructions Will Be Listed Below (If Applicable).  Coronary Calcium Scan A coronary calcium scan is an imaging test used to look for deposits of calcium and other fatty materials (plaques) in the inner lining of the blood vessels of the heart (coronary arteries). These deposits of calcium and plaques can partly clog and narrow the coronary arteries without producing any symptoms or warning signs. This puts a person at risk for a heart attack. This test can detect these deposits before symptoms develop. Tell a health care provider about:  Any allergies you have.  All medicines you are taking, including vitamins, herbs, eye drops, creams, and over-the-counter medicines.  Any problems you or family members have had with anesthetic medicines.  Any blood disorders you have.  Any surgeries you have had.  Any medical conditions you have.  Whether you are pregnant or may be pregnant. What are the  risks? Generally, this is a safe procedure. However, problems may occur, including:  Harm to a pregnant woman and her unborn baby. This test involves the use of radiation. Radiation exposure can be dangerous to a pregnant woman and her unborn baby. If you are pregnant, you generally should not have this procedure done.  Slight increase in the risk of cancer. This is because of the radiation involved in the test. What happens before the procedure? No preparation is needed for this procedure. What happens during the procedure?   You will undress and remove any jewelry around your neck or chest.  You will put on a hospital gown.  Sticky electrodes will be placed on your chest. The electrodes will be connected to an electrocardiogram (ECG) machine to record a tracing of the electrical activity of your heart.  A CT scanner will take pictures of your heart. During this time, you will be asked to lie still and hold your breath for 2-3 seconds while a picture of your heart is being taken. The procedure may vary among health care providers and hospitals. What happens after the procedure?  You can get dressed.  You can return to your normal activities.  It is up to you to get the results of your test. Ask your health care provider, or the department that is doing the test, when your results will be ready. Summary  A coronary calcium scan is an imaging test used to look for deposits of calcium and other fatty materials (plaques) in the inner lining of the blood vessels of the heart (coronary arteries).  Generally, this is a safe procedure.  Tell your health care provider if you are pregnant or may be pregnant.  No preparation is needed for this procedure.  A CT scanner will take pictures of your heart.  You can return to your normal activities after the scan is done. This information is not intended to replace advice given to you by your health care provider. Make sure you discuss any  questions you have with your health care provider. Document Released: 02/14/2008 Document Revised: 07/31/2017 Document Reviewed: 07/07/2016 Elsevier Patient Education  2020 [employer].

## 2019-07-13 NOTE — Patient Instructions (Signed)

## 2019-07-13 NOTE — Assessment & Plan Note (Signed)
History of hyperlipidemia on simvastatin with lipid profile performed 06/02/2019 revealing a total cholesterol of 170.

## 2019-07-13 NOTE — Assessment & Plan Note (Signed)
History of essential hypertension blood pressure measured at 135/95.  She is on hydrochlorothiazide.

## 2019-07-13 NOTE — Progress Notes (Signed)
07/13/2019 Dorothy Haynes   14-Oct-1985  [id number]  Primary Physician Gladys Damme, MD Primary Cardiologist: Lorretta Harp MD FACP, [place], [place], [place]  HPI:  Dorothy Haynes is a 33 y.o. morbidly overweight single mother of one 34 year old child referred to me by Dr. Chauncey Reading for exertional left arm pain.  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  Risk factors include 1020-pack-year history tobacco abuse having quit a year ago as well as treated hypertension, hyperlipidemia and diabetes.  Her mother recently had stents.  She is never had heart attack or stroke.  She is developed left arm pain with ambulation over the last several months.  She denies chest pain.   Current Meds  Medication Sig  . ACCU-CHEK SOFTCLIX LANCETS lancets Use as instructed  . baclofen (LIORESAL) 10 MG tablet Take 1 tablet (10 mg total) by mouth 3 (three) times daily.  . blood glucose meter kit and supplies KIT Dispense based on patient and insurance preference. Use up to four times daily as directed. (FOR ICD-9 250.00, 250.01).  Marland Kitchen doxycycline (VIBRA-TABS) 100 MG tablet Take 1 tablet (100 mg total) by mouth 2 (two) times daily for 14 days.  . fluconazole (DIFLUCAN) 150 MG tablet Take 1 tablet now and then a second tablet in 72 hrs.  . glucose blood (ACCU-CHEK AVIVA) test strip 1 each by Other route as directed. Use as instructed  . hydrochlorothiazide (HYDRODIURIL) 12.5 MG tablet Take 1 tablet (12.5 mg total) by mouth daily.  . metFORMIN (GLUCOPHAGE) 1000 MG tablet Take 1 tablet (1,000 mg total) by mouth 2 (two) times daily with a meal.  . metroNIDAZOLE (FLAGYL) 500 MG tablet Take 1 tablet (500 mg total) by mouth 2 (two) times daily for 7 days.  . rosuvastatin (CRESTOR) 10 MG tablet Take 1 tablet (10 mg total) by mouth daily.  . simvastatin (ZOCOR) 20 MG tablet Take 1 tablet (20 mg total) by mouth at bedtime.  . traZODone (DESYREL) 50 MG tablet TAKE 1 TAB 1 HOUR BEFORE SLEEP  ON DAY OF SLEEP STUDY.     No Known Allergies  Social History   Socioeconomic History  . Marital status: Single    Spouse name: Not on file  . Number of children: 1  . Years of education: college graduate  . Highest education level: Not on file  Occupational History    Employer: [employer]    Comment: part-time  Social Needs  . Financial resource strain: Not on file  . Food insecurity    Worry: Not on file    Inability: Not on file  . Transportation needs    Medical: Not on file    Non-medical: Not on file  Tobacco Use  . Smoking status: Former Smoker    Packs/day: 0.20    Years: 17.00    Pack years: 3.40    Types: Cigarettes    Quit date: 03/01/2018    Years since quitting: 1.3  . Smokeless tobacco: Never Used  Substance and Sexual Activity  . Alcohol use: Yes    Comment: occ, 1-2 drink per week  . Drug use: No    Comment: previously used MJ, has been off recreational drugs  . Sexual activity: Not Currently    Partners: Male    Birth control/protection: I.U.D.  Lifestyle  . Physical activity    Days per week: Not on file    Minutes per session: Not on file  . Stress: Not  on file  Relationships  . Social Herbalist on phone: Not on file    Gets together: Not on file    Attends religious service: Not on file    Active member of club or organization: Not on file    Attends meetings of clubs or organizations: Not on file    Relationship status: Not on file  . Intimate partner violence    Fear of current or ex partner: Not on file    Emotionally abused: Not on file    Physically abused: Not on file    Forced sexual activity: Not on file  Other Topics Concern  . Not on file  Social History Narrative   Lives with mother and son     Review of Systems: General: negative for chills, fever, night sweats or weight changes.  Cardiovascular: negative for chest pain, dyspnea on exertion, edema, orthopnea, palpitations, paroxysmal nocturnal dyspnea or  shortness of breath Dermatological: negative for rash Respiratory: negative for cough or wheezing Urologic: negative for hematuria Abdominal: negative for nausea, vomiting, diarrhea, bright red blood per rectum, melena, or hematemesis Neurologic: negative for visual changes, syncope, or dizziness All other systems reviewed and are otherwise negative except as noted above.    Blood pressure (!) 135/95, pulse 84, height 5' 3"  (1.6 m), weight 270 lb (122.5 kg), last menstrual period 06/16/2019, SpO2 99 %.  General appearance: alert and no distress Neck: no adenopathy, no carotid bruit, no JVD, supple, symmetrical, trachea midline and thyroid not enlarged, symmetric, no tenderness/mass/nodules Lungs: clear to auscultation bilaterally Heart: regular rate and rhythm, S1, S2 normal, no murmur, click, rub or gallop Extremities: extremities normal, atraumatic, no cyanosis or edema Pulses: 2+ and symmetric Skin: Skin color, texture, turgor normal. No rashes or lesions Neurologic: Alert and oriented X 3, normal strength and tone. Normal symmetric reflexes. Normal coordination and gait  EKG sinus rhythm 84 without ST or T wave changes.  Personally reviewed this EKG.  ASSESSMENT AND PLAN:   Morbid obesity ([place]) Morbid obesity with a BMI of 47  Tobacco abuse 10-20-pack-year history tobacco abuse having quit a year ago  Essential hypertension History of essential hypertension blood pressure measured at 135/95.  She is on hydrochlorothiazide.  Hyperlipidemia History of hyperlipidemia on simvastatin with lipid profile performed 06/02/2019 revealing a total cholesterol of 170.  Left arm pain [name] was referred to me by Dr. Chauncey Reading for exertional left arm pain.  She denies chest pain.  She does have positive risk factors although her left arm pain does not sound like an anginal equivalent to me.  I am going to get a coronary calcium score to risk stratify her.      Lorretta Harp MD  FACP,FACC,FAHA, [facility] 07/13/2019 4:47 PM

## 2019-07-13 NOTE — Assessment & Plan Note (Signed)
Morbid obesity with a BMI of 47

## 2019-07-14 LAB — CERVICOVAGINAL ANCILLARY ONLY
Chlamydia: NEGATIVE
Comment: NEGATIVE
Comment: NORMAL
Neisseria Gonorrhea: NEGATIVE

## 2019-07-15 ENCOUNTER — Telehealth: Payer: Self-pay | Admitting: *Deleted

## 2019-07-15 NOTE — Telephone Encounter (Signed)
-----   Message from Caroline More, DO sent at 07/15/2019  8:48 AM EST ----- Please inform patient that results of GC/chlamydia are negative.

## 2019-07-15 NOTE — Telephone Encounter (Signed)
Pt informed. Deseree Blount, CMA  

## 2019-07-20 ENCOUNTER — Ambulatory Visit: Payer: Medicaid Other | Admitting: Family Medicine

## 2019-07-21 ENCOUNTER — Telehealth: Payer: Self-pay | Admitting: Family Medicine

## 2019-07-21 ENCOUNTER — Ambulatory Visit (HOSPITAL_COMMUNITY)
Admission: RE | Admit: 2019-07-21 | Discharge: 2019-07-21 | Disposition: A | Discharge status: Home / Self Care | Payer: Medicaid Other | Source: Ambulatory Visit | Attending: Family Medicine | Admitting: Family Medicine

## 2019-07-21 ENCOUNTER — Other Ambulatory Visit: Payer: Self-pay

## 2019-07-21 DIAGNOSIS — N9489 Other specified conditions associated with female genital organs and menstrual cycle: Secondary | ICD-10-CM | POA: Insufficient documentation

## 2019-07-21 DIAGNOSIS — Z30431 Encounter for routine checking of intrauterine contraceptive device: Secondary | ICD-10-CM | POA: Diagnosis not present

## 2019-07-21 DIAGNOSIS — T839XXA Unspecified complication of genitourinary prosthetic device, implant and graft, initial encounter: Secondary | ICD-10-CM

## 2019-07-21 DIAGNOSIS — R102 Pelvic and perineal pain: Secondary | ICD-10-CM | POA: Diagnosis not present

## 2019-07-21 NOTE — Telephone Encounter (Signed)
Patient informed of ultrasound results.  Shantina Chronister,CMA  

## 2019-07-21 NOTE — Telephone Encounter (Signed)
Patient informed of ultrasound results.  Dorothy Haynes,CMA

## 2019-07-21 NOTE — Telephone Encounter (Signed)
Pt had cramping after recent IUD placement. Sent for [place], which revealed that the R arm of the IUD is in the myometrial wall. Will refer to GYN. Left VM for patient to call to discuss [place] results. RN/CMA can instruct patient about results and referral to GYN, I am also happy to discuss results with her if she would like.  Gladys Damme, MD [place], PGY-1

## 2019-07-21 NOTE — Telephone Encounter (Signed)
Ultrasound pelvic complete reviewed.  IUD was identified at the upper uterine segment endometrial canal with the right limb appearing to extend into the myometrial wall.    I discussed this with Dr. Marice Potter, [facility] fellow in the preceptor room, she was able to review ultrasound as well.  Dr. Marice Potter, after discussing with GYN attending, recommended patient be seen in colposcopy clinic where accurate Placement of IUD can be confirmed.  She recommended colposcopy clinic over referral to gynecology. IUD strings will need to be visualized, a small tug should be done to help release the right limb from the myometrial wall.  Should be small enough to release the limb without pulling the IUD out.  Dr. Marice Potter recommended this be done in colposcopy clinic by an attending rather than in the residency clinic. If IUD was not bothering patient then nothing further would need to be done but since she is having significant cramping she may need to have IUD placement adjusted. I discussed this with patient.  She states she is willing to do this and if she continues to have cramping she would like removal altogether. Recommended ibuprofen prior to procedure to help with cramping.   I reviewed documentation from IUD insertion on 07/07/2019 by Dr. Posey Pronto.  Per that note a considerable amount of bleeding post removal was seen, stopped after 1 to 2 minutes.  On my exam on 07/13/2019 IUD strings were able to be visualized.  Was able to schedule patient in colposcopy clinic on 08/04/2019.  Date and time selected per patient's preference.  At this appointment IUD string should be visualized.  Attending should give a small tug to the strings to ensure release of right limb from the myometrial wall without removing the entire IUD.  This was per the recommendation of Dr. Marice Potter who discussed with gynecology attending.  Will forward this message to both Dr. Gwendlyn Deutscher and Dr. Nori Riis as they are the colposcopy clinic attendings.  Dalphine Handing,  PGY-3 [facility] 07/21/2019 2:23 PM

## 2019-07-21 NOTE — Telephone Encounter (Signed)
I called patient to advise her that an appointment has been opened up for her for tomorrow at 8 AM. I will not be able to see her if she arrives at 8:30 due to a prescheduled meeting and their might not be rooms available for me to use as well. She verbalized understanding.

## 2019-07-21 NOTE — Telephone Encounter (Signed)
Pt called requesting to get a call regarding her [place] results. [phone]

## 2019-07-21 NOTE — Telephone Encounter (Signed)
Thank you for sharing this information with me Dr. Tammi Klippel. I contacted the patient to check on her and recommended that she comes in sooner than later due to concern about further migration of the IUD device. She is willing to come in tomorrow between 8 and 8:30 AM. I will open up a slot for her on my schedule to get it out. She agreed with the plan and verbalized understanding.

## 2019-07-22 ENCOUNTER — Ambulatory Visit (INDEPENDENT_AMBULATORY_CARE_PROVIDER_SITE_OTHER): Payer: Medicaid Other | Admitting: Family Medicine

## 2019-07-22 ENCOUNTER — Encounter: Payer: Self-pay | Admitting: Family Medicine

## 2019-07-22 ENCOUNTER — Ambulatory Visit: Payer: Medicaid Other | Admitting: Family Medicine

## 2019-07-22 DIAGNOSIS — T8332XD Displacement of intrauterine contraceptive device, subsequent encounter: Secondary | ICD-10-CM | POA: Diagnosis not present

## 2019-07-22 NOTE — Progress Notes (Signed)
Patient ID: Dorothy Haynes, female   DOB: October 18, 1985, 33 y.o.   MRN: [id number]   IUD Intrauterine Device Removal Procedure Note  CC:  The patient c/o lower abdominal cramping and intermittent bleeding since she got her IUD inserted on 07/07/2019. She had a pelvic U/S done, which shows IUD at the upper uterine segment endometrial canal with the right limb appearing to extend into the myometrial wall. The patient will not like to get on any birth control after removing her IUD. She stated that she would use condoms to prevent pregnancy.  PRE-OP DIAGNOSIS: IUD Malposition  POST-OP DIAGNOSIS: Same   PROCEDURE: IUD removal  Performing Physician: Andrena Mews, MD, MPH  Supervising Physician (if applicable): N/A  CONSENT: Both verbal and written informed consent obtained.   PROCEDURE:  The speculum was placed and the IUD string visualized. Small blood seen within the cervical Os prior to IUD removal. Using ringed forceps, the IUD string was grasped and the device was removed without difficulty.  Bleeding was minimal.   Followup: The patient tolerated the procedure well without complications.  Standard post-procedure care is explained and return precautions are given.   OCP/Depo offered but she declined. F/U with PCP as needed. Return precaution discussed.

## 2019-07-22 NOTE — Patient Instructions (Signed)
It was nice seeing you today. We removed your IUD without difficulty. You might spot for few hours. If there is no improvement in your symptoms, or you are bleeding or having discharge please call. Use condom to prevent pregnancy.

## 2019-08-01 ENCOUNTER — Encounter (HOSPITAL_COMMUNITY): Payer: Self-pay | Admitting: Family Medicine

## 2019-08-03 ENCOUNTER — Inpatient Hospital Stay: Admission: RE | Admit: 2019-08-03 | Payer: Medicaid Other | Source: Ambulatory Visit

## 2019-08-04 ENCOUNTER — Ambulatory Visit: Payer: Medicaid Other

## 2019-08-10 ENCOUNTER — Other Ambulatory Visit: Payer: Self-pay | Admitting: Family Medicine

## 2019-08-10 DIAGNOSIS — E119 Type 2 diabetes mellitus without complications: Secondary | ICD-10-CM

## 2019-08-12 ENCOUNTER — Telehealth (HOSPITAL_COMMUNITY): Payer: Self-pay

## 2019-08-12 NOTE — Telephone Encounter (Signed)
Encounter complete. 

## 2019-08-13 ENCOUNTER — Other Ambulatory Visit (HOSPITAL_COMMUNITY)
Admission: RE | Admit: 2019-08-13 | Discharge: 2019-08-13 | Disposition: A | Discharge status: Home / Self Care | Payer: Medicaid Other | Source: Ambulatory Visit | Attending: Cardiovascular Disease | Admitting: Cardiovascular Disease

## 2019-08-13 DIAGNOSIS — Z20828 Contact with and (suspected) exposure to other viral communicable diseases: Secondary | ICD-10-CM | POA: Insufficient documentation

## 2019-08-14 LAB — NOVEL CORONAVIRUS, NAA (HOSP ORDER, SEND-OUT TO REF LAB; TAT 18-24 HRS): SARS-CoV-2, NAA: NOT DETECTED

## 2019-08-17 ENCOUNTER — Ambulatory Visit (HOSPITAL_COMMUNITY)
Admission: RE | Admit: 2019-08-17 | Discharge: 2019-08-17 | Disposition: A | Discharge status: Home / Self Care | Payer: Medicaid Other | Source: Ambulatory Visit | Attending: Cardiology | Admitting: Cardiology

## 2019-08-17 ENCOUNTER — Ambulatory Visit (INDEPENDENT_AMBULATORY_CARE_PROVIDER_SITE_OTHER)
Admission: RE | Admit: 2019-08-17 | Discharge: 2019-08-17 | Disposition: A | Discharge status: Home / Self Care | Payer: Medicaid Other | Source: Ambulatory Visit | Attending: Cardiovascular Disease | Admitting: Cardiovascular Disease

## 2019-08-17 ENCOUNTER — Other Ambulatory Visit: Payer: Self-pay

## 2019-08-17 DIAGNOSIS — R072 Precordial pain: Secondary | ICD-10-CM

## 2019-08-17 DIAGNOSIS — I208 Other forms of angina pectoris: Secondary | ICD-10-CM | POA: Insufficient documentation

## 2019-08-17 LAB — EXERCISE TOLERANCE TEST
Estimated workload: 4.3 METS
Exercise duration (min): 1 min
Exercise duration (sec): 53 s
MPHR: 185 {beats}/min
Peak HR: 162 {beats}/min
Percent HR: 87 %
RPE: 18
Rest HR: 95 {beats}/min

## 2019-08-19 ENCOUNTER — Encounter: Payer: Self-pay | Admitting: Family Medicine

## 2019-08-19 ENCOUNTER — Other Ambulatory Visit: Payer: Self-pay

## 2019-08-19 ENCOUNTER — Ambulatory Visit: Payer: Medicaid Other | Admitting: Family Medicine

## 2019-08-19 ENCOUNTER — Ambulatory Visit (INDEPENDENT_AMBULATORY_CARE_PROVIDER_SITE_OTHER): Payer: Medicaid Other | Admitting: Family Medicine

## 2019-08-19 VITALS — BP 130/80 | HR 78 | Wt 275.0 lb

## 2019-08-19 DIAGNOSIS — N920 Excessive and frequent menstruation with regular cycle: Secondary | ICD-10-CM

## 2019-08-19 DIAGNOSIS — S0300XA Dislocation of jaw, unspecified side, initial encounter: Secondary | ICD-10-CM

## 2019-08-19 DIAGNOSIS — M722 Plantar fascial fibromatosis: Secondary | ICD-10-CM | POA: Diagnosis not present

## 2019-08-19 DIAGNOSIS — N921 Excessive and frequent menstruation with irregular cycle: Secondary | ICD-10-CM

## 2019-08-19 DIAGNOSIS — E119 Type 2 diabetes mellitus without complications: Secondary | ICD-10-CM

## 2019-08-19 DIAGNOSIS — Z1329 Encounter for screening for other suspected endocrine disorder: Secondary | ICD-10-CM | POA: Diagnosis not present

## 2019-08-19 DIAGNOSIS — Z720 Tobacco use: Secondary | ICD-10-CM

## 2019-08-19 DIAGNOSIS — D649 Anemia, unspecified: Secondary | ICD-10-CM

## 2019-08-19 DIAGNOSIS — I10 Essential (primary) hypertension: Secondary | ICD-10-CM

## 2019-08-19 DIAGNOSIS — R5381 Other malaise: Secondary | ICD-10-CM

## 2019-08-19 MED ORDER — IBUPROFEN 800 MG PO TABS: 800.0000 mg | ORAL_TABLET | Freq: Two times a day (BID) | ORAL | 0 refills | Status: DC

## 2019-08-19 NOTE — Patient Instructions (Addendum)
It was a pleasure seeing you today! The stress test and CT showed that your heart needs more exercise, but is still healthy and your chances of having a heart attack right now are very low.  For increasing exercise I suggest walking for 2 minutes at a time on a flat surface at least 2x per day. As this becomes more tolerable, increase your walking to 5 minutes, and so on.  For low blood counts (anemia) I am checking your thyroid today and will let you know if there is an abnormal result.  For best outcome with jaw pain, wear your mouthguard every night.  For best outcome with plantar fasciitis (foot pain) I suggest rolling your foot on a hard ball, frozen water bottle, or other hard surface first thing.  For your heavy menstrual bleeding, I recommend using ibuprofen 800 mg twice a day from 2 days before your period until day 3 of your period (total of 5 days).  I will see you in January for a repeat A1c.  Be Well!  Dr. Chauncey Reading

## 2019-08-19 NOTE — Progress Notes (Addendum)
Subjective:    Patient ID: Dorothy Haynes, female    DOB: May 22, 1986, 33 y.o.   MRN: [id number]   CC: follow up  HPI: Patient feeling well, presents today for follow up for arm pain after stress testing, cardiac CT, HTN, and menorrhea.  Patient still notes that walking is difficult for her because she gets very tired, but arm pain has resolved.  BP today appropriate at 130/80.  Pt has heavy, painful, regular menses. Mirena IUD was placed, but patient had residual pain and pelvic US revealed one arm of IUD in myometrium. Patient had IUD removed and is not interested in replacement of IUD at this time, but states that she may be interested in 12-20-19 after some time has passed. She is not a candidate for OCPs as she is <35 yo, morbidly obese, and a smoker.  For TMJ she saw her dentist and got a mouth guard which she wears inconsistently.   For plantar fasciitis, pain has improved and is intermittent, she has a hard roller ball to use to roll her foot.  Smoking status reviewed   ROS: pertinent noted in the HPI   Past medical history, surgical, family, and social history reviewed and updated in the EMR as appropriate.  Objective:  BP 130/80   Pulse 78   Wt 275 lb (124.7 kg)   LMP 08/06/2019 (Exact Date)   SpO2 100%   BMI 48.71 kg/m   Vitals and nursing note reviewed  General: NAD, pleasant, obese, able to participate in exam Cardiac: RRR, S1 S2 present. normal heart sounds, no murmurs. Respiratory: CTAB, normal effort, No wheezes, rales or rhonchi Extremities: no edema or cyanosis. Skin: warm and dry, no rashes noted Neuro: alert, no obvious focal deficits Psych: Normal affect and mood   Assessment & Plan:  Dorothy Haynes is a 33 yo woman with HTN, deconditioning, menorrhagia, TMJ, and plantar fasciitis.  Physical deconditioning Treadmill stress test showed no evidence of ischemia. Cardiac CT with 0 calcium score. Both of these are very reassuring and indicated very low  risk of MI in the next 10 years. Treadmill test showed that patient is severely deconditioned, she could only walk for <2 minutes. Her L arm pain that only occurred when exercising was likely due to deconditioning. - Walking regimen to improve physical stamina: suggest walking for 2 minutes on a flat surface (or 2% incline if on a treadmill) at least twice a day and then increasing to 3 min, 5 minutes, etc., every week  Essential hypertension BP appropriate today at 130/80. No change to current regimen of HCTZ.  Menorrhagia with irregular cycle Pt to consider repeat IUD in future. For now she would like some help with decreasing pain of periods. - Ibuprofen 600 mg BID on day before period, day 1 of period, and day 2 of period.  Tobacco abuse Patient not interested in assistance at this time.  Anemia Likely due to heavy periods, due to unfortunate malpositioning of IUD, will wait for further treatment. Iron studies WNL. Will test TSH and fT4 as patient notes she has strong family hx of hypothyroidism and last TSH lab was in Dec 19, 2016. - Obtain TSH, fT4 today.  Type 2 diabetes mellitus without complication, without long-term current use of insulin (HCC) Next A1c check in January 2021  Plantar fasciitis, bilateral Improved pain, now only intermittent. Recommend continued use of rolling foot on hard ball, frozen water bottle first thing in AM.  TMJ (dislocation of temporomandibular joint), initial encounter Improved  pain with mouth guard. Recommend regular use of mouth guard nightly to reduce pain further.   Gladys Damme, MD [place] [facility] PGY-1

## 2019-08-20 LAB — TSH: TSH: 1.38 u[IU]/mL (ref 0.450–4.500)

## 2019-08-20 LAB — T4, FREE: Free T4: 1.13 ng/dL (ref 0.82–1.77)

## 2019-08-21 NOTE — Assessment & Plan Note (Signed)
Improved pain with mouth guard. Recommend regular use of mouth guard nightly to reduce pain further.

## 2019-08-21 NOTE — Assessment & Plan Note (Signed)
BP appropriate today at 130/80. No change to current regimen of HCTZ.

## 2019-08-21 NOTE — Assessment & Plan Note (Signed)
Next A1c check in January 2021

## 2019-08-21 NOTE — Assessment & Plan Note (Signed)
Patient not interested in assistance at this time.

## 2019-08-21 NOTE — Assessment & Plan Note (Signed)
Improved pain, now only intermittent. Recommend continued use of rolling foot on hard ball, frozen water bottle first thing in AM.

## 2019-08-21 NOTE — Assessment & Plan Note (Signed)
Pt to consider repeat IUD in future. For now she would like some help with decreasing pain of periods. - Ibuprofen 600 mg BID on day before period, day 1 of period, and day 2 of period.

## 2019-08-21 NOTE — Assessment & Plan Note (Signed)
Treadmill stress test showed no evidence of ischemia. Cardiac CT with 0 calcium score. Both of these are very reassuring and indicated very low risk of MI in the next 10 years. Treadmill test showed that patient is severely deconditioned, she could only walk for <2 minutes. Her L arm pain that only occurred when exercising was likely due to deconditioning. - Walking regimen to improve physical stamina: suggest walking for 2 minutes on a flat surface (or 2% incline if on a treadmill) at least twice a day and then increasing to 3 min, 5 minutes, etc., every week

## 2019-08-21 NOTE — Assessment & Plan Note (Signed)
Likely due to heavy periods, due to unfortunate malpositioning of IUD, will wait for further treatment. Iron studies WNL. Will test TSH and fT4 as patient notes she has strong family hx of hypothyroidism and last TSH lab was in 2018. - Obtain TSH, fT4 today.

## 2019-09-05 ENCOUNTER — Encounter: Payer: Medicaid Other | Admitting: Family Medicine

## 2019-09-13 ENCOUNTER — Other Ambulatory Visit: Payer: Self-pay | Admitting: *Deleted

## 2019-09-13 ENCOUNTER — Telehealth: Payer: Self-pay | Admitting: Family Medicine

## 2019-09-13 DIAGNOSIS — E119 Type 2 diabetes mellitus without complications: Secondary | ICD-10-CM

## 2019-09-13 MED ORDER — METFORMIN HCL 1000 MG PO TABS: 1000.0000 mg | ORAL_TABLET | Freq: Two times a day (BID) | ORAL | 3 refills | Status: DC

## 2019-09-13 NOTE — Telephone Encounter (Signed)
err

## 2019-09-14 NOTE — Progress Notes (Signed)
[facility] Note  09/22/2019     CHIEF COMPLAINT Patient presents for Diabetic Eye Exam   HISTORY OF PRESENT ILLNESS: Dorothy Haynes is a 34 y.o. female who presents to the clinic today for:   HPI    Diabetic Eye Exam    Vision is stable.  Diabetes characteristics include Type 2.  Blood sugar level is controlled.  I, the attending physician,  performed the HPI with the patient and updated documentation appropriately.          Comments    BS: Doesn't check A1c: Unknown Following up Monday for blood work Pt states her vision has been fine and has no visual complaints.  Patient denies eye pain or discomfort and denies any new or worsening floaters or fol OU.       Last edited by Bernarda Caffey, MD on 09/22/2019 12:06 PM. (History)    pt states her last A1c was in the 6's, she states she has been going to the gym every day to try and lose weight, but there are no changes with her overall health  Referring physician: Bufford Lope, DO No address on file  HISTORICAL INFORMATION:   Selected notes from the MEDICAL RECORD NUMBER Referred by Dr. Orson Eva for DM exam LEE:  Ocular Hx- PMH-DM (A1C: 7.9, metformin), HTN, smoker    CURRENT MEDICATIONS: No current outpatient medications on file. (Ophthalmic Drugs)   No current facility-administered medications for this visit. (Ophthalmic Drugs)   Current Outpatient Medications (Other)  Medication Sig  . ACCU-CHEK SOFTCLIX LANCETS lancets Use as instructed  . baclofen (LIORESAL) 10 MG tablet Take 1 tablet (10 mg total) by mouth 3 (three) times daily.  . blood glucose meter kit and supplies KIT Dispense based on patient and insurance preference. Use up to four times daily as directed. (FOR ICD-9 250.00, 250.01).  . fluconazole (DIFLUCAN) 150 MG tablet Take 1 tablet now and then a second tablet in 72 hrs.  . glucose blood (ACCU-CHEK AVIVA) test strip 1 each by Other route as directed. Use as instructed   . hydrochlorothiazide (HYDRODIURIL) 12.5 MG tablet Take 1 tablet (12.5 mg total) by mouth daily.  Marland Kitchen ibuprofen (ADVIL) 800 MG tablet Take 1 tablet (800 mg total) by mouth 2 (two) times daily.  . metFORMIN (GLUCOPHAGE) 1000 MG tablet Take 1 tablet (1,000 mg total) by mouth 2 (two) times daily with a meal.  . rosuvastatin (CRESTOR) 10 MG tablet Take 1 tablet (10 mg total) by mouth daily.  . simvastatin (ZOCOR) 20 MG tablet Take 1 tablet (20 mg total) by mouth at bedtime.  . traZODone (DESYREL) 50 MG tablet TAKE 1 TAB 1 HOUR BEFORE SLEEP ON DAY OF SLEEP STUDY.   No current facility-administered medications for this visit. (Other)      REVIEW OF SYSTEMS: ROS    Positive for: Endocrine, Cardiovascular, Eyes   Negative for: Constitutional, Gastrointestinal, Neurological, Skin, Genitourinary, Musculoskeletal, HENT, Respiratory, Psychiatric, Allergic/Imm, Heme/Lymph   Last edited by Doneen Poisson on 09/22/2019  8:42 AM. (History)       ALLERGIES No Known Allergies  PAST MEDICAL HISTORY Past Medical History:  Diagnosis Date  . Diverticulitis 03/15/2018  . GERD (gastroesophageal reflux disease) 2009  . Gestational diabetes 2008  . Headache   . History of gallstones 10/2015   s/p cholecystectomy 10/09/15  . Hypertension   . Morbid obesity ([place]) 10/09/2015  . Shortness of breath dyspnea   . Tobacco abuse 10/09/2015  Past Surgical History:  Procedure Laterality Date  . CHOLECYSTECTOMY N/A 10/09/2015   Procedure: LAPAROSCOPIC CHOLECYSTECTOMY ;  Surgeon: Fanny Skates, MD;  Location: [facility];  Service: General;  Laterality: N/A;  . WISDOM TOOTH EXTRACTION      FAMILY HISTORY Family History  Problem Relation Age of Onset  . Diabetes Mother   . Hypertension Mother   . Cataracts Mother   . Throat cancer Father   . Kidney Stones Father   . Alzheimer's disease Other   . Diabetes Other   . Hypertension Other   . Heart disease Other   . Heart disease Other   . Diabetes Other   .  Hypertension Other   . Thyroid disease Other   . Diabetes Maternal Grandmother   . Diabetes Maternal Grandfather   . Blindness Maternal Aunt   . Amblyopia Neg Hx   . Glaucoma Neg Hx   . Macular degeneration Neg Hx   . Retinal detachment Neg Hx   . Strabismus Neg Hx   . Retinitis pigmentosa Neg Hx     SOCIAL HISTORY Social History   Tobacco Use  . Smoking status: Former Smoker    Packs/day: 0.20    Years: 17.00    Pack years: 3.40    Types: Cigarettes    Quit date: 03/01/2018    Years since quitting: 1.5  . Smokeless tobacco: Never Used  Substance Use Topics  . Alcohol use: Yes    Comment: occ, 1-2 drink per week  . Drug use: No    Comment: previously used MJ, has been off recreational drugs         OPHTHALMIC EXAM:  Base Eye Exam    Visual Acuity (Snellen - Linear)      Right Left   Dist cc 20/20 20/20       Tonometry (Tonopen, 8:43 AM)      Right Left   Pressure 17 22       Pupils      Dark Light Shape React APD   Right 3 2 Round Brisk 0   Left 3 2 Round Brisk 0       Visual Fields      Left Right    Full Full       Extraocular Movement      Right Left    Full Full       Neuro/Psych    Oriented x3: Yes   Mood/Affect: Normal       Dilation    Both eyes: 1.0% Mydriacyl, 2.5% Phenylephrine @ 8:43 AM        Slit Lamp and Fundus Exam    External Exam      Right Left   External Normal Normal       Slit Lamp Exam      Right Left   Lids/Lashes Normal Normal   Conjunctiva/Sclera mild Melanosis mild Melanosis   Cornea Trace inferior Punctate epithelial erosions Trace Punctate epithelial erosions   Anterior Chamber deep and clear deep and clear   Iris Round and dilated Round and dilated   Lens 1+ Nuclear sclerosis, 1-2+ Cortical cataract 1+ Nuclear sclerosis, 1-2+ Cortical cataract   Vitreous Mild Vitreous syneresis Normal       Fundus Exam      Right Left   Disc Pink and Sharp, no NVD Pink and Sharp   C/D Ratio 0.3 0.4   Macula Flat  Good foveal reflex, mild Retinal pigment epithelial mottling, No heme or edema Flat  Good foveal reflex, mild Retinal pigment epithelial mottling, No heme or edema   Vessels Vascular attenuation, Tortuous Vascular attenuation, Tortuous   Periphery Attached, pigmented peripheral cystoid degenration greatest inferiorly, No heme  Attached, pigmented peripheral cystoid degenration greatest inferiorly, No heme           IMAGING AND PROCEDURES  Imaging and Procedures for _0 @  OCT, Retina - OU - Both Eyes       Right Eye Quality was good. Central Foveal Thickness: 245. Progression has been stable. Findings include normal foveal contour, no IRF, no SRF.   Left Eye Central Foveal Thickness: 242. Progression has been stable. Findings include normal foveal contour, no IRF, no SRF.   Notes *Images captured and stored on drive  Diagnosis / Impression:  NFP; no IRF/SRF OU No DME OU  Clinical management:  See below  Abbreviations: NFP - Normal foveal profile. CME - cystoid macular edema. PED - pigment epithelial detachment. IRF - intraretinal fluid. SRF - subretinal fluid. EZ - ellipsoid zone. ERM - epiretinal membrane. ORA - outer retinal atrophy. ORT - outer retinal tubulation. SRHM - subretinal hyper-reflective material\                 ASSESSMENT/PLAN:    ICD-10-CM   1. Diabetes mellitus type 2 without retinopathy ([place])  E11.9   2. Retinal edema  H35.81 OCT, Retina - OU - Both Eyes  3. Essential hypertension  I10   4. Hypertensive retinopathy of both eyes  H35.033   5. Combined forms of age-related cataract of both eyes  H25.813     1,2. Diabetes mellitus, type 2 without retinopathy  - The incidence, risk factors for progression, natural history and treatment options for diabetic retinopathy  were discussed with patient.    - The need for close monitoring of blood glucose, blood pressure, and serum lipids, avoiding cigarette or any type of tobacco, and the need for long  term follow up was also discussed with patient.  - f/u in 1 year, sooner prn  3,4. Hypertensive retinopathy OU  - discussed importance of tight BP control  - monitor  5. Mild Mixed form cataract OU  - The symptoms of cataract, surgical options, and treatments and risks were discussed with patient.  - discussed diagnosis and progression  - very mild/early -- not yet visually significant  - continue monitoring for now   Ophthalmic Meds Ordered this visit:  No orders of the defined types were placed in this encounter.      Return in about 1 year (around 09/21/2020) for f/u DM exam, DFE, OCT.  There are no Patient Instructions on file for this visit.   Explained the diagnoses, plan, and follow up with the patient and they expressed understanding.  Patient expressed understanding of the importance of proper follow up care.   This document serves as a record of services personally performed by Gardiner Sleeper, MD, PhD. It was created on their behalf by Leeann Must, [place], a certified ophthalmic assistant. The creation of this record is the provider's dictation and/or activities during the visit.    Electronically signed by: Leeann Must, COA _1 @ 12:08 PM   This document serves as a record of services personally performed by Gardiner Sleeper, MD, PhD. It was created on their behalf by Ernest Mallick, OA, an ophthalmic assistant. The creation of this record is the provider's dictation and/or activities during the visit.    Electronically signed by: Ernest Mallick, OA 01.21.2021 12:08 PM  Gardiner Sleeper, M.D., Ph.D. Diseases & Surgery of the Retina and Vitreous [facility]  I have reviewed the above documentation for accuracy and completeness, and I agree with the above. Gardiner Sleeper, M.D., Ph.D. 09/22/19 12:08 PM    Abbreviations: M myopia (nearsighted); A astigmatism; H hyperopia (farsighted); P presbyopia; Mrx spectacle prescription;  CTL contact  lenses; OD right eye; OS left eye; OU both eyes  XT exotropia; ET esotropia; PEK punctate epithelial keratitis; PEE punctate epithelial erosions; DES dry eye syndrome; MGD meibomian gland dysfunction; ATs artificial tears; PFAT's preservative free artificial tears; [place] nuclear sclerotic cataract; PSC posterior subcapsular cataract; ERM epi-retinal membrane; PVD posterior vitreous detachment; RD retinal detachment; DM diabetes mellitus; DR diabetic retinopathy; NPDR non-proliferative diabetic retinopathy; PDR proliferative diabetic retinopathy; CSME clinically significant macular edema; DME diabetic macular edema; dbh dot blot hemorrhages; CWS cotton wool spot; POAG primary open angle glaucoma; C/D cup-to-disc ratio; HVF humphrey visual field; GVF goldmann visual field; OCT optical coherence tomography; IOP intraocular pressure; BRVO Branch retinal vein occlusion; CRVO central retinal vein occlusion; CRAO central retinal artery occlusion; BRAO branch retinal artery occlusion; RT retinal tear; SB scleral buckle; PPV pars plana vitrectomy; VH Vitreous hemorrhage; PRP panretinal laser photocoagulation; IVK intravitreal kenalog; VMT vitreomacular traction; MH Macular hole;  NVD neovascularization of the disc; NVE neovascularization elsewhere; AREDS age related eye disease study; ARMD age related macular degeneration; POAG primary open angle glaucoma; EBMD epithelial/anterior basement membrane dystrophy; ACIOL anterior chamber intraocular lens; IOL intraocular lens; PCIOL posterior chamber intraocular lens; Phaco/IOL phacoemulsification with intraocular lens placement; [place] photorefractive keratectomy; LASIK laser assisted in situ keratomileusis; HTN hypertension; DM diabetes mellitus; COPD chronic obstructive pulmonary disease

## 2019-09-22 ENCOUNTER — Encounter (INDEPENDENT_AMBULATORY_CARE_PROVIDER_SITE_OTHER): Payer: Self-pay | Admitting: Ophthalmology

## 2019-09-22 ENCOUNTER — Other Ambulatory Visit: Payer: Self-pay

## 2019-09-22 ENCOUNTER — Ambulatory Visit (INDEPENDENT_AMBULATORY_CARE_PROVIDER_SITE_OTHER): Payer: Medicaid Other | Admitting: Ophthalmology

## 2019-09-22 DIAGNOSIS — H3581 Retinal edema: Secondary | ICD-10-CM | POA: Diagnosis not present

## 2019-09-22 DIAGNOSIS — H35033 Hypertensive retinopathy, bilateral: Secondary | ICD-10-CM

## 2019-09-22 DIAGNOSIS — I10 Essential (primary) hypertension: Secondary | ICD-10-CM | POA: Diagnosis not present

## 2019-09-22 DIAGNOSIS — H25813 Combined forms of age-related cataract, bilateral: Secondary | ICD-10-CM

## 2019-09-22 DIAGNOSIS — E119 Type 2 diabetes mellitus without complications: Secondary | ICD-10-CM

## 2019-09-26 ENCOUNTER — Ambulatory Visit: Payer: Medicaid Other | Admitting: Family Medicine

## 2019-09-27 ENCOUNTER — Ambulatory Visit: Payer: Medicaid Other | Attending: Internal Medicine

## 2019-09-27 DIAGNOSIS — Z20822 Contact with and (suspected) exposure to covid-19: Secondary | ICD-10-CM

## 2019-09-28 LAB — NOVEL CORONAVIRUS, NAA: SARS-CoV-2, NAA: DETECTED — AB

## 2019-09-29 ENCOUNTER — Telehealth: Payer: Self-pay | Admitting: Physician Assistant

## 2019-09-29 NOTE — Telephone Encounter (Signed)
  I connected by phone with Megan Mans on 09/29/2019 at 9:22 AM to discuss the potential use of an new treatment for mild to moderate COVID-19 viral infection in non-hospitalized patients.  This patient is a 34 y.o. female that meets the FDA criteria for Emergency Use Authorization of bamlanivimab or casirivimab\imdevimab.  Has a (+) direct SARS-CoV-2 viral test result  Has mild or moderate COVID-19   Is ? 36 years of age and weighs ? 40 kg  Is NOT hospitalized due to COVID-19  Is NOT requiring oxygen therapy or requiring an increase in baseline oxygen flow rate due to COVID-19  Is within 10 days of symptom onset  Has at least one of the high risk factor(s) for progression to severe COVID-19 and/or hospitalization as defined in EUA.  Specific high risk criteria: Diabetes.   I have spoken and communicated the following to the patient or parent/caregiver:  1. FDA has authorized the emergency use of bamlanivimab and casirivimab\imdevimab for the treatment of mild to moderate COVID-19 in adults and pediatric patients with positive results of direct SARS-CoV-2 viral testing who are 36 years of age and older weighing at least 40 kg, and who are at high risk for progressing to severe COVID-19 and/or hospitalization.  2. The significant known and potential risks and benefits of bamlanivimab and casirivimab\imdevimab, and the extent to which such potential risks and benefits are unknown.  3. Information on available alternative treatments and the risks and benefits of those alternatives, including clinical trials.  4. Patients treated with bamlanivimab and casirivimab\imdevimab should continue to self-isolate and use infection control measures (e.g., wear mask, isolate, social distance, avoid sharing personal items, clean and disinfect "high touch" surfaces, and frequent handwashing) according to CDC guidelines.   5. The patient or parent/caregiver has the option to accept or refuse  bamlanivimab or casirivimab\imdevimab .  After reviewing this information with the patient, infusion declined.   Rutherford Guys, [place] - C 09/29/2019 9:22 AM

## 2019-10-03 ENCOUNTER — Telehealth: Payer: Self-pay | Admitting: Family Medicine

## 2019-10-03 NOTE — Telephone Encounter (Signed)
Pt is calling and would like to inform Dr. Leary Roca that she has tested positive for Covid and that she will call back to reschedule an appointment when she has finished her quarantine.

## 2019-10-13 ENCOUNTER — Other Ambulatory Visit: Payer: Self-pay

## 2019-10-13 ENCOUNTER — Ambulatory Visit: Payer: Medicaid Other | Admitting: Family Medicine

## 2019-10-13 ENCOUNTER — Ambulatory Visit (INDEPENDENT_AMBULATORY_CARE_PROVIDER_SITE_OTHER): Payer: Medicaid Other | Admitting: Family Medicine

## 2019-10-13 ENCOUNTER — Encounter: Payer: Self-pay | Admitting: Family Medicine

## 2019-10-13 ENCOUNTER — Telehealth: Payer: Self-pay | Admitting: Family Medicine

## 2019-10-13 VITALS — BP 132/80 | HR 87 | Wt 270.0 lb

## 2019-10-13 DIAGNOSIS — E119 Type 2 diabetes mellitus without complications: Secondary | ICD-10-CM | POA: Diagnosis not present

## 2019-10-13 DIAGNOSIS — G4733 Obstructive sleep apnea (adult) (pediatric): Secondary | ICD-10-CM

## 2019-10-13 LAB — POCT GLYCOSYLATED HEMOGLOBIN (HGB A1C): HbA1c, POC (controlled diabetic range): 6.5 % (ref 0.0–7.0)

## 2019-10-13 NOTE — Telephone Encounter (Signed)
Pt is calling because she the doctor to order her a CPAP machine. According to her sleep study this is necessary and she has to complete 30 days of this before she is allowed to go back to work . jw

## 2019-10-13 NOTE — Assessment & Plan Note (Addendum)
Discussed results of sleep study with patient.  Patient has mild sleep apnea.  Patient trying to lose weight and is exercising 5 days a week for about an hour.  -Consider CPAP

## 2019-10-13 NOTE — Assessment & Plan Note (Addendum)
a1c 6.5 today from 5.9 previously   Home regimen metformin 1000 mg BID.  Reports some diarrhea but states this is not new for her as she has diverticulosis.  Patient will benefit from GLP-1 agonist (Ozempic, Trulicity, Victoza) as this will also help her lose weight.  Patient declines GLP-1 agonist at this time.  - Reassess diarrhea at next visit, consider switching to Metformin ER -Recent lipid panel October 2020 : Elevated triglycerides -Taking simvastatin 20 mg; has room to increase this or switch to Crestor, will defer to PCP -Eye exam completed January 2020 -Foot exam up-to-date -GLP-1 agonist recommended for better control of diabetes and weight loss benefits - Follow up in 3 months for a1c recheck

## 2019-10-13 NOTE — Telephone Encounter (Signed)
Will forward to MD to place order for CPAP.  Sleep study perfomed 10/2018. Kassidee Narciso,CMA

## 2019-10-13 NOTE — Progress Notes (Signed)
    Subjective:  Dorothy Haynes is a 34 y.o. female who presents to the [facility] today with a chief complaint of diabetes. Marland Kitchen   HPI:   Dorothy Haynes is a 34 y.o. female here for diabetes follow-up.   Diabetes Mellitus  Eats 3 meals a day and 1-2 snacks.  Recently has been eating what she wants.  Denies missing any doses of DM medications and takes Metformin.  Reports some diarrhea however states she has diverticulosis.  Does not check her glucose regularly.  Has been working out 5 days a week for about an hour.    Sleep Apnea Patient would like the results of her sleep study.   Objective:  Physical Exam: BP 132/80   Pulse 87   Wt 270 lb (122.5 kg)   SpO2 99%   BMI 47.83 kg/m    GEN: pleasant well nourished female, in no acute distress  CV: regular rate and rhythm, no murmurs appreciated  RESP: no increased work of breathing, clear to ascultation bilaterally  NEURO:  moves all extremities appropriately, normal speech PSYCH: Normal affect and thought content SKIN: warm, dry    No results found for this or any previous visit (from the past 72 hour(s)).   Assessment/Plan:  Type 2 diabetes mellitus without complication, without long-term current use of insulin (HCC) a1c 6.5 today from 5.9 previously   Home regimen metformin 1000 mg BID.  Reports some diarrhea but states this is not new for her as she has diverticulosis.  Patient will benefit from GLP-1 agonist (Ozempic, Trulicity, Victoza) as this will also help her lose weight.  Patient declines GLP-1 agonist at this time.  - Reassess diarrhea at next visit, consider switching to Metformin ER -Recent lipid panel October 2020 : Elevated triglycerides -Taking simvastatin 20 mg; has room to increase this or switch to Crestor, will defer to PCP -Eye exam completed January 2020 -Foot exam up-to-date -GLP-1 agonist recommended for better control of diabetes and weight loss benefits - Follow up in 3 months for a1c  recheck   OSA (obstructive sleep apnea) Discussed results of sleep study with patient.  Patient has mild sleep apnea.  Patient trying to lose weight and is exercising 5 days a week for about an hour.  -Consider CPAP     Orders Placed This Encounter  Procedures  . HgB A1c    No orders of the defined types were placed in this encounter.   Health Maintenance reviewed -see vaccination record did not receive 1 year vaccinations.  This could be due to and not being provided in [place].  Reassess at next visit.  Katha Cabal, DO PGY-1, [facility] 10/13/2019 10:09 AM

## 2019-10-13 NOTE — Patient Instructions (Signed)
It was great seeing you today!   I'd like to see you back in about 6 months for follow up but if you need to be seen earlier than that for any new issues we're happy to fit you in, just give [place] a call!  Keep exercising or doing great!      If you have questions or concerns please do not hesitate to call at [phone].  Dr. Katherina Right [facility]

## 2019-10-14 ENCOUNTER — Telehealth: Payer: Self-pay

## 2019-10-14 ENCOUNTER — Ambulatory Visit (INDEPENDENT_AMBULATORY_CARE_PROVIDER_SITE_OTHER): Payer: Medicaid Other | Admitting: Medical

## 2019-10-14 ENCOUNTER — Encounter: Payer: Self-pay | Admitting: Medical

## 2019-10-14 VITALS — BP 127/85 | HR 86 | Ht 63.0 in | Wt 269.6 lb

## 2019-10-14 DIAGNOSIS — Z3042 Encounter for surveillance of injectable contraceptive: Secondary | ICD-10-CM | POA: Diagnosis not present

## 2019-10-14 DIAGNOSIS — N939 Abnormal uterine and vaginal bleeding, unspecified: Secondary | ICD-10-CM

## 2019-10-14 LAB — POCT PREGNANCY, URINE: Preg Test, Ur: NEGATIVE

## 2019-10-14 MED ORDER — MEDROXYPROGESTERONE ACETATE 150 MG/ML IM SUSP
150.0000 mg | INTRAMUSCULAR | Status: AC
  Administered 2019-10-14 – 2020-03-26 (×2): 150 mg via INTRAMUSCULAR

## 2019-10-14 MED ORDER — CALCIUM CARBONATE-VITAMIN D 500-200 MG-UNIT PO TABS: 1.0000 | ORAL_TABLET | Freq: Two times a day (BID) | ORAL | 11 refills | Status: AC

## 2019-10-14 NOTE — Progress Notes (Addendum)
Bone Density Scan scheduled with [facility] on 12/05/19 @ 8am.

## 2019-10-14 NOTE — Patient Instructions (Signed)
Bone Density Test The bone density test uses a special type of X-ray to measure the amount of calcium and other minerals in your bones. It can measure bone density in the hip and the spine. The test procedure is similar to having a regular X-ray. This test may also be called:  Bone densitometry.  Bone mineral density test.  Dual-energy X-ray absorptiometry (DEXA). You may have this test to:  Diagnose a condition that causes weak or thin bones (osteoporosis).  Screen you for osteoporosis.  Predict your risk for a broken bone (fracture).  Determine how well your osteoporosis treatment is working. Tell a health care provider about:  Any allergies you have.  All medicines you are taking, including vitamins, herbs, eye drops, creams, and over-the-counter medicines.  Any problems you or family members have had with anesthetic medicines.  Any blood disorders you have.  Any surgeries you have had.  Any medical conditions you have.  Whether you are pregnant or may be pregnant.  Any medical tests you have had within the past 14 days that used contrast material. What are the risks? Generally, this is a safe procedure. However, it does expose you to a small amount of radiation, which can slightly increase your cancer risk. What happens before the procedure?  Do not take any calcium supplements starting 24 hours before your test.  Remove all metal jewelry, eyeglasses, dental appliances, and any other metal objects. What happens during the procedure?   You will lie down on an exam table. There will be an X-ray generator below you and an imaging device above you.  Other devices, such as boxes or braces, may be used to position your body properly for the scan.  The machine will slowly scan your body. You will need to keep still.  The images will show up on a screen in the room. Images will be examined by a specialist after your test is done. The procedure may vary among health care  providers and hospitals. What happens after the procedure?  It is up to you to get your test results. Ask your health care provider, or the department that is doing the test, when your results will be ready. Summary  A bone density test is an imaging test that uses a type of X-ray to measure the amount of calcium and other minerals in your bones.  The test may be used to diagnose or screen you for a condition that causes weak or thin bones (osteoporosis), predict your risk for a broken bone (fracture), or determine how well your osteoporosis treatment is working.  Do not take any calcium supplements starting 24 hours before your test.  Ask your health care provider, or the department that is doing the test, when your results will be ready. This information is not intended to replace advice given to you by your health care provider. Make sure you discuss any questions you have with your health care provider. Document Revised: 09/03/2017 Document Reviewed: 06/22/2017 Elsevier Patient Education  [place]. Medroxyprogesterone injection [Contraceptive] What is this medicine? MEDROXYPROGESTERONE (me DROX ee proe JES te rone) contraceptive injections prevent pregnancy. They provide effective birth control for 3 months. Depo-subQ Provera 104 is also used for treating pain related to endometriosis. This medicine may be used for other purposes; ask your health care provider or pharmacist if you have questions. COMMON BRAND NAME(S): Depo-Provera, Depo-subQ Provera 104 What should I tell my health care provider before I take this medicine? They need to know  if you have any of these conditions:  frequently drink alcohol  asthma  blood vessel disease or a history of a blood clot in the lungs or legs  bone disease such as osteoporosis  breast cancer  diabetes  eating disorder (anorexia nervosa or bulimia)  high blood pressure  HIV infection or AIDS  kidney disease  liver  disease  mental depression  migraine  seizures (convulsions)  stroke  tobacco smoker  vaginal bleeding  an unusual or allergic reaction to medroxyprogesterone, other hormones, medicines, foods, dyes, or preservatives  pregnant or trying to get pregnant  breast-feeding How should I use this medicine? Depo-Provera Contraceptive injection is given into a muscle. Depo-subQ Provera 104 injection is given under the skin. These injections are given by a health care professional. You must not be pregnant before getting an injection. The injection is usually given during the first 5 days after the start of a menstrual period or 6 weeks after delivery of a baby. Talk to your pediatrician regarding the use of this medicine in children. Special care may be needed. These injections have been used in female children who have started having menstrual periods. Overdosage: If you think you have taken too much of this medicine contact a poison control center or emergency room at once. NOTE: This medicine is only for you. Do not share this medicine with others. What if I miss a dose? Try not to miss a dose. You must get an injection once every 3 months to maintain birth control. If you cannot keep an appointment, call and reschedule it. If you wait longer than 13 weeks between Depo-Provera contraceptive injections or longer than 14 weeks between Depo-subQ Provera 104 injections, you could get pregnant. Use another method for birth control if you miss your appointment. You may also need a pregnancy test before receiving another injection. What may interact with this medicine? Do not take this medicine with any of the following medications:  bosentan This medicine may also interact with the following medications:  aminoglutethimide  antibiotics or medicines for infections, especially rifampin, rifabutin, rifapentine, and griseofulvin  aprepitant  barbiturate medicines such as phenobarbital or  primidone  bexarotene  carbamazepine  medicines for seizures like ethotoin, felbamate, oxcarbazepine, phenytoin, topiramate  modafinil  St. John's wort This list may not describe all possible interactions. Give your health care provider a list of all the medicines, herbs, non-prescription drugs, or dietary supplements you use. Also tell them if you smoke, drink alcohol, or use illegal drugs. Some items may interact with your medicine. What should I watch for while using this medicine? This drug does not protect you against HIV infection (AIDS) or other sexually transmitted diseases. Use of this product may cause you to lose calcium from your bones. Loss of calcium may cause weak bones (osteoporosis). Only use this product for more than 2 years if other forms of birth control are not right for you. The longer you use this product for birth control the more likely you will be at risk for weak bones. Ask your health care professional how you can keep strong bones. You may have a change in bleeding pattern or irregular periods. Many females stop having periods while taking this drug. If you have received your injections on time, your chance of being pregnant is very low. If you think you may be pregnant, see your health care professional as soon as possible. Tell your health care professional if you want to get pregnant within the next year.  The effect of this medicine may last a long time after you get your last injection. What side effects may I notice from receiving this medicine? Side effects that you should report to your doctor or health care professional as soon as possible:  allergic reactions like skin rash, itching or hives, swelling of the face, lips, or tongue  breast tenderness or discharge  breathing problems  changes in vision  depression  feeling faint or lightheaded, falls  fever  pain in the abdomen, chest, groin, or leg  problems with balance, talking,  walking  unusually weak or tired  yellowing of the eyes or skin Side effects that usually do not require medical attention (report to your doctor or health care professional if they continue or are bothersome):  acne  fluid retention and swelling  headache  irregular periods, spotting, or absent periods  temporary pain, itching, or skin reaction at site where injected  weight gain This list may not describe all possible side effects. Call your doctor for medical advice about side effects. You may report side effects to FDA at 1-800-FDA-1088. Where should I keep my medicine? This does not apply. The injection will be given to you by a health care professional. NOTE: This sheet is a summary. It may not cover all possible information. If you have questions about this medicine, talk to your doctor, pharmacist, or health care provider.  2020 Elsevier/Gold Standard (2008-09-08 18:37:56)

## 2019-10-14 NOTE — Telephone Encounter (Signed)
Called pt to advise of Bone Density scan appt. [facility] at 8am, pt verbalized understanding.

## 2019-10-14 NOTE — Progress Notes (Signed)
History:  [name] is a 34 y.o. G1P0 who presents to clinic today as referred by [facility]. The patient states that she was on Depo Provera x 10 years previously without any issues. She was told by her PCP that she had to discontinue due to risk of bone density loss. She had Mirena IUD placed at [facility] and continued to have excessive bleeding. [place] at that time showed IUD was malpositioned so it was removed. She was referred here for IUD insertion, but would much prefer to restart the Depo Provera. She did not have periods on the Depo Provera.    The following portions of the patient's history were reviewed and updated as appropriate: allergies, current medications, family history, past medical history, social history, past surgical history and problem list.  Review of Systems:  Review of Systems  Constitutional: Negative for fever.  Gastrointestinal: Negative for abdominal pain.  Genitourinary:       Neg - vaginal bleeding      Objective:  Physical Exam BP 127/85   Pulse 86   Ht 5\' 3"  (1.6 m)   Wt 269 lb 9.6 oz (122.3 kg)   LMP 09/28/2019 (Approximate)   BMI 47.76 kg/m  Physical Exam  Nursing note and vitals reviewed. Constitutional: She is oriented to person, place, and time. She appears well-developed and well-nourished. No distress.  HENT:  Head: Normocephalic and atraumatic.  Cardiovascular: Normal rate.  Respiratory: Effort normal.  GI: Soft. She exhibits no distension.  Neurological: She is alert and oriented to person, place, and time.  Skin: Skin is warm and dry. No erythema.  Psychiatric: She has a normal mood and affect.      Labs and Imaging Results for orders placed or performed in visit on 10/14/19 (from the past 24 hour(s))  Pregnancy, urine POC     Status: None   Collection Time: 10/14/19  9:19 AM  Result Value Ref Range   Preg Test, Ur NEGATIVE NEGATIVE     Assessment & Plan:  1. Depo-Provera contraceptive status - calcium-vitamin D (OSCAL WITH D)  500-200 MG-UNIT tablet; Take 1 tablet by mouth 2 (two) times daily.  Dispense: 60 tablet; Refill: 11 - DG BONE DENSITY (DXA); Future  2. Abnormal uterine bleeding (AUB) - Restart Depo Provera today  - Return in 3 months for next dose as long as Dexa scan is normal   Follow-up with PCP as scheduled for management of other chronic medical problems   12/12/19 10/14/2019 9:40 AM

## 2019-10-16 ENCOUNTER — Other Ambulatory Visit: Payer: Self-pay | Admitting: Family Medicine

## 2019-10-16 DIAGNOSIS — G4733 Obstructive sleep apnea (adult) (pediatric): Secondary | ICD-10-CM

## 2019-10-17 ENCOUNTER — Other Ambulatory Visit: Payer: Self-pay | Admitting: Family Medicine

## 2019-10-17 DIAGNOSIS — E119 Type 2 diabetes mellitus without complications: Secondary | ICD-10-CM

## 2019-10-17 NOTE — Telephone Encounter (Signed)
Pt calling in need a refill on Simvastatin. Edison Pace

## 2019-10-21 NOTE — Telephone Encounter (Signed)
Patient referred for polysomnography study by cardiology. Per study results, patient requires CPAP. Due to wide variety of CPAPs available, will refer to pulmonology as they are best suited to prescribe appropriate CPAP based on sleep study results. Patient aware last week.  Shirlean Mylar, MD [facility], PGY-1

## 2019-10-25 ENCOUNTER — Encounter (HOSPITAL_COMMUNITY): Payer: Self-pay

## 2019-10-25 ENCOUNTER — Ambulatory Visit (HOSPITAL_COMMUNITY)
Admission: EM | Admit: 2019-10-25 | Discharge: 2019-10-25 | Disposition: A | Discharge status: Home / Self Care | Payer: Medicaid Other

## 2019-10-25 ENCOUNTER — Other Ambulatory Visit: Payer: Self-pay

## 2019-10-25 DIAGNOSIS — L739 Follicular disorder, unspecified: Secondary | ICD-10-CM

## 2019-10-25 DIAGNOSIS — R0982 Postnasal drip: Secondary | ICD-10-CM | POA: Diagnosis not present

## 2019-10-25 MED ORDER — CETIRIZINE HCL 10 MG PO TABS: 10.0000 mg | ORAL_TABLET | Freq: Every day | ORAL | 0 refills | Status: DC

## 2019-10-25 MED ORDER — BENZOYL PEROXIDE WASH 5 % EX LIQD: Freq: Two times a day (BID) | CUTANEOUS | 12 refills | Status: DC

## 2019-10-25 MED ORDER — MUPIROCIN CALCIUM 2 % EX CREA: 1.0000 "application " | TOPICAL_CREAM | Freq: Two times a day (BID) | CUTANEOUS | 0 refills | Status: DC

## 2019-10-25 NOTE — Discharge Instructions (Addendum)
I believe this is folliculitis.  Most of these will resolve on their own however I have sent in a benzyl peroxide wash and mupirocin cream to apply to the areas of concern twice a day.  If these become more frequent or significantly worsen please return to clinic for further evaluation.

## 2019-10-25 NOTE — ED Provider Notes (Signed)
[facility]    CSN: [id number] Arrival date & time: 10/25/19  0932      History   Chief Complaint Chief Complaint  Patient presents with  . Abscess    right breast    HPI Dorothy Haynes is a 34 y.o. female.   Reports urgent care today for concern of abscess on right breast.  She reports multiple small bumps on her right breast that been present for a few days.  She has applied toothpaste to the bumps and drained a few of them.  They are mildly painful.  Since she drained them they have not drained more fluid.  She reports having these previously and they have gone away after she drinks them.  She denies fever and chills.   She also ports a sensation of something in her throat at times when she wakes up.  She reports waking up with dry mouth and needing to immediately drink water most mornings.  She reports loud snoring and is being worked up for CPAP machine.  She denies that throat is sore but reports on sensation.  She does report having to clear her throat constantly in the morning.     Past Medical History:  Diagnosis Date  . Diverticulitis 03/15/2018  . GERD (gastroesophageal reflux disease) 2009  . Gestational diabetes 2008  . Headache   . History of gallstones 10/2015   s/p cholecystectomy 10/09/15  . Hypertension   . Morbid obesity ([place]) 10/09/2015  . Shortness of breath dyspnea   . Tobacco abuse 10/09/2015    Patient Active Problem List   Diagnosis Date Noted  . Uterine cramping 07/13/2019  . Hyperlipidemia 07/13/2019  . Physical deconditioning 06/22/2019  . Anemia 06/22/2019  . Plantar fasciitis, bilateral 06/02/2019  . Left arm pain 06/02/2019  . TMJ (dislocation of temporomandibular joint), initial encounter 06/02/2019  . Lymph node symptom 03/07/2019  . Type 2 diabetes mellitus without complication, without long-term current use of insulin ([place]) 03/22/2018  . OSA (obstructive sleep apnea) 03/22/2018  . Essential hypertension 02/08/2018  .  Menorrhagia with irregular cycle 10/23/2016  . Morbid obesity ([place]) 10/09/2015  . Tobacco abuse 10/09/2015    Past Surgical History:  Procedure Laterality Date  . CHOLECYSTECTOMY N/A 10/09/2015   Procedure: LAPAROSCOPIC CHOLECYSTECTOMY ;  Surgeon: Fanny Skates, MD;  Location: [facility];  Service: General;  Laterality: N/A;  . WISDOM TOOTH EXTRACTION      OB History    Gravida  1   Para      Term      Preterm      AB      Living  1     SAB      TAB      Ectopic      Multiple      Live Births  1            Home Medications    Prior to Admission medications   Medication Sig Start Date End Date Taking? Authorizing Provider  ACCU-CHEK SOFTCLIX LANCETS lancets Use as instructed Patient not taking: Reported on 10/14/2019 06/22/18   Bufford Lope, DO  benzoyl peroxide (BENZOYL PEROXIDE) 5 % external liquid Apply topically 2 (two) times daily. 10/25/19   Gissela Bloch, Marguerita Beards, PA-C  blood glucose meter kit and supplies KIT Dispense based on patient and insurance preference. Use up to four times daily as directed. (FOR ICD-9 250.00, 250.01). Patient not taking: Reported on 10/14/2019 05/05/18   Bonnita Hollow, MD  calcium-vitamin D (OSCAL WITH D) 500-200 MG-UNIT tablet Take 1 tablet by mouth 2 (two) times daily. 10/14/19   Luvenia Redden, PA-C  cetirizine (ZYRTEC ALLERGY) 10 MG tablet Take 1 tablet (10 mg total) by mouth daily. 10/25/19 11/24/19  Mikhail Hallenbeck, Marguerita Beards, PA-C  cyclobenzaprine (FLEXERIL) 10 MG tablet Take 10 mg by mouth at bedtime. 08/10/19   [provider]  glucose blood (ACCU-CHEK AVIVA) test strip 1 each by Other route as directed. Use as instructed Patient not taking: Reported on 10/14/2019 06/18/18   Bufford Lope, DO  hydrochlorothiazide (HYDRODIURIL) 12.5 MG tablet Take 1 tablet (12.5 mg total) by mouth daily. 03/25/19   Gladys Damme, MD  ibuprofen (ADVIL) 800 MG tablet Take 1 tablet (800 mg total) by mouth 2 (two) times daily. 08/19/19   Gladys Damme, MD    meloxicam (MOBIC) 15 MG tablet Take 15 mg by mouth daily. 08/10/19   [provider]  metFORMIN (GLUCOPHAGE) 1000 MG tablet Take 1 tablet (1,000 mg total) by mouth 2 (two) times daily with a meal. 09/13/19   Gladys Damme, MD  mupirocin cream (BACTROBAN) 2 % Apply 1 application topically 2 (two) times daily. 10/25/19   Darleene Cumpian, Marguerita Beards, PA-C  simvastatin (ZOCOR) 20 MG tablet TAKE 1 TABLET BY MOUTH AT BEDTIME 10/18/19   Gladys Damme, MD  traZODone (DESYREL) 50 MG tablet TAKE 1 TAB 1 HOUR BEFORE SLEEP ON DAY OF SLEEP STUDY. Patient not taking: Reported on 10/14/2019 11/15/18   [place], DO    Family History Family History  Problem Relation Age of Onset  . Diabetes Mother   . Hypertension Mother   . Cataracts Mother   . Throat cancer Father   . Kidney Stones Father   . Alzheimer's disease Other   . Diabetes Other   . Hypertension Other   . Heart disease Other   . Heart disease Other   . Diabetes Other   . Hypertension Other   . Thyroid disease Other   . Diabetes Maternal Grandmother   . Diabetes Maternal Grandfather   . Blindness Maternal Aunt   . Amblyopia Neg Hx   . Glaucoma Neg Hx   . Macular degeneration Neg Hx   . Retinal detachment Neg Hx   . Strabismus Neg Hx   . Retinitis pigmentosa Neg Hx     Social History Social History   Tobacco Use  . Smoking status: Former Smoker    Packs/day: 0.20    Years: 17.00    Pack years: 3.40    Types: Cigarettes    Quit date: 03/01/2018    Years since quitting: 1.6  . Smokeless tobacco: Never Used  Substance Use Topics  . Alcohol use: Yes    Comment: occ, 1-2 drink per week  . Drug use: No    Comment: previously used MJ, has been off recreational drugs     Allergies   Patient has no known allergies.   Review of Systems Review of Systems  Constitutional: Negative for chills and fever.  HENT: Negative for congestion, ear pain, postnasal drip, rhinorrhea, sinus pressure, sinus pain, sneezing and sore  throat.   Eyes: Negative for pain and visual disturbance.  Respiratory: Negative for cough and shortness of breath.   Cardiovascular: Negative for chest pain and palpitations.  Gastrointestinal: Negative for abdominal pain and vomiting.  Musculoskeletal: Negative for arthralgias, back pain and myalgias.  Skin: Negative for color change and rash.       Skin lesions on her  right breast.  Neurological: Negative for seizures and headaches.  All other systems reviewed and are negative.    Physical Exam Triage Vital Signs ED Triage Vitals  Enc Vitals Group     BP 10/25/19 1012 (!) 145/105     Pulse Rate 10/25/19 1012 85     Resp 10/25/19 1012 19     Temp 10/25/19 1012 98.4 F (36.9 C)     Temp Source 10/25/19 1012 Oral     SpO2 10/25/19 1012 93 %     Weight 10/25/19 1017 266 lb (120.7 kg)     Height --      Head Circumference --      Peak Flow --      Pain Score 10/25/19 1016 8     Pain Loc --      Pain Edu? --      Excl. in [place]? --    No data found.  Updated Vital Signs BP (!) 145/105 (BP Location: Left Arm)   Pulse 85   Temp 98.4 F (36.9 C) (Oral)   Resp 19   Wt 266 lb (120.7 kg)   LMP 09/28/2019 (Approximate)   SpO2 93%   BMI 47.12 kg/m   Visual Acuity Right Eye Distance:   Left Eye Distance:   Bilateral Distance:    Right Eye Near:   Left Eye Near:    Bilateral Near:     Physical Exam Vitals and nursing note reviewed.  Constitutional:      General: She is not in acute distress.    Appearance: Normal appearance. She is well-developed. She is not ill-appearing.  HENT:     Head: Normocephalic and atraumatic.     Mouth/Throat:     Mouth: Mucous membranes are moist.     Comments: Nonerythematous and nonswollen oropharynx.  Some evidence of postnasal drip present.  No exudates Eyes:     General: No scleral icterus.    Extraocular Movements: Extraocular movements intact.     Conjunctiva/sclera: Conjunctivae normal.     Pupils: Pupils are equal, round, and  reactive to light.  Cardiovascular:     Rate and Rhythm: Normal rate.  Pulmonary:     Effort: Pulmonary effort is normal. No respiratory distress.  Musculoskeletal:        General: Normal range of motion.     Cervical back: Neck supple.  Skin:    General: Skin is warm and dry.     Comments: 4-5 erythematous papules on her lateral right breast consistent with folliculitis.  No actively draining lesions.  Nontender to palpation.  No drainable lesion present.  She has similar lesions on her left breast.  Neurological:     General: No focal deficit present.     Mental Status: She is alert and oriented to person, place, and time.  Psychiatric:        Mood and Affect: Mood normal.        Behavior: Behavior normal.        Thought Content: Thought content normal.        Judgment: Judgment normal.      UC Treatments / Results  Labs (all labs ordered are listed, but only abnormal results are displayed) Labs Reviewed - No data to display  EKG   Radiology No results found.  Procedures Procedures (including critical care time)  Medications Ordered in UC Medications - No data to display  Initial Impression / Assessment and Plan / UC Course  I have reviewed the triage  vital signs and the nursing notes.  Pertinent labs & imaging results that were available during my care of the patient were reviewed by me and considered in my medical decision making (see chart for details).     #Folliculitis #Postnasal drip Patient is a 34 year old female presenting with symptoms consistent with folliculitis of both breasts.  There are approximately 4-5 lesions on both breasts.  Considering this is a mild case and has apparently resolved on its own in the past will treat with topical cleansing agent benzyl peroxide and mupirocin antibiotic cream.  Her concerns about her throat appear to be related to dry mouth, postnasal drip and likely due to snoring.  I reassured her that there is nothing serious  to worry about today.  Did recommend that she begin Zyrtec to help with postnasal drip that may be secondary to nasal allergies.   Final Clinical Impressions(s) / UC Diagnoses   Final diagnoses:  Folliculitis  Post-nasal drip     Discharge Instructions     I believe this is folliculitis.  Most of these will resolve on their own however I have sent in a benzyl peroxide wash and mupirocin cream to apply to the areas of concern twice a day.  If these become more frequent or significantly worsen please return to clinic for further evaluation.     ED Prescriptions    Medication Sig Dispense Auth. Provider   benzoyl peroxide (BENZOYL PEROXIDE) 5 % external liquid Apply topically 2 (two) times daily. 142 g Pope Brunty, Marguerita Beards, PA-C   mupirocin cream (BACTROBAN) 2 % Apply 1 application topically 2 (two) times daily. 15 g Vibha Ferdig, Marguerita Beards, PA-C   cetirizine (ZYRTEC ALLERGY) 10 MG tablet Take 1 tablet (10 mg total) by mouth daily. 30 tablet Franshesca Chipman, Marguerita Beards, PA-C     PDMP not reviewed this encounter.   Purnell Shoemaker, PA-C 10/25/19 1048

## 2019-10-25 NOTE — ED Triage Notes (Signed)
Pt is here with an abscess on her right breast that started 4 days ago. Pt has used toothpaste on her abscess to relieve discomfort.

## 2019-10-26 ENCOUNTER — Ambulatory Visit: Payer: Medicaid Other | Admitting: Pulmonary Disease

## 2019-10-26 ENCOUNTER — Encounter: Payer: Self-pay | Admitting: Pulmonary Disease

## 2019-10-26 DIAGNOSIS — I10 Essential (primary) hypertension: Secondary | ICD-10-CM

## 2019-10-26 DIAGNOSIS — G4733 Obstructive sleep apnea (adult) (pediatric): Secondary | ICD-10-CM | POA: Diagnosis not present

## 2019-10-26 NOTE — Patient Instructions (Signed)
You have mild obstructive sleep apnea-you stop breathing 10 times an hour in your sleep.  Prescription for auto CPAP 5 to 15 cm with nasal mask will be sent to DME Expectation would be that you use this for at least 6 hours every night

## 2019-10-26 NOTE — Addendum Note (Signed)
Addended by: Benjie Karvonen R on: 10/26/2019 04:18 PM   Modules accepted: Orders

## 2019-10-26 NOTE — Assessment & Plan Note (Signed)
Effect on hypertension and cardiovascular effects of OSA were discussed

## 2019-10-26 NOTE — Progress Notes (Signed)
Subjective:    Patient ID: Dorothy Haynes, female    DOB: 08-21-1986, 34 y.o.   MRN: [id number]  HPI  Chief Complaint  Patient presents with  . Consult    Patient is here to establish care for sleep apnea. Patient had a sleep study done on 11/16/2018. Patient needs a cpap machine.    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 She is currently on furlough, went for DOT physical complain of snoring and was asked to undergo sleep evaluation.  She actually had a sleep study in 10/2018 but was not given results until recently. She reports that she has always been told by family members that she snores "like a grizzly bear" Epworth sleepiness score is 7 and she reports sleepiness in some social situations but denies any problems driving.  Bedtime is between midnight and 1 AM, sleep latency is minimal, she sleeps mostly on her side or on her stomach with 1 pillow, never on her back, reports 1-2 nocturnal awakenings for nocturia and is out of bed by 8 AM feeling rested without dryness of mouth or headaches There is no history suggestive of cataplexy, sleep paralysis or parasomnias   She had Covid infection in last week of January and is fully recover She quit smoking 03/2018 less than 15 pack years  Significant tests/ events reviewed  N PSG 10/2018 -weight 2 7 2  pounds-AHI 10/hour, lowest desaturation 85%, supine AHI 13/hour, REM AHI 51/hour  Past Medical History:  Diagnosis Date  . Diverticulitis 03/15/2018  . GERD (gastroesophageal reflux disease) 2009  . Gestational diabetes 2008  . Headache   . History of gallstones 10/2015   s/p cholecystectomy 10/09/15  . Hypertension   . Morbid obesity ([place]) 10/09/2015  . Shortness of breath dyspnea   . Tobacco abuse 10/09/2015    Past Surgical History:  Procedure Laterality Date  . CHOLECYSTECTOMY N/A 10/09/2015   Procedure: LAPAROSCOPIC CHOLECYSTECTOMY ;  Surgeon: Fanny Skates, MD;   Location: [facility];  Service: General;  Laterality: N/A;  . WISDOM TOOTH EXTRACTION       No Known Allergies  Social History   Socioeconomic History  . Marital status: Single    Spouse name: Not on file  . Number of children: 1  . Years of education: college graduate  . Highest education level: Not on file  Occupational History    Employer: [employer]    Comment: part-time  Tobacco Use  . Smoking status: Former Smoker    Packs/day: 0.20    Years: 17.00    Pack years: 3.40    Types: Cigarettes    Quit date: 03/01/2018    Years since quitting: 1.6  . Smokeless tobacco: Never Used  Substance and Sexual Activity  . Alcohol use: Yes    Comment: occ, 1-2 drink per week  . Drug use: No    Comment: previously used MJ, has been off recreational drugs  . Sexual activity: Yes    Partners: Male    Birth control/protection: Injection  Other Topics Concern  . Not on file  Social History Narrative   Lives with mother and son   Social Determinants of Health   Financial Resource Strain:   . Difficulty of Paying Living Expenses: Not on file  Food Insecurity:   . Worried About Charity fundraiser in the Last Year: Not on file  . Ran Out of Food in the Last Year: Not on file  Transportation  Needs:   . Lack of Transportation (Medical): Not on file  . Lack of Transportation (Non-Medical): Not on file  Physical Activity:   . Days of Exercise per Week: Not on file  . Minutes of Exercise per Session: Not on file  Stress:   . Feeling of Stress : Not on file  Social Connections:   . Frequency of Communication with Friends and Family: Not on file  . Frequency of Social Gatherings with Friends and Family: Not on file  . Attends Religious Services: Not on file  . Active Member of Clubs or Organizations: Not on file  . Attends Banker Meetings: Not on file  . Marital Status: Not on file  Intimate Partner Violence:   . Fear of Current or Ex-Partner: Not on file  .  Emotionally Abused: Not on file  . Physically Abused: Not on file  . Sexually Abused: Not on file      Family History  Problem Relation Age of Onset  . Diabetes Mother   . Hypertension Mother   . Cataracts Mother   . Throat cancer Father   . Kidney Stones Father   . Alzheimer's disease Other   . Diabetes Other   . Hypertension Other   . Heart disease Other   . Heart disease Other   . Diabetes Other   . Hypertension Other   . Thyroid disease Other   . Diabetes Maternal Grandmother   . Diabetes Maternal Grandfather   . Blindness Maternal Aunt   . Amblyopia Neg Hx   . Glaucoma Neg Hx   . Macular degeneration Neg Hx   . Retinal detachment Neg Hx   . Strabismus Neg Hx   . Retinitis pigmentosa Neg Hx        Review of Systems Constitutional: negative for anorexia, fevers and sweats  Eyes: negative for irritation, redness and visual disturbance  Ears, nose, mouth, throat, and face: negative for earaches, epistaxis, nasal congestion and sore throat  Respiratory: negative for cough, dyspnea on exertion, sputum and wheezing  Cardiovascular: negative for chest pain, dyspnea, lower extremity edema, orthopnea, palpitations and syncope  Gastrointestinal: negative for abdominal pain, constipation, diarrhea, melena, nausea and vomiting  Genitourinary:negative for dysuria, frequency and hematuria  Hematologic/lymphatic: negative for bleeding, easy bruising and lymphadenopathy  Musculoskeletal:negative for arthralgias, muscle weakness and stiff joints  Neurological: negative for coordination problems, gait problems, headaches and weakness  Endocrine: negative for diabetic symptoms including polydipsia, polyuria and weight loss     Objective:   Physical Exam  Gen. Pleasant, obese, in no distress, normal affect ENT - no pallor,icterus, no post nasal drip, class 2 airway, long uvula Neck: No JVD, no thyromegaly, no carotid bruits Lungs: no use of accessory muscles, no dullness to  percussion, decreased without rales or rhonchi  Cardiovascular: Rhythm regular, heart sounds  normal, no murmurs or gallops, no peripheral edema Abdomen: soft and non-tender, no hepatosplenomegaly, BS normal. Musculoskeletal: No deformities, no cyanosis or clubbing Neuro:  alert, non focal, no tremors       Assessment & Plan:

## 2019-10-26 NOTE — Assessment & Plan Note (Signed)
mild obstructive sleep apnea-AHI10 times an hour in your sleep.   The pathophysiology of obstructive sleep apnea , it's cardiovascular consequences & modes of treatment including CPAP were discused with the patient in detail & they evidenced understanding.   Prescription for auto CPAP 5 to 15 cm with nasal mask will be sent to DME  Weight loss encouraged, compliance with goal of at least 4-6 hrs every night is the expectation. Advised against medications with sedative side effects Cautioned against driving when sleepy - understanding that sleepiness will vary on a day to day basis

## 2019-10-26 NOTE — Assessment & Plan Note (Signed)
Weight loss encouraged. She has fully recovered from Covid infection.

## 2019-11-02 ENCOUNTER — Telehealth: Payer: Self-pay | Admitting: Pulmonary Disease

## 2019-11-02 NOTE — Telephone Encounter (Signed)
Called and left a message for [employer] to advise the order was issued on 2/24/231 and received by their agency on 10/28/19. Requested they contact the patient to make her aware when she can expect to receive the CPAP machine and to call our office if there are any further questions.  Called the patient to make her aware [employer] was called and they should be in contact with her once they have obtained approval from her insurance as they just started working on the request on Friday 10/28/19.  Patient voiced understanding. Nothing further needed at this time.

## 2019-11-04 ENCOUNTER — Ambulatory Visit: Payer: Medicaid Other | Attending: Internal Medicine

## 2019-11-04 DIAGNOSIS — Z20822 Contact with and (suspected) exposure to covid-19: Secondary | ICD-10-CM | POA: Diagnosis not present

## 2019-11-05 ENCOUNTER — Encounter: Payer: Self-pay | Admitting: Family Medicine

## 2019-11-05 LAB — NOVEL CORONAVIRUS, NAA: SARS-CoV-2, NAA: NOT DETECTED

## 2019-11-20 IMAGING — CT CT ABD-PELV W/ CM
2 of 4 series · 16 of 46 positions shown, 18 images · IV contrast (ISOVUE)
Comparison: 03/15/2018

CLINICAL DATA: Abdominal pain, back pain, nausea and diarrhea.
History of recent diverticulitis.

EXAM:
CT ABDOMEN AND PELVIS WITH CONTRAST
TECHNIQUE: Multidetector CT imaging of the abdomen and pelvis was performed
using the standard protocol following bolus administration of
intravenous contrast.
CONTRAST:  100mL G08LNI-QII IOPAMIDOL (G08LNI-QII) INJECTION 61%

[Series 2: axial st · axial · 0.82mm/px · z∈[-396,+38]mm · 13 of 99 slices shown, 15 images]
[im 6/99  soft-tissue]
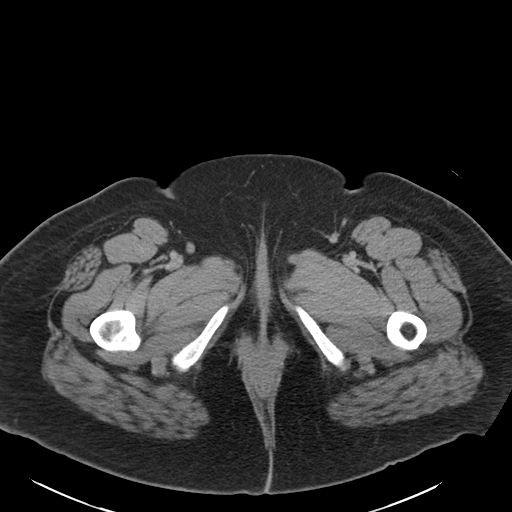
[im 6/99  bone]
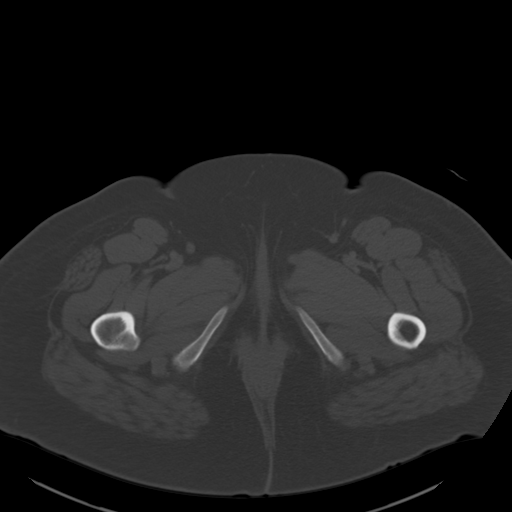
[im 11/99  soft-tissue]
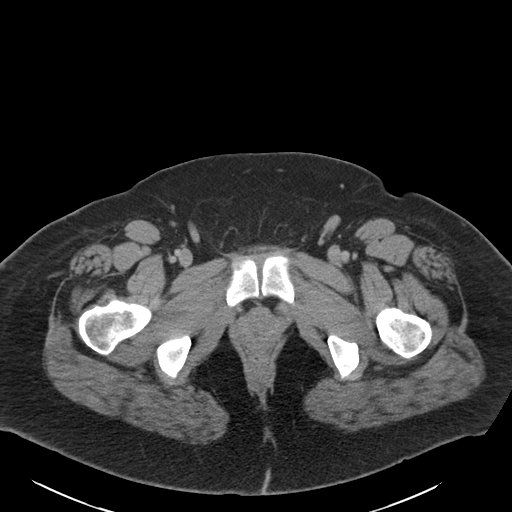
[im 22/99  soft-tissue]
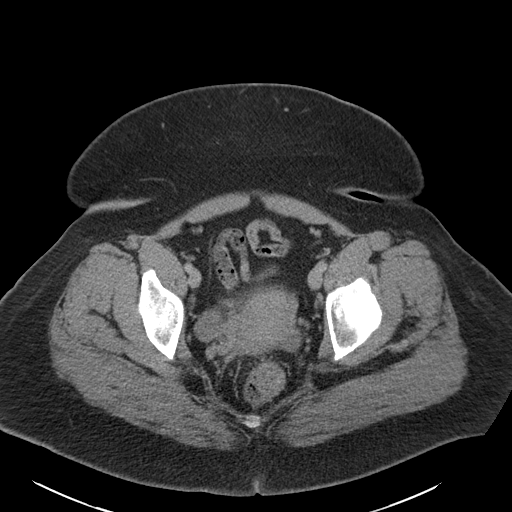
[im 28/99  soft-tissue]
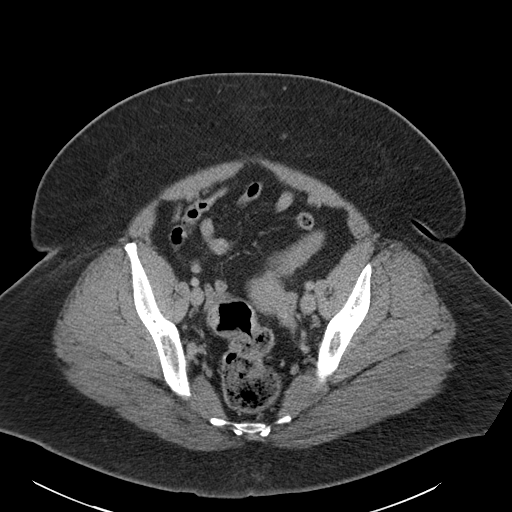
[im 33/99  soft-tissue]
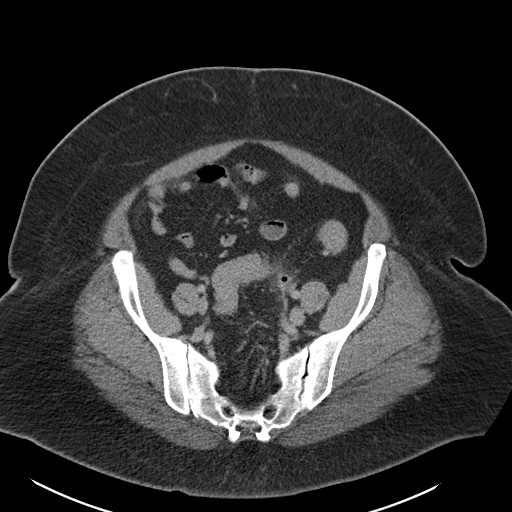
[im 44/99  soft-tissue]
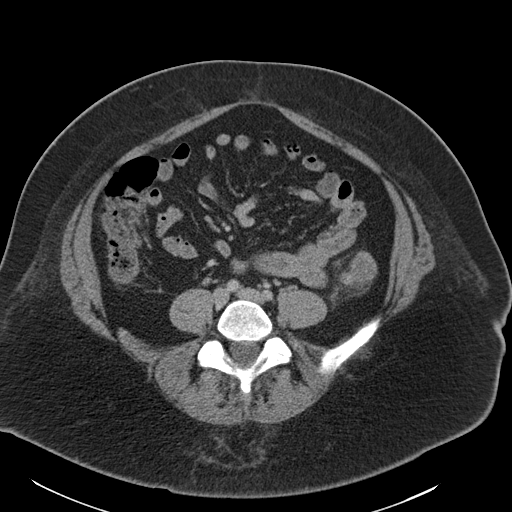
[im 50/99  soft-tissue]
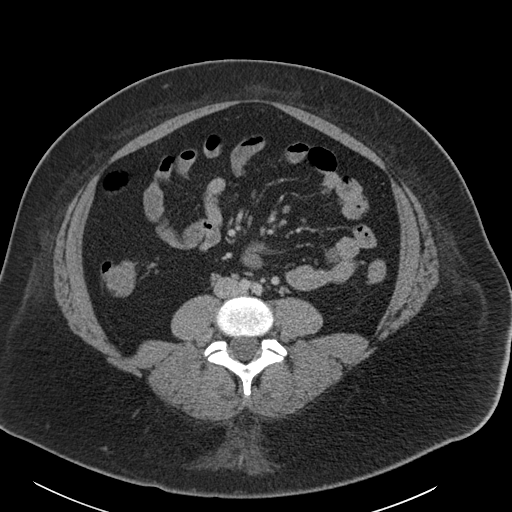
[im 55/99  soft-tissue]
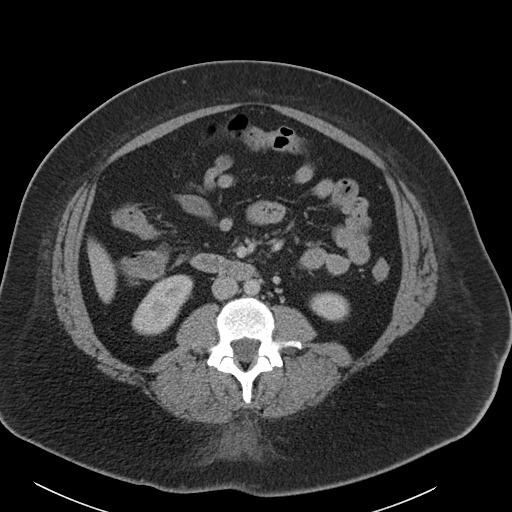
[im 66/99  soft-tissue]
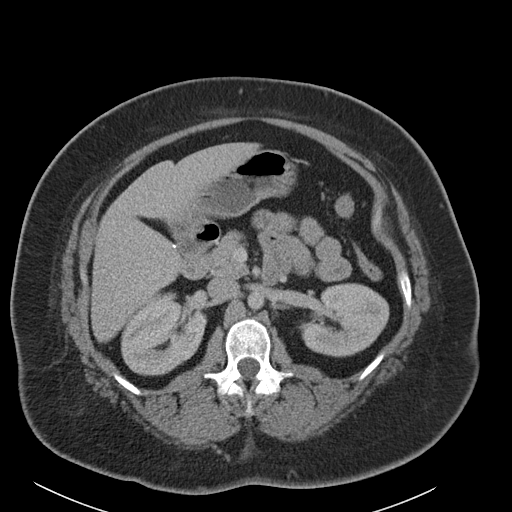
[im 66/99  bone]
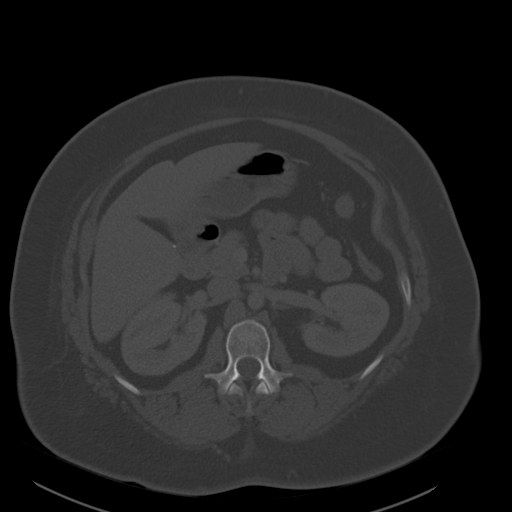
[im 71/99  soft-tissue]
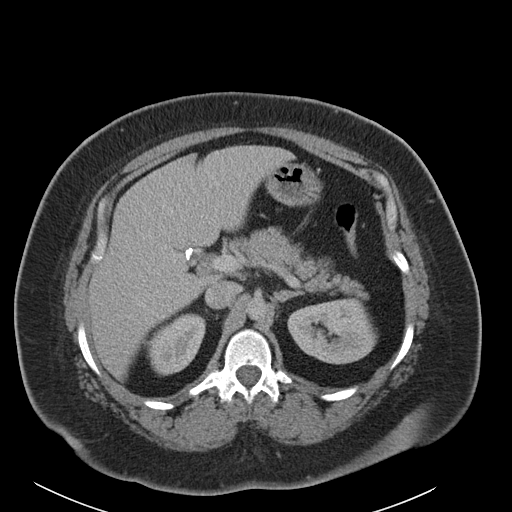
[im 77/99  soft-tissue]
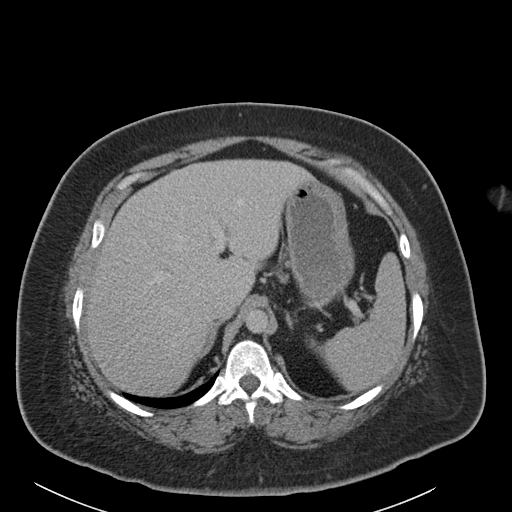
[im 88/99  soft-tissue]
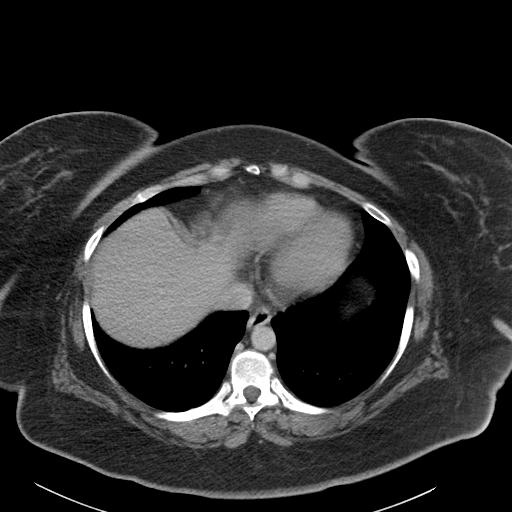
[im 93/99  soft-tissue]
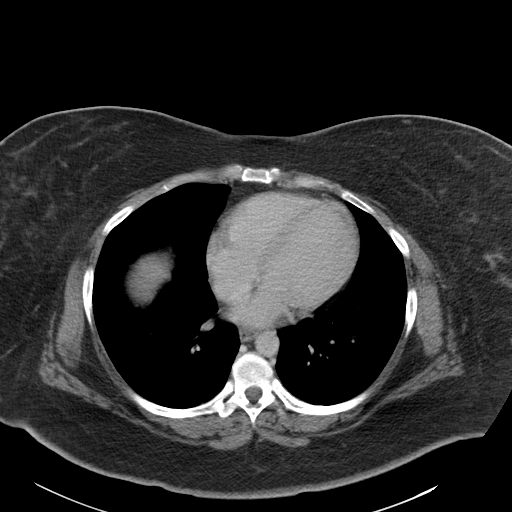

[Series 5: coronal st · coronal · 0.78mm/px · 3 of 123 slices shown]
[im 41/123  soft-tissue]
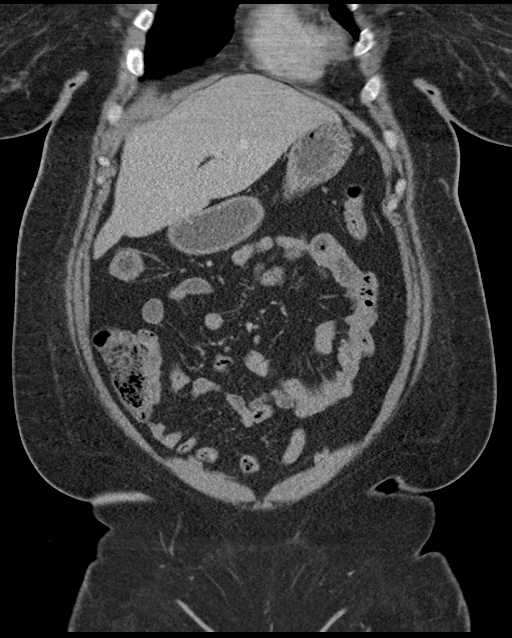
[im 55/123  soft-tissue]
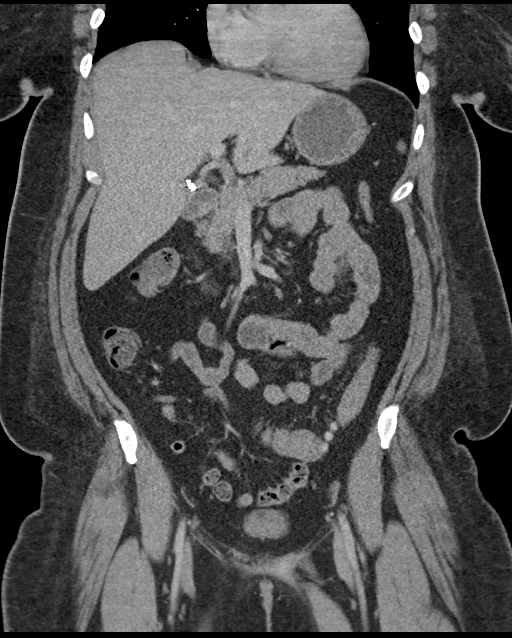
[im 68/123  soft-tissue]
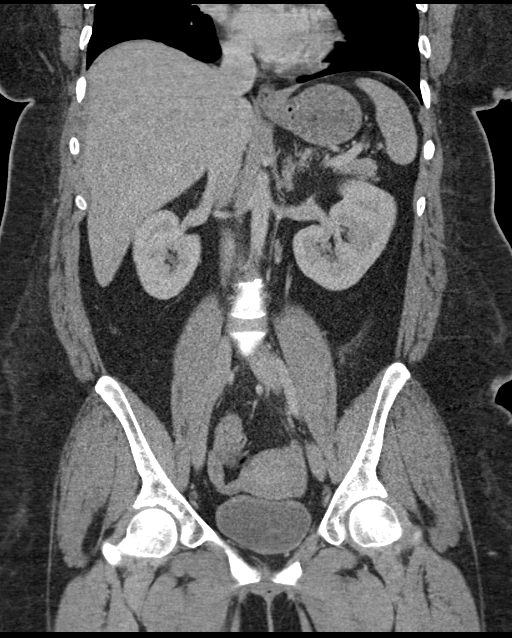

[16 of 46 positions shown; findings below may reference images not displayed]

FINDINGS: Lower chest: Normal heart size.  Clear lung bases.

Hepatobiliary: No focal liver abnormality is seen. Status post
cholecystectomy. No biliary dilatation.

Pancreas: Unremarkable. No pancreatic ductal dilatation or
surrounding inflammatory changes.

Spleen: Normal in size without focal abnormality.

Adrenals/Urinary Tract: Adrenal glands are unremarkable. Kidneys are
normal, without renal calculi, focal lesion, or hydronephrosis.
Bladder is unremarkable.

Stomach/Bowel: Small inflamed diverticulum off the mid sigmoid is
noted consistent with acute uncomplicated sigmoid diverticulitis. No
bowel obstruction or free air.

Vascular/Lymphatic: No significant vascular findings are present. No
enlarged abdominal or pelvic lymph nodes.

Reproductive: Uterus and bilateral adnexa are unremarkable. Probable
small corpus luteal cyst of the left ovary measuring 18 mm with
minimal peripheral enhancement.

Other: No abdominopelvic ascites.

Musculoskeletal: No acute or significant osseous findings.
IMPRESSION: A single inflamed sigmoid diverticulum is noted consistent with
acute uncomplicated sigmoid diverticulitis.

## 2019-11-23 DIAGNOSIS — G4733 Obstructive sleep apnea (adult) (pediatric): Secondary | ICD-10-CM | POA: Diagnosis not present

## 2019-12-01 DIAGNOSIS — G4733 Obstructive sleep apnea (adult) (pediatric): Secondary | ICD-10-CM | POA: Diagnosis not present

## 2019-12-05 ENCOUNTER — Ambulatory Visit
Admission: RE | Admit: 2019-12-05 | Discharge: 2019-12-05 | Disposition: A | Discharge status: Home / Self Care | Payer: Medicaid Other | Source: Ambulatory Visit | Attending: Medical | Admitting: Medical

## 2019-12-05 ENCOUNTER — Other Ambulatory Visit: Payer: Self-pay

## 2019-12-05 DIAGNOSIS — Z3042 Encounter for surveillance of injectable contraceptive: Secondary | ICD-10-CM

## 2019-12-05 DIAGNOSIS — Z13820 Encounter for screening for osteoporosis: Secondary | ICD-10-CM | POA: Diagnosis not present

## 2019-12-07 ENCOUNTER — Encounter: Payer: Self-pay | Admitting: Adult Health

## 2019-12-07 ENCOUNTER — Other Ambulatory Visit: Payer: Self-pay

## 2019-12-07 ENCOUNTER — Ambulatory Visit: Payer: Medicaid Other | Admitting: Adult Health

## 2019-12-07 DIAGNOSIS — G4733 Obstructive sleep apnea (adult) (pediatric): Secondary | ICD-10-CM

## 2019-12-07 NOTE — Progress Notes (Signed)
_0  ID: Dorothy Haynes, female    DOB: 04/21/1986, 34 y.o.   MRN: [id number]  Chief Complaint  Patient presents with  . Follow-up    OSA     Referring provider: Gladys Damme, MD  HPI: 34 year old female seen for sleep consult October 26, 2019 for evaluation of obstructive sleep apnea Difficult for hypertension and diabetes   TEST/EVENTS :  N PSG 10/2018 -weight _1 pounds-AHI 10/hour, lowest desaturation 85%, supine AHI 13/hour, REM AHI 51/hour  12/07/2019 Follow up : OSA  Patient presents for a follow-up visit for sleep apnea.  Patient was recently started on CPAP for mild sleep apnea.  She says she received her machine about 10 days ago.  She says she is trying to wear it each night.  She does have trouble sometimes in the middle of the night and takes off her mask.  We discussed alternative mask that might be more comfortable CPAP download shows excellent compliance over the last 10 days with daily average usage at 3.5 hours.  AHI 1.8.  Patient is on CPAP AutoSet 5 to 15 cm H2O.  Daily average pressure 13 cm H2O.   No Known Allergies  Immunization History  Administered Date(s) Administered  . Influenza,inj,Quad PF,6+ Mos 07/21/2018  . Tdap 05/28/2016    Past Medical History:  Diagnosis Date  . Diverticulitis 03/15/2018  . GERD (gastroesophageal reflux disease) 2009  . Gestational diabetes 2008  . Headache   . History of gallstones 10/2015   s/p cholecystectomy 10/09/15  . Hypertension   . Morbid obesity ([place]) 10/09/2015  . Shortness of breath dyspnea   . Tobacco abuse 10/09/2015    Tobacco History: Social History   Tobacco Use  Smoking Status Former Smoker  . Packs/day: 0.20  . Years: 17.00  . Pack years: 3.40  . Types: Cigarettes  . Quit date: 03/01/2018  . Years since quitting: 1.7  Smokeless Tobacco Never Used   Counseling given: Not Answered   Outpatient Medications Prior to Visit  Medication Sig Dispense Refill  . ACCU-CHEK SOFTCLIX LANCETS  lancets Use as instructed 100 each 12  . blood glucose meter kit and supplies KIT Dispense based on patient and insurance preference. Use up to four times daily as directed. (FOR ICD-9 250.00, 250.01). 1 each 0  . calcium-vitamin D (OSCAL WITH D) 500-200 MG-UNIT tablet Take 1 tablet by mouth 2 (two) times daily. 60 tablet 11  . glucose blood (ACCU-CHEK AVIVA) test strip 1 each by Other route as directed. Use as instructed 100 each 12  . hydrochlorothiazide (HYDRODIURIL) 12.5 MG tablet Take 1 tablet (12.5 mg total) by mouth daily. 90 tablet 3  . metFORMIN (GLUCOPHAGE) 1000 MG tablet Take 1 tablet (1,000 mg total) by mouth 2 (two) times daily with a meal. 180 tablet 3  . simvastatin (ZOCOR) 20 MG tablet TAKE 1 TABLET BY MOUTH AT BEDTIME 30 tablet 3  . benzoyl peroxide (BENZOYL PEROXIDE) 5 % external liquid Apply topically 2 (two) times daily. 142 g 12  . cetirizine (ZYRTEC ALLERGY) 10 MG tablet Take 1 tablet (10 mg total) by mouth daily. 30 tablet 0  . cyclobenzaprine (FLEXERIL) 10 MG tablet Take 10 mg by mouth at bedtime.    Marland Kitchen ibuprofen (ADVIL) 800 MG tablet Take 1 tablet (800 mg total) by mouth 2 (two) times daily. 30 tablet 0  . meloxicam (MOBIC) 15 MG tablet Take 15 mg by mouth daily.    . mupirocin cream (BACTROBAN) 2 % Apply 1  application topically 2 (two) times daily. 15 g 0  . traZODone (DESYREL) 50 MG tablet TAKE 1 TAB 1 HOUR BEFORE SLEEP ON DAY OF SLEEP STUDY. 1 tablet 0   Facility-Administered Medications Prior to Visit  Medication Dose Route Frequency Provider Last Rate Last Admin  . medroxyPROGESTERone (DEPO-PROVERA) injection 150 mg  150 mg Intramuscular Q90 days Luvenia Redden, PA-C   150 mg at 10/14/19 0945     Review of Systems:   Constitutional:   No  weight loss, night sweats,  Fevers, chills, fatigue, or  lassitude.  HEENT:   No headaches,  Difficulty swallowing,  Tooth/dental problems, or  Sore throat,                No sneezing, itching, ear ache, nasal congestion, post  nasal drip,   CV:  No chest pain,  Orthopnea, PND, swelling in lower extremities, anasarca, dizziness, palpitations, syncope.   GI  No heartburn, indigestion, abdominal pain, nausea, vomiting, diarrhea, change in bowel habits, loss of appetite, bloody stools.   Resp: No shortness of breath with exertion or at rest.  No excess mucus, no productive cough,  No non-productive cough,  No coughing up of blood.  No change in color of mucus.  No wheezing.  No chest wall deformity  Skin: no rash or lesions.  GU: no dysuria, change in color of urine, no urgency or frequency.  No flank pain, no hematuria   MS:  No joint pain or swelling.  No decreased range of motion.  No back pain.    Physical Exam  BP 122/76 (BP Location: Left Arm, Patient Position: Sitting, Cuff Size: Large)   Pulse 97   Temp 97.9 F (36.6 C) (Temporal)   Ht _0  (1.6 m)   Wt 263 lb 6.4 oz (119.5 kg)   SpO2 99% Comment: room air  BMI 46.66 kg/m   GEN: A/Ox3; pleasant , NAD, BMI 46   HEENT:  [place]/AT,.   NECK:  Supple w/ fair ROM; no JVD; normal carotid impulses w/o bruits; no thyromegaly or nodules palpated; no lymphadenopathy.    RESP  Clear  P & A; w/o, wheezes/ rales/ or rhonchi. no accessory muscle use, no dullness to percussion  CARD:  RRR, no m/r/g, no peripheral edema, pulses intact, no cyanosis or clubbing.  GI:   Soft & nt; nml bowel sounds; no organomegaly or masses detected.   Musco: Warm bil, no deformities or joint swelling noted.   Neuro: alert, no focal deficits noted.    Skin: Warm, no lesions or rashes    Lab Results:   BNP No results found for: BNP  ProBNP No results found for: PROBNP  Imaging:   No flowsheet data found.  No results found for: NITRICOXIDE      Assessment & Plan:   OSA (obstructive sleep apnea) Patient has mild sleep apnea has recently started on CPAP.  She is off to good start.  Discussed helpful hints for her CPAP.  Patient will contact her DME company  to look at alternative mask.  Encouraged on increased daily usage.  Plan  Patient Instructions  Wear CPAP each night , goal is to wear for at least 4hrs each night  Work on healthy weight.  Do not drive if sleepy  Follow up in 6-8 weeks and As needed        Morbid obesity ([place]) Healthy weight loss.      Rexene Edison, NP 12/07/2019

## 2019-12-07 NOTE — Assessment & Plan Note (Signed)
Patient has mild sleep apnea has recently started on CPAP.  She is off to good start.  Discussed helpful hints for her CPAP.  Patient will contact her DME company to look at alternative mask.  Encouraged on increased daily usage.  Plan  Patient Instructions  Wear CPAP each night , goal is to wear for at least 4hrs each night  Work on healthy weight.  Do not drive if sleepy  Follow up in 6-8 weeks and As needed

## 2019-12-07 NOTE — Assessment & Plan Note (Signed)
Healthy weight loss 

## 2019-12-07 NOTE — Patient Instructions (Signed)
Wear CPAP each night , goal is to wear for at least 4hrs each night  Work on healthy weight.  Do not drive if sleepy  Follow up in 6-8 weeks and As needed

## 2019-12-23 DIAGNOSIS — G4733 Obstructive sleep apnea (adult) (pediatric): Secondary | ICD-10-CM | POA: Diagnosis not present

## 2019-12-27 ENCOUNTER — Telehealth: Payer: Self-pay

## 2019-12-27 NOTE — Telephone Encounter (Signed)
Pt called and stated that she has been having vaginal spotting for the past 5 weeks and this is her first Depo Provera.  I called pt and informed pt that it does take up to three injections before she sees a difference in her bleeding.  I advised pt that if she starts soaking a pad an hour to please give the office a call or go to Urgent Care.  However it does take some time for her body to get used to the injection.  Pt verbalized understanding.  Addison Naegeli, RN

## 2019-12-31 DIAGNOSIS — G4733 Obstructive sleep apnea (adult) (pediatric): Secondary | ICD-10-CM | POA: Diagnosis not present

## 2020-01-06 ENCOUNTER — Other Ambulatory Visit: Payer: Self-pay

## 2020-01-06 ENCOUNTER — Ambulatory Visit (INDEPENDENT_AMBULATORY_CARE_PROVIDER_SITE_OTHER): Payer: Medicaid Other | Admitting: *Deleted

## 2020-01-06 VITALS — BP 135/96 | HR 83 | Temp 98.2°F | Ht 64.0 in | Wt 266.5 lb

## 2020-01-06 DIAGNOSIS — Z3042 Encounter for surveillance of injectable contraceptive: Secondary | ICD-10-CM

## 2020-01-06 MED ORDER — MEDROXYPROGESTERONE ACETATE 150 MG/ML IM SUSP
150.0000 mg | Freq: Once | INTRAMUSCULAR | Status: AC
  Administered 2020-01-06: 150 mg via INTRAMUSCULAR

## 2020-01-06 NOTE — Progress Notes (Addendum)
Depo Provera 150 mg administered as scheduled. Pt tolerated well. Next injection due 7/23-8/6.   Chart reviewed for nurse visit. Agree with plan of care.   Currie Paris, NP 01/09/2020 7:13 PM

## 2020-01-13 ENCOUNTER — Other Ambulatory Visit: Payer: Self-pay

## 2020-01-13 ENCOUNTER — Encounter: Payer: Self-pay | Admitting: Family Medicine

## 2020-01-13 ENCOUNTER — Ambulatory Visit (INDEPENDENT_AMBULATORY_CARE_PROVIDER_SITE_OTHER): Payer: Medicaid Other | Admitting: Family Medicine

## 2020-01-13 VITALS — BP 132/88 | HR 98 | Ht 64.0 in | Wt 266.6 lb

## 2020-01-13 DIAGNOSIS — E119 Type 2 diabetes mellitus without complications: Secondary | ICD-10-CM | POA: Diagnosis not present

## 2020-01-13 DIAGNOSIS — I10 Essential (primary) hypertension: Secondary | ICD-10-CM | POA: Diagnosis not present

## 2020-01-13 LAB — POCT GLYCOSYLATED HEMOGLOBIN (HGB A1C): HbA1c, POC (controlled diabetic range): 6.5 % (ref 0.0–7.0)

## 2020-01-13 MED ORDER — METFORMIN HCL ER 500 MG PO TB24: 500.0000 mg | ORAL_TABLET | Freq: Every day | ORAL | 3 refills | Status: DC

## 2020-01-13 MED ORDER — METFORMIN HCL ER 500 MG PO TB24: 1000.0000 mg | ORAL_TABLET | Freq: Two times a day (BID) | ORAL | 3 refills | Status: DC

## 2020-01-13 NOTE — Patient Instructions (Signed)
It was a pleasure to see you today!  I do recommend getting the COVID-19 vaccine to prevent ending up in the hospital yourself and preventing others around you from getting sick.  For your diabetes: I switched you to metformin extended release. Take 2 pills in the morning, 2 at night. We will check in on your A1c, htn, weight loss in 3 months.  I also referred you to medical weight and wellness to discuss weight loss options and get more support to reach your goals of exercising more and eating better.  Be Well!   Healthy Eating Following a healthy eating pattern may help you to achieve and maintain a healthy body weight, reduce the risk of chronic disease, and live a long and productive life. It is important to follow a healthy eating pattern at an appropriate calorie level for your body. Your nutritional needs should be met primarily through food by choosing a variety of nutrient-rich foods. What are tips for following this plan? Reading food labels  Read labels and choose the following: ? Reduced or low sodium. ? Juices with 100% fruit juice. ? Foods with low saturated fats and high polyunsaturated and monounsaturated fats. ? Foods with whole grains, such as whole wheat, cracked wheat, brown rice, and wild rice. ? Whole grains that are fortified with folic acid. This is recommended for women who are pregnant or who want to become pregnant.  Read labels and avoid the following: ? Foods with a lot of added sugars. These include foods that contain brown sugar, corn sweetener, corn syrup, dextrose, fructose, glucose, high-fructose corn syrup, honey, invert sugar, lactose, malt syrup, maltose, molasses, raw sugar, sucrose, trehalose, or turbinado sugar.  Do not eat more than the following amounts of added sugar per day:  6 teaspoons (25 g) for women.  9 teaspoons (38 g) for men. ? Foods that contain processed or refined starches and grains. ? Refined grain products, such as white flour,  degermed cornmeal, white bread, and white rice. Shopping  Choose nutrient-rich snacks, such as vegetables, whole fruits, and nuts. Avoid high-calorie and high-sugar snacks, such as potato chips, fruit snacks, and candy.  Use oil-based dressings and spreads on foods instead of solid fats such as butter, stick margarine, or cream cheese.  Limit pre-made sauces, mixes, and "instant" products such as flavored rice, instant noodles, and ready-made pasta.  Try more plant-protein sources, such as tofu, tempeh, black beans, edamame, lentils, nuts, and seeds.  Explore eating plans such as the Mediterranean diet or vegetarian diet. Cooking  Use oil to saut or stir-fry foods instead of solid fats such as butter, stick margarine, or lard.  Try baking, boiling, grilling, or broiling instead of frying.  Remove the fatty part of meats before cooking.  Steam vegetables in water or broth. Meal planning   At meals, imagine dividing your plate into fourths: ? One-half of your plate is fruits and vegetables. ? One-fourth of your plate is whole grains. ? One-fourth of your plate is protein, especially lean meats, poultry, eggs, tofu, beans, or nuts.  Include low-fat dairy as part of your daily diet. Lifestyle  Choose healthy options in all settings, including home, work, school, restaurants, or stores.  Prepare your food safely: ? Wash your hands after handling raw meats. ? Keep food preparation surfaces clean by regularly washing with hot, soapy water. ? Keep raw meats separate from ready-to-eat foods, such as fruits and vegetables. ? Cook seafood, meat, poultry, and eggs to the recommended internal temperature. ?  Store foods at safe temperatures. In general:  Keep cold foods at 55F (4.4C) or below.  Keep hot foods at 155F (60C) or above.  Keep your freezer at [facility] (-17.8C) or below.  Foods are no longer safe to eat when they have been between the temperatures of 40-155F (4.4-60C)  for more than 2 hours. What foods should I eat? Fruits Aim to eat 2 cup-equivalents of fresh, canned (in natural juice), or frozen fruits each day. Examples of 1 cup-equivalent of fruit include 1 small apple, 8 large strawberries, 1 cup canned fruit,  cup dried fruit, or 1 cup 100% juice. Vegetables Aim to eat 2-3 cup-equivalents of fresh and frozen vegetables each day, including different varieties and colors. Examples of 1 cup-equivalent of vegetables include 2 medium carrots, 2 cups raw, leafy greens, 1 cup chopped vegetable (raw or cooked), or 1 medium baked potato. Grains Aim to eat 6 ounce-equivalents of whole grains each day. Examples of 1 ounce-equivalent of grains include 1 slice of bread, 1 cup ready-to-eat cereal, 3 cups popcorn, or  cup cooked rice, pasta, or cereal. Meats and other proteins Aim to eat 5-6 ounce-equivalents of protein each day. Examples of 1 ounce-equivalent of protein include 1 egg, 1/2 cup nuts or seeds, or 1 tablespoon (16 g) peanut butter. A cut of meat or fish that is the size of a deck of cards is about 3-4 ounce-equivalents.  Of the protein you eat each week, try to have at least 8 ounces come from seafood. This includes salmon, trout, herring, and anchovies. Dairy Aim to eat 3 cup-equivalents of fat-free or low-fat dairy each day. Examples of 1 cup-equivalent of dairy include 1 cup (240 mL) milk, 8 ounces (250 g) yogurt, 1 ounces (44 g) natural cheese, or 1 cup (240 mL) fortified soy milk. Fats and oils  Aim for about 5 teaspoons (21 g) per day. Choose monounsaturated fats, such as canola and olive oils, avocados, peanut butter, and most nuts, or polyunsaturated fats, such as sunflower, corn, and soybean oils, walnuts, pine nuts, sesame seeds, sunflower seeds, and flaxseed. Beverages  Aim for six 8-oz glasses of water per day. Limit coffee to three to five 8-oz cups per day.  Limit caffeinated beverages that have added calories, such as soda and energy  drinks.  Limit alcohol intake to no more than 1 drink a day for nonpregnant women and 2 drinks a day for men. One drink equals 12 oz of beer (355 mL), 5 oz of wine (148 mL), or 1 oz of hard liquor (44 mL). Seasoning and other foods  Avoid adding excess amounts of salt to your foods. Try flavoring foods with herbs and spices instead of salt.  Avoid adding sugar to foods.  Try using oil-based dressings, sauces, and spreads instead of solid fats. This information is based on general [place] nutrition guidelines. For more information, visit [employer]es. Exact amounts may vary based on your nutrition needs. Summary  A healthy eating plan may help you to maintain a healthy weight, reduce the risk of chronic diseases, and stay active throughout your life.  Plan your meals. Make sure you eat the right portions of a variety of nutrient-rich foods.  Try baking, boiling, grilling, or broiling instead of frying.  Choose healthy options in all settings, including home, work, school, restaurants, or stores. This information is not intended to replace advice given to you by your health care provider. Make sure you discuss any questions you have with your health care provider. Document  Revised: 11/30/2017 Document Reviewed: 11/30/2017 Elsevier Patient Education  [place].

## 2020-01-13 NOTE — Progress Notes (Signed)
SUBJECTIVE:   CHIEF COMPLAINT / HPI: Diabetes f/u  Patient has been overall doing well, no problems taking metformin. She has been increasing her walking and no longer has left arm pain, however, she wants to be able to exercise more and feels she is held back by her weight. She is very interested in losing weight to improve her health. She also wants to cook more at home, but has not been doing so and feels that this also contributes to her weight and diabetes. Overall today she feels well, no specific complaints, but she is anxious about her A1c result.  She also has questions about the COVID-19 vaccine. She had COVID-19 this past year and was only mildly ill, did not require hospitalization. She wonders what the benefit of the vaccine is and if she has any residual immunity from having the illness.  PERTINENT  PMH / PSH: DMT2, morbid obesity,   OBJECTIVE:   BP 132/88   Pulse 98   Ht 5\' 4"  (1.626 m)   Wt 266 lb 9.6 oz (120.9 kg)   LMP 01/02/2020   SpO2 98%   BMI 45.76 kg/m   Vitals:   01/13/20 1348 01/13/20 1400  BP: 132/88   Pulse: (!) 103 98  SpO2: 98%    Physical Exam Constitutional:      General: She is not in acute distress.    Appearance: Normal appearance. She is obese. She is not ill-appearing, toxic-appearing or diaphoretic.  HENT:     Head: Normocephalic and atraumatic.     Mouth/Throat:     Mouth: Mucous membranes are moist.  Eyes:     Conjunctiva/sclera: Conjunctivae normal.  Cardiovascular:     Rate and Rhythm: Normal rate and regular rhythm.     Pulses: Normal pulses.     Heart sounds: Normal heart sounds. No murmur. No friction rub. No gallop.   Pulmonary:     Effort: Pulmonary effort is normal.     Breath sounds: Normal breath sounds. No wheezing, rhonchi or rales.  Abdominal:     General: Abdomen is flat. Bowel sounds are normal. There is no distension.     Palpations: Abdomen is soft.     Tenderness: There is no abdominal tenderness.   Musculoskeletal:     Right lower leg: No edema.     Left lower leg: No edema.  Skin:    General: Skin is warm and dry.  Neurological:     General: No focal deficit present.     Mental Status: She is oriented to person, place, and time. Mental status is at baseline.  Psychiatric:        Mood and Affect: Mood normal.        Behavior: Behavior normal.    ASSESSMENT/PLAN:  Dorothy Haynes si a 33 yo woman with DMT2, morbid obesity, HTN  Type 2 diabetes mellitus without complication, without long-term current use of insulin (HCC) A1c today 6.5, same as 3 months ago. Patient currently taking Metformin 1000mg  BID, will increase to metformin XR 1000mg  BID. Patient does have occasional diarrhea, but this is chronic and not a change since starting the medication. Discussed possibility of starting GLP-1 agonist (such as semaglutide) to help with DM and weight loss. At this time patient does want to lose weight, but does not want to start a new medication. -Switch to metformin 1000mg  XR BID -Patient on simvastatin, LDL 86 and ASCVD 10%, wishes to limit medication changes at this time, but can consider switching  to rosuvastatin later -Follow up in 3 months for A1c recheck  Morbid obesity (HCC) Patient with BMI 45. Interested in weight loss, improved nutrition, and increasing exercise. Has tried to do these things on her own, interested in more support. Requesting referral to medical weight management. - referral to healthy weight and wellness clinic  Essential hypertension BP appropriate today at 130s/80s. Patient on HCTZ currently, wishes to limit medication changes. Will consider adding or switching to ACE/ARB at next visit due to DMT2 diagnosis.   Dorothy Mylar, MD [facility]

## 2020-01-17 NOTE — Assessment & Plan Note (Signed)
Patient with BMI 45. Interested in weight loss, improved nutrition, and increasing exercise. Has tried to do these things on her own, interested in more support. Requesting referral to medical weight management. - referral to healthy weight and wellness clinic

## 2020-01-17 NOTE — Assessment & Plan Note (Signed)
A1c today 6.5, same as 3 months ago. Patient currently taking Metformin 1000mg  BID, will increase to metformin XR 1000mg  BID. Patient does have occasional diarrhea, but this is chronic and not a change since starting the medication. Discussed possibility of starting GLP-1 agonist (such as semaglutide) to help with DM and weight loss. At this time patient does want to lose weight, but does not want to start a new medication. -Switch to metformin 1000mg  XR BID -Patient on simvastatin, LDL 86 and ASCVD 10%, wishes to limit medication changes at this time, but can consider switching to rosuvastatin later -Follow up in 3 months for A1c recheck

## 2020-01-17 NOTE — Assessment & Plan Note (Signed)
BP appropriate today at 130s/80s. Patient on HCTZ currently, wishes to limit medication changes. Will consider adding or switching to ACE/ARB at next visit due to DMT2 diagnosis.

## 2020-01-18 ENCOUNTER — Ambulatory Visit: Payer: Medicaid Other | Admitting: Adult Health

## 2020-01-18 ENCOUNTER — Encounter: Payer: Self-pay | Admitting: *Deleted

## 2020-01-18 ENCOUNTER — Other Ambulatory Visit: Payer: Self-pay

## 2020-01-18 ENCOUNTER — Encounter: Payer: Self-pay | Admitting: Adult Health

## 2020-01-18 DIAGNOSIS — G4733 Obstructive sleep apnea (adult) (pediatric): Secondary | ICD-10-CM

## 2020-01-18 NOTE — Assessment & Plan Note (Addendum)
Good CPAP compliance.  Will change mask to see if we can decrease her number of events and improve tolerability. She wants to try the DreamWear nasal mask.  Sample was given.  She is advised to call back if she continues to have issues.    Plan  Patient Instructions  Wear CPAP each night , goal is to wear for at least 4hrs each night  Trial of Dream Wear Nasal Mask sample .  Call back if still having trouble.  Work on healthy weight.  Do not drive if sleepy  Follow up in 4 months and As needed

## 2020-01-18 NOTE — Patient Instructions (Addendum)
Wear CPAP each night , goal is to wear for at least 4hrs each night  Trial of Dream Wear Nasal Mask sample .  Call back if still having trouble.  Work on healthy weight.  Do not drive if sleepy  Follow up in 4 months and As needed

## 2020-01-18 NOTE — Progress Notes (Signed)
_0  ID: Dorothy Haynes, female    DOB: Dec 31, 1985, 34 y.o.   MRN: [id number]  Chief Complaint  Patient presents with  . 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  TEST/EVENTS :  N PSG 10/2018-weight _1 pounds-AHI 10/hour, lowest desaturation 85%, supine AHI 13/hour, REM AHI 51/hour  01/18/2020 Follow up : OSA  Patient presents for a follow-up visit.  Patient was recently started on CPAP for mild sleep apnea.  Patient says she is trying to get used to her CPAP.  She is having trouble with her current mask-dream wear full face. .  Feels that it is uncomfortable and leaks all the time.  Download shows excellent compliance with 97% usage.  Daily average usage at 4 hours.  Patient is on auto CPAP 5 to 15 cm H2O.  AHI is 7.6.  Significant mask leaks.    No Known Allergies  Immunization History  Administered Date(s) Administered  . Influenza,inj,Quad PF,6+ Mos 07/21/2018  . Tdap 05/28/2016    Past Medical History:  Diagnosis Date  . Diverticulitis 03/15/2018  . GERD (gastroesophageal reflux disease) 2009  . Gestational diabetes 2008  . Headache   . History of gallstones 10/2015   s/p cholecystectomy 10/09/15  . Hypertension   . Morbid obesity ([place]) 10/09/2015  . Shortness of breath dyspnea   . Tobacco abuse 10/09/2015    Tobacco History: Social History   Tobacco Use  Smoking Status Former Smoker  . Packs/day: 0.20  . Years: 17.00  . Pack years: 3.40  . Types: Cigarettes  . Quit date: 03/01/2018  . Years since quitting: 1.8  Smokeless Tobacco Never Used   Counseling given: Not Answered   Outpatient Medications Prior to Visit  Medication Sig Dispense Refill  . calcium-vitamin D (OSCAL WITH D) 500-200 MG-UNIT tablet Take 1 tablet by mouth 2 (two) times daily. 60 tablet 11  . cetirizine (ZYRTEC ALLERGY)  10 MG tablet Take 1 tablet (10 mg total) by mouth daily. 30 tablet 0  . hydrochlorothiazide (HYDRODIURIL) 12.5 MG tablet Take 1 tablet (12.5 mg total) by mouth daily. 90 tablet 3  . metFORMIN (GLUCOPHAGE-XR) 500 MG 24 hr tablet Take 2 tablets (1,000 mg total) by mouth in the morning and at bedtime. 180 tablet 3  . simvastatin (ZOCOR) 20 MG tablet TAKE 1 TABLET BY MOUTH AT BEDTIME 30 tablet 3  . ACCU-CHEK SOFTCLIX LANCETS lancets Use as instructed (Patient not taking: Reported on 01/06/2020) 100 each 12  . blood glucose meter kit and supplies KIT Dispense based on patient and insurance preference. Use up to four times daily as directed. (FOR ICD-9 250.00, 250.01). (Patient not taking: Reported on 01/06/2020) 1 each 0  . glucose blood (ACCU-CHEK AVIVA) test strip 1 each by Other route as directed. Use as instructed (Patient not taking: Reported on 01/06/2020) 100 each 12   Facility-Administered Medications Prior to Visit  Medication Dose Route Frequency Provider Last Rate Last Admin  . medroxyPROGESTERone (DEPO-PROVERA) injection 150 mg  150 mg Intramuscular Q90 days Luvenia Redden, PA-C   150 mg at 10/14/19 0945     Review of Systems:   Constitutional:   No  weight loss, night sweats,  Fevers, chills, fatigue, or  lassitude.  HEENT:   No headaches,  Difficulty swallowing,  Tooth/dental problems, or  Sore throat,  No sneezing, itching, ear ache, nasal congestion, post nasal drip,   CV:  No chest pain,  Orthopnea, PND, swelling in lower extremities, anasarca, dizziness, palpitations, syncope.   GI  No heartburn, indigestion, abdominal pain, nausea, vomiting, diarrhea, change in bowel habits, loss of appetite, bloody stools.   Resp: No shortness of breath with exertion or at rest.  No excess mucus, no productive cough,  No non-productive cough,  No coughing up of blood.  No change in color of mucus.  No wheezing.  No chest wall deformity  Skin: no rash or lesions.  GU: no dysuria,  change in color of urine, no urgency or frequency.  No flank pain, no hematuria   MS:  No joint pain or swelling.  No decreased range of motion.  No back pain.    Physical Exam  BP 124/76 (BP Location: Left Arm, Cuff Size: Large)   Pulse 83   Ht 5' 3" (1.6 m)   Wt 268 lb (121.6 kg)   LMP 01/02/2020   SpO2 95%   BMI 47.47 kg/m   GEN: A/Ox3; pleasant , NAD, BMI 47   HEENT:  [place]/AT,   NOSE-clear, THROAT-clear, no lesions, no postnasal drip or exudate noted.   NECK:  Supple w/ fair ROM; no JVD; normal carotid impulses w/o bruits; no thyromegaly or nodules palpated; no lymphadenopathy.    RESP  Clear  P & A; w/o, wheezes/ rales/ or rhonchi. no accessory muscle use, no dullness to percussion  CARD:  RRR, no m/r/g, no peripheral edema, pulses intact, no cyanosis or clubbing.  GI:   Soft & nt; nml bowel sounds; no organomegaly or masses detected.   Musco: Warm bil, no deformities or joint swelling noted.   Neuro: alert, no focal deficits noted.    Skin: Warm, no lesions or rashes    Lab Results:   BNP No results found for: BNP  ProBNP No results found for: PROBNP  Imaging: No results found.  medroxyPROGESTERone (DEPO-PROVERA) injection 150 mg    Date Action Dose Route User   01/06/2020 0930 Given 150 mg Intramuscular (Left Deltoid) Day, Diane L, RN      No flowsheet data found.  No results found for: NITRICOXIDE      Assessment & Plan:   OSA (obstructive sleep apnea) Good CPAP compliance.  Will change mask to see if we can decrease her number of events and improve tolerability. She wants to try the DreamWear nasal mask.  Sample was given.  She is advised to call back if she continues to have issues.    Plan  Patient Instructions  Wear CPAP each night , goal is to wear for at least 4hrs each night  Trial of Dream Wear Nasal Mask sample .  Call back if still having trouble.  Work on healthy weight.  Do not drive if sleepy  Follow up in 4 months and As  needed       Morbid obesity ([place]) Weight loss      Rexene Edison, NP 01/18/2020

## 2020-01-18 NOTE — Assessment & Plan Note (Signed)
-   Weight loss 

## 2020-01-31 DIAGNOSIS — G4733 Obstructive sleep apnea (adult) (pediatric): Secondary | ICD-10-CM | POA: Diagnosis not present

## 2020-02-06 ENCOUNTER — Other Ambulatory Visit: Payer: Self-pay | Admitting: Family Medicine

## 2020-02-06 DIAGNOSIS — E119 Type 2 diabetes mellitus without complications: Secondary | ICD-10-CM

## 2020-03-01 DIAGNOSIS — G4733 Obstructive sleep apnea (adult) (pediatric): Secondary | ICD-10-CM | POA: Diagnosis not present

## 2020-03-02 ENCOUNTER — Ambulatory Visit (INDEPENDENT_AMBULATORY_CARE_PROVIDER_SITE_OTHER): Payer: Medicaid Other | Admitting: Medical

## 2020-03-02 ENCOUNTER — Encounter: Payer: Self-pay | Admitting: Medical

## 2020-03-02 ENCOUNTER — Other Ambulatory Visit: Payer: Self-pay

## 2020-03-02 ENCOUNTER — Other Ambulatory Visit (HOSPITAL_COMMUNITY)
Admission: RE | Admit: 2020-03-02 | Discharge: 2020-03-02 | Disposition: A | Discharge status: Home / Self Care | Payer: Medicaid Other | Source: Ambulatory Visit | Attending: Medical | Admitting: Medical

## 2020-03-02 VITALS — BP 130/92 | HR 101 | Ht 64.0 in | Wt 267.2 lb

## 2020-03-02 DIAGNOSIS — Z3042 Encounter for surveillance of injectable contraceptive: Secondary | ICD-10-CM | POA: Diagnosis not present

## 2020-03-02 DIAGNOSIS — B372 Candidiasis of skin and nail: Secondary | ICD-10-CM | POA: Diagnosis not present

## 2020-03-02 DIAGNOSIS — Z3202 Encounter for pregnancy test, result negative: Secondary | ICD-10-CM

## 2020-03-02 DIAGNOSIS — Z01419 Encounter for gynecological examination (general) (routine) without abnormal findings: Secondary | ICD-10-CM | POA: Diagnosis not present

## 2020-03-02 LAB — POCT PREGNANCY, URINE: Preg Test, Ur: NEGATIVE

## 2020-03-02 MED ORDER — NYSTATIN 100000 UNIT/GM EX CREA: TOPICAL_CREAM | CUTANEOUS | 0 refills | Status: DC

## 2020-03-02 NOTE — Patient Instructions (Signed)
Medroxyprogesterone injection [Contraceptive] What is this medicine? MEDROXYPROGESTERONE (me DROX ee proe JES te rone) contraceptive injections prevent pregnancy. They provide effective birth control for 3 months. Depo-subQ Provera 104 is also used for treating pain related to endometriosis. This medicine may be used for other purposes; ask your health care provider or pharmacist if you have questions. COMMON BRAND NAME(S): Depo-Provera, Depo-subQ Provera 104 What should I tell my health care provider before I take this medicine? They need to know if you have any of these conditions:  frequently drink alcohol  asthma  blood vessel disease or a history of a blood clot in the lungs or legs  bone disease such as osteoporosis  breast cancer  diabetes  eating disorder (anorexia nervosa or bulimia)  high blood pressure  HIV infection or AIDS  kidney disease  liver disease  mental depression  migraine  seizures (convulsions)  stroke  tobacco smoker  vaginal bleeding  an unusual or allergic reaction to medroxyprogesterone, other hormones, medicines, foods, dyes, or preservatives  pregnant or trying to get pregnant  breast-feeding How should I use this medicine? Depo-Provera Contraceptive injection is given into a muscle. Depo-subQ Provera 104 injection is given under the skin. These injections are given by a health care professional. You must not be pregnant before getting an injection. The injection is usually given during the first 5 days after the start of a menstrual period or 6 weeks after delivery of a baby. Talk to your pediatrician regarding the use of this medicine in children. Special care may be needed. These injections have been used in female children who have started having menstrual periods. Overdosage: If you think you have taken too much of this medicine contact a poison control center or emergency room at once. NOTE: This medicine is only for you. Do not  share this medicine with others. What if I miss a dose? Try not to miss a dose. You must get an injection once every 3 months to maintain birth control. If you cannot keep an appointment, call and reschedule it. If you wait longer than 13 weeks between Depo-Provera contraceptive injections or longer than 14 weeks between Depo-subQ Provera 104 injections, you could get pregnant. Use another method for birth control if you miss your appointment. You may also need a pregnancy test before receiving another injection. What may interact with this medicine? Do not take this medicine with any of the following medications:  bosentan This medicine may also interact with the following medications:  aminoglutethimide  antibiotics or medicines for infections, especially rifampin, rifabutin, rifapentine, and griseofulvin  aprepitant  barbiturate medicines such as phenobarbital or primidone  bexarotene  carbamazepine  medicines for seizures like ethotoin, felbamate, oxcarbazepine, phenytoin, topiramate  modafinil  St. John's wort This list may not describe all possible interactions. Give your health care provider a list of all the medicines, herbs, non-prescription drugs, or dietary supplements you use. Also tell them if you smoke, drink alcohol, or use illegal drugs. Some items may interact with your medicine. What should I watch for while using this medicine? This drug does not protect you against HIV infection (AIDS) or other sexually transmitted diseases. Use of this product may cause you to lose calcium from your bones. Loss of calcium may cause weak bones (osteoporosis). Only use this product for more than 2 years if other forms of birth control are not right for you. The longer you use this product for birth control the more likely you will be at risk   for weak bones. Ask your health care professional how you can keep strong bones. You may have a change in bleeding pattern or irregular periods.  Many females stop having periods while taking this drug. If you have received your injections on time, your chance of being pregnant is very low. If you think you may be pregnant, see your health care professional as soon as possible. Tell your health care professional if you want to get pregnant within the next year. The effect of this medicine may last a long time after you get your last injection. What side effects may I notice from receiving this medicine? Side effects that you should report to your doctor or health care professional as soon as possible:  allergic reactions like skin rash, itching or hives, swelling of the face, lips, or tongue  breast tenderness or discharge  breathing problems  changes in vision  depression  feeling faint or lightheaded, falls  fever  pain in the abdomen, chest, groin, or leg  problems with balance, talking, walking  unusually weak or tired  yellowing of the eyes or skin Side effects that usually do not require medical attention (report to your doctor or health care professional if they continue or are bothersome):  acne  fluid retention and swelling  headache  irregular periods, spotting, or absent periods  temporary pain, itching, or skin reaction at site where injected  weight gain This list may not describe all possible side effects. Call your doctor for medical advice about side effects. You may report side effects to FDA at 1-800-FDA-1088. Where should I keep my medicine? This does not apply. The injection will be given to you by a health care professional. NOTE: This sheet is a summary. It may not cover all possible information. If you have questions about this medicine, talk to your doctor, pharmacist, or health care provider.  2020 Elsevier/Gold Standard (2008-09-08 18:37:56) Pap Test Why am I having this test? A Pap test, also called a Pap smear, is a screening test to check for signs of:  Cancer of the vagina,  cervix, and uterus. The cervix is the lower part of the uterus that opens into the vagina.  Infection.  Changes that may be a sign that cancer is developing (precancerous changes). Women need this test on a regular basis. In general, you should have a Pap test every 3 years until you reach menopause or age 90. Women aged 30-60 may choose to have their Pap test done at the same time as an HPV (human papillomavirus) test every 5 years (instead of every 3 years). Your health care provider may recommend having Pap tests more or less often depending on your medical conditions and past Pap test results. What kind of sample is taken?  Your health care provider will collect a sample of cells from the surface of your cervix. This will be done using a small cotton swab, plastic spatula, or brush. This sample is often collected during a pelvic exam, when you are lying on your back on an exam table with feet in footrests (stirrups). In some cases, fluids (secretions) from the cervix or vagina may also be collected. How do I prepare for this test?  Be aware of where you are in your menstrual cycle. If you are menstruating on the day of the test, you may be asked to reschedule.  You may need to reschedule if you have a known vaginal infection on the day of the test.  Follow instructions from  your health care provider about: ? Changing or stopping your regular medicines. Some medicines can cause abnormal test results, such as digitalis and tetracycline. ? Avoiding douching or taking a bath the day before or the day of the test. Tell a health care provider about:  Any allergies you have.  All medicines you are taking, including vitamins, herbs, eye drops, creams, and over-the-counter medicines.  Any blood disorders you have.  Any surgeries you have had.  Any medical conditions you have.  Whether you are pregnant or may be pregnant. How are the results reported? Your test results will be reported as  either abnormal or normal. A false-positive result can occur. A false positive is incorrect because it means that a condition is present when it is not. A false-negative result can occur. A false negative is incorrect because it means that a condition is not present when it is. What do the results mean? A normal test result means that you do not have signs of cancer of the vagina, cervix, or uterus. An abnormal result may mean that you have:  Cancer. A Pap test by itself is not enough to diagnose cancer. You will have more tests done in this case.  Precancerous changes in your vagina, cervix, or uterus.  Inflammation of the cervix.  An STD (sexually transmitted disease).  A fungal infection.  A parasite infection. Talk with your health care provider about what your results mean. Questions to ask your health care provider Ask your health care provider, or the department that is doing the test:  When will my results be ready?  How will I get my results?  What are my treatment options?  What other tests do I need?  What are my next steps? Summary  In general, women should have a Pap test every 3 years until they reach menopause or age 81.  Your health care provider will collect a sample of cells from the surface of your cervix. This will be done using a small cotton swab, plastic spatula, or brush.  In some cases, fluids (secretions) from the cervix or vagina may also be collected. This information is not intended to replace advice given to you by your health care provider. Make sure you discuss any questions you have with your health care provider. Document Revised: 04/27/2017 Document Reviewed: 04/27/2017 Elsevier Patient Education  2020 [employer].

## 2020-03-02 NOTE — Progress Notes (Signed)
History:  [name] is a 34 y.o. G1P0 who presents to clinic today for annual exam with pap smear. She is currently on Depo Provera and due for next injection after 03/23/20. Last pap smear was 03/20/2017 and normal, but with absent transformation zone. She states bleeding x 5 weeks prior to last Depo injection. Brown discharge at current. She does desire STD testing today. She is also concerned about a rash on her inner thigh, right. She has been using Cortizone    The following portions of the patient's history were reviewed and updated as appropriate: allergies, current medications, family history, past medical history, social history, past surgical history and problem list.  Review of Systems:  Review of Systems  Constitutional: Negative for fever.  Gastrointestinal: Negative for abdominal pain.  Genitourinary: Negative for dysuria, frequency and urgency.       Neg - vaginal bleeding  + discharge  Skin: Positive for rash. Negative for itching.      Objective:  Physical Exam BP (!) 130/92 (BP Location: Left Arm)   Pulse (!) 101   Ht 5\' 4"  (1.626 m)   Wt 267 lb 3.2 oz (121.2 kg)   BMI 45.86 kg/m  Physical Exam Vitals and nursing note reviewed. Exam conducted with a chaperone present.  Constitutional:      General: She is not in acute distress.    Appearance: She is well-developed. She is obese.  HENT:     Head: Normocephalic and atraumatic.  Eyes:     Extraocular Movements: Extraocular movements intact.     Conjunctiva/sclera: Conjunctivae normal.  Cardiovascular:     Rate and Rhythm: Normal rate and regular rhythm.     Heart sounds: No murmur heard.   Pulmonary:     Effort: Pulmonary effort is normal. No respiratory distress.     Breath sounds: Normal breath sounds. No wheezing.  Abdominal:     General: Bowel sounds are normal. There is no distension.     Palpations: Abdomen is soft. There is no mass.     Tenderness: There is no abdominal tenderness. There  is no guarding or rebound.  Genitourinary:    General: Normal vulva.     Vagina: Vaginal discharge (small, mucous) present. No bleeding.     Cervix: No cervical motion tenderness, discharge or friability.     Uterus: Not enlarged and not tender.      Adnexa:        Right: No mass or tenderness.         Left: No mass or tenderness.    Musculoskeletal:     Cervical back: Neck supple.  Skin:    General: Skin is warm and dry.     Findings: No erythema.  Neurological:     Mental Status: She is alert and oriented to person, place, and time.     Assessment & Plan:  1. Encounter for annual routine gynecological examination - Cytology - PAP( [facility]) - HIV Antibody (routine testing w rflx) - RPR - Hepatitis B Surface AntiGEN - Hepatitis C Antibody  2. Yeast dermatitis - nystatin cream (MYCOSTATIN); Apply to affected area 2 times daily  Dispense: 15 g; Refill: 0 - Advised to discontinue Cortizone cream at this time  RTC for next Depo on or after 03/23/20 Patient will be contacted with abnormal results, advised that results will all be available in MyChart  Approximately 15 minutes of total time was spent with this patient   03/25/20, PA-C  03/02/2020 11:38 AM

## 2020-03-03 LAB — RPR: RPR Ser Ql: NONREACTIVE

## 2020-03-03 LAB — HIV ANTIBODY (ROUTINE TESTING W REFLEX): HIV Screen 4th Generation wRfx: NONREACTIVE

## 2020-03-03 LAB — HEPATITIS C ANTIBODY: Hep C Virus Ab: <0.1 s/co ratio (ref 0.0–0.9)

## 2020-03-03 LAB — HEPATITIS B SURFACE ANTIGEN: Hepatitis B Surface Ag: NEGATIVE

## 2020-03-08 ENCOUNTER — Other Ambulatory Visit: Payer: Self-pay | Admitting: Family Medicine

## 2020-03-08 DIAGNOSIS — I10 Essential (primary) hypertension: Secondary | ICD-10-CM

## 2020-03-08 LAB — CYTOLOGY - PAP
Chlamydia: NEGATIVE
Comment: NEGATIVE
Comment: NEGATIVE
Comment: NEGATIVE
Comment: NORMAL
Diagnosis: NEGATIVE
High risk HPV: NEGATIVE
Neisseria Gonorrhea: NEGATIVE
Trichomonas: NEGATIVE

## 2020-03-26 ENCOUNTER — Other Ambulatory Visit: Payer: Self-pay

## 2020-03-26 ENCOUNTER — Ambulatory Visit (INDEPENDENT_AMBULATORY_CARE_PROVIDER_SITE_OTHER): Payer: Medicaid Other | Admitting: General Practice

## 2020-03-26 VITALS — BP 122/96 | HR 88 | Ht 64.0 in | Wt 267.0 lb

## 2020-03-26 DIAGNOSIS — Z3042 Encounter for surveillance of injectable contraceptive: Secondary | ICD-10-CM

## 2020-03-26 NOTE — Progress Notes (Signed)
Patient seen and assessed by nursing staff.  Agree with documentation and plan.  

## 2020-03-26 NOTE — Progress Notes (Signed)
Dorothy Haynes here for Depo-Provera  Injection.  Injection administered without complication. Patient will return in 3 months for next injection.  Marylynn Pearson, RN 03/26/2020  9:10 AM

## 2020-04-01 DIAGNOSIS — G4733 Obstructive sleep apnea (adult) (pediatric): Secondary | ICD-10-CM | POA: Diagnosis not present

## 2020-04-04 ENCOUNTER — Encounter: Payer: Self-pay | Admitting: Family Medicine

## 2020-04-04 ENCOUNTER — Ambulatory Visit (INDEPENDENT_AMBULATORY_CARE_PROVIDER_SITE_OTHER): Payer: Medicaid Other | Admitting: Family Medicine

## 2020-04-04 ENCOUNTER — Other Ambulatory Visit: Payer: Self-pay

## 2020-04-04 VITALS — BP 124/90 | HR 93 | Ht 64.0 in | Wt 266.5 lb

## 2020-04-04 DIAGNOSIS — E119 Type 2 diabetes mellitus without complications: Secondary | ICD-10-CM

## 2020-04-04 DIAGNOSIS — I10 Essential (primary) hypertension: Secondary | ICD-10-CM | POA: Diagnosis not present

## 2020-04-04 LAB — POCT GLYCOSYLATED HEMOGLOBIN (HGB A1C): HbA1c, POC (controlled diabetic range): 6.9 % (ref 0.0–7.0)

## 2020-04-04 MED ORDER — LIRAGLUTIDE 18 MG/3ML ~~LOC~~ SOPN: PEN_INJECTOR | SUBCUTANEOUS | 3 refills | Status: DC

## 2020-04-04 MED ORDER — LOSARTAN POTASSIUM 25 MG PO TABS: 25.0000 mg | ORAL_TABLET | Freq: Every day | ORAL | 3 refills | Status: DC

## 2020-04-04 MED ORDER — NOVOFINE 32G X 6 MM MISC: 3 refills | Status: DC

## 2020-04-04 NOTE — Progress Notes (Signed)
    SUBJECTIVE:   CHIEF COMPLAINT / HPI: DM f/u  DM - Will obtain A1c today: crossed from pre-diabetes to DM in February, last A1c 6.5%  - Patient frustrated bc she has not lost weight, trouble adhering to diet, interested in seeing nutritionist - Not checking sugars daily - compliant with metformin  HTN - at goal today 124/90 - with new dx of DM and increasing A1c, will switch from HCTZ to losartan for renal protection - no dizziness, lightheadedness, change in vision  PERTINENT  PMH / PSH: DM, HTN  OBJECTIVE:   BP 124/90   Pulse 93   Ht 5\' 4"  (1.626 m)   Wt 266 lb 8 oz (120.9 kg)   SpO2 99%   BMI 45.74 kg/m   Physical Exam Vitals and nursing note reviewed.  Constitutional:      General: She is not in acute distress.    Appearance: Normal appearance. She is obese. She is not ill-appearing, toxic-appearing or diaphoretic.  HENT:     Head: Normocephalic and atraumatic.  Cardiovascular:     Rate and Rhythm: Normal rate and regular rhythm.     Pulses: Normal pulses.     Heart sounds: Normal heart sounds. No murmur heard.  No friction rub. No gallop.   Pulmonary:     Effort: Pulmonary effort is normal.     Breath sounds: Normal breath sounds.  Musculoskeletal:     Right lower leg: No edema.     Left lower leg: No edema.  Skin:    General: Skin is warm and dry.  Neurological:     General: No focal deficit present.     Mental Status: She is alert. Mental status is at baseline.  Psychiatric:        Mood and Affect: Mood normal.        Behavior: Behavior normal.    ASSESSMENT/PLAN:   Type 2 diabetes mellitus without complication, without long-term current use of insulin (HCC) A1c increased from 6.5% to 6.9% despite doubling of metformin to 1000mg  BID at last visit. Patient interested in weight loss, wishes to try another medication. Will prescribe victoza as indicated for DM and weight loss. Current Regimen: metformin 1000mg  XR, start victoza CBGs: recommend  checking daily and keeping log Last A1c: 6.9% today  Denies polyuria, polydipsia, hypoglycemia Last Eye Exam: recommend she perform this year Statin: patient <40yo, last lipid panel in 06/2019, will recheck at next visit, wishes to limit number of medications started per visit ACE/ARB: started on losartan today -F/U 3 months for A1c check     Essential hypertension At goal today - Medications: currently well-controlled on HCTZ - Compliance: good - Checking BP at home: occasionally - Denies any SOB, CP, vision changes, LE edema, medication SEs, or symptoms of hypotension - Diet: standard american diet - Exercise: continuing walking regimen - obtain BMP today - discontinue HCTZ in order to start Losartan for DM dx, will titrate as needed      , MD [facility]

## 2020-04-04 NOTE — Patient Instructions (Addendum)
It was a pleasure to see you today!  1. Your hemoglobin A1c increased to 6.9. Continue taking metformin daily, but we will also add another medication that will help with your diabetes and help you lose weight. Liraglutide (Victoza) is an injectable medication under the skin. When you go to the pharmacy, ask the pharmacist to show you how to use the needle. Also watch this video: [email or web address]  The most common side effects are nausea, NAUSEA, and nausea, which will get better! Keep going. The other main side effect is a feeling of fullness earlier in a meal (which is the point).   2. For your blood pressure and to protect your kidneys with diabetes and high blood pressure, stop taking HCTZ and start taking Losartan once daily.  3. For weight loss I have also referred you to a nutritionist. Reducing calories down and picking whole fruits and vegetables with low caloric density is the way to go. They will help put you on the right path.   4. Let's follow up in 1 month.  Be Well  Dr. Leary Roca

## 2020-04-05 LAB — BASIC METABOLIC PANEL
BUN/Creatinine Ratio: 20 (ref 9–23)
BUN: 12 mg/dL (ref 6–20)
CO2: 22 mmol/L (ref 20–29)
Calcium: 9.2 mg/dL (ref 8.7–10.2)
Chloride: 103 mmol/L (ref 96–106)
Creatinine, Ser: 0.61 mg/dL (ref 0.57–1.00)
GFR calc Af Amer: 135 mL/min/{1.73_m2} (ref >59)
GFR calc non Af Amer: 117 mL/min/{1.73_m2} (ref >59)
Glucose: 145 mg/dL — ABNORMAL HIGH (ref 65–99)
Potassium: 4.2 mmol/L (ref 3.5–5.2)
Sodium: 141 mmol/L (ref 134–144)

## 2020-04-08 ENCOUNTER — Encounter: Payer: Self-pay | Admitting: Family Medicine

## 2020-04-08 NOTE — Assessment & Plan Note (Signed)
At goal today - Medications: currently well-controlled on HCTZ - Compliance: good - Checking BP at home: occasionally - Denies any SOB, CP, vision changes, LE edema, medication SEs, or symptoms of hypotension - Diet: standard american diet - Exercise: continuing walking regimen - obtain BMP today - discontinue HCTZ in order to start Losartan for DM dx, will titrate as needed

## 2020-04-08 NOTE — Assessment & Plan Note (Signed)
A1c increased from 6.5% to 6.9% despite doubling of metformin to 1000mg  BID at last visit. Patient interested in weight loss, wishes to try another medication. Will prescribe victoza as indicated for DM and weight loss. Current Regimen: metformin 1000mg  XR, start victoza CBGs: recommend checking daily and keeping log Last A1c: 6.9% today  Denies polyuria, polydipsia, hypoglycemia Last Eye Exam: recommend she perform this year Statin: patient <36yo, last lipid panel in 06/2019, will recheck at next visit, wishes to limit number of medications started per visit ACE/ARB: started on losartan today -F/U 3 months for A1c check

## 2020-04-25 ENCOUNTER — Other Ambulatory Visit: Payer: Self-pay

## 2020-04-25 ENCOUNTER — Encounter: Payer: Medicaid Other | Attending: Family Medicine | Admitting: Nutrition

## 2020-04-25 DIAGNOSIS — E119 Type 2 diabetes mellitus without complications: Secondary | ICD-10-CM | POA: Insufficient documentation

## 2020-04-25 DIAGNOSIS — G4733 Obstructive sleep apnea (adult) (pediatric): Secondary | ICD-10-CM | POA: Diagnosis not present

## 2020-04-29 NOTE — Progress Notes (Signed)
Patient was identified by name and DOB.  She says she is here to review her diet and loose weight. Says she has already started to do this, and feels like she has lost some weight, but not sure how much. Medication: Victoza, and Meformin.  Takes this in the AM, and Metformin in PM Typical day 7AM: up  Handful of chips.  Gets son off to school, 9-10:30:  Sweetened cereal, or smoothie for kale, handful of mixed fruits: pinapple, mang, sherbet, and water.  Or 2 eggs, 1 [place] sausage , biscuit with jelly or mustard on sandwich of eggs. Since starting to watch her diet, has switched from biscuits 3-5 days/wk., to english muffice 2-3 X/wk.   12-2PM can of regular soda-Sprite sips of this all day 5-6PM: 2 pieces of chicken tenders, with mixed greens, cranberry juice to drink, or chicken pot pie.   8-10PM snacks of prezels, handful only, and spinach dip--1/2cup.  Juice or soda-rarely diet soda Says this is new, and has started this reduced meals for 2 weeks.   Discussion: 1.  Gave much praise for changes to diet 2. Discussed the fact that 4 ounces of juice, or regular soda-including sprite, or gingerale, will raise blood sugar 80-100 points in 15 min.  Stressed need to test blood sugar 3.  She was given a One Touch Verio meter and shown how to use this.  Discussed goals: ac: less than 120. Suggested she test 1-2 X/day, alternating times from fasting, and acS to acL and HS. Discussed the importance of exercise in weight loss.  [facility].  Strongly suggested she try this again, and to walk for 20-30 minutes at first so she can be exercising at least 4-5 days/wk.  She agreed to do this. NOTE:  Most meals/snacks are high in fat, but feel like for now,  I want to stress only that she stop sweet drinks to get blood sugars down, and at return visit, discuss need to reduce fat in dip, oil on vegetables, sausage, biscuits, etc.

## 2020-05-02 DIAGNOSIS — G4733 Obstructive sleep apnea (adult) (pediatric): Secondary | ICD-10-CM | POA: Diagnosis not present

## 2020-05-02 NOTE — Patient Instructions (Addendum)
Test blood sugars 1-2 times per day before meals [facility].  Do this 2-3 times/wk, then walk on other days for 30-40 min.-trying to exercise 4=5 days/wk. Stop all sweet drinks and fruit juices.  Switch to diet soda, or flavored water.

## 2020-05-15 ENCOUNTER — Other Ambulatory Visit: Payer: Self-pay

## 2020-05-15 ENCOUNTER — Encounter: Payer: Self-pay | Admitting: Family Medicine

## 2020-05-15 ENCOUNTER — Ambulatory Visit (INDEPENDENT_AMBULATORY_CARE_PROVIDER_SITE_OTHER): Payer: Medicaid Other | Admitting: Family Medicine

## 2020-05-15 ENCOUNTER — Ambulatory Visit (HOSPITAL_COMMUNITY)
Admission: RE | Admit: 2020-05-15 | Discharge: 2020-05-15 | Disposition: A | Discharge status: Home / Self Care | Payer: Medicaid Other | Source: Ambulatory Visit | Attending: Family Medicine | Admitting: Family Medicine

## 2020-05-15 VITALS — BP 128/84 | HR 111 | Ht 64.0 in | Wt 262.8 lb

## 2020-05-15 DIAGNOSIS — K219 Gastro-esophageal reflux disease without esophagitis: Secondary | ICD-10-CM | POA: Diagnosis not present

## 2020-05-15 DIAGNOSIS — E119 Type 2 diabetes mellitus without complications: Secondary | ICD-10-CM | POA: Diagnosis not present

## 2020-05-15 DIAGNOSIS — R079 Chest pain, unspecified: Secondary | ICD-10-CM | POA: Insufficient documentation

## 2020-05-15 MED ORDER — FAMOTIDINE 10 MG PO TABS: 10.0000 mg | ORAL_TABLET | Freq: Every day | ORAL | 1 refills | Status: DC

## 2020-05-15 NOTE — Patient Instructions (Addendum)
It was a pleasure to see you today!  1. For your acid reflux: try taking famotidine (pepcid) once a day to see if it helps your reflux. You may increase to twice a day if once a day gives you some relief, but not enough.  2. Follow up in November to check your A1c  3. For weight loss: focus on incorporating more fruits and vegetables. Finding a routine with fruits in the morning and more vegetables to keep you full during the day will help decrease your caloric intake. I also recommend you look into Eat for Life by Dr. Monico Hoar.  Be Well!  Dr. Leary Roca

## 2020-05-15 NOTE — Assessment & Plan Note (Signed)
Patient seeing nutritionist for DM. In the action phase of change. She is writing down her food, planning her meals and counting carbs. Patient still eats foods that are mostly carbohydrates. Discussed importance of shifting focus of food balance to be primarily fruit and vegetables: nutrient-dense instead of calorically dense. We identified several areas of change: eating breakfast with fruits and protein to keep her from feeling hungry longer, more vegetables to fill up. Also incorporating more exercise with goal of walking for 30 minutes x 3 per week.

## 2020-05-15 NOTE — Assessment & Plan Note (Signed)
Patient doing well with Victoza. BG avg 130. Will recheck A1c in November.

## 2020-05-15 NOTE — Progress Notes (Signed)
SUBJECTIVE:   CHIEF COMPLAINT / HPI: diabetes f/u  Diabetes: Patient denies any lows. She has been checking her sugar.  Most of the time she reports being in the 130s, her highest BG was 190 postprandial.  She is doing well Victoza as she spent 1 more extra week at the lower dose of 0.6 due to nausea, however she has since increased to 1.2 mg and is doing well without nausea at that level now.  Weight Loss: Patient reports making progress in her weight loss goals with writing down her meals, counting her carbs, making an effort to eat more mindfully. She is down 2 lbs since last visit.  We did a 24-hour recall which is below.  Of note patient still has a dearth of fruit and vegetables in her diet.  She points out that she does not usually eat breakfast and she is very hungry in the morning, wants to find ways to help her stay full in the morning.  We discussed ways in which to increase low caloric density foods, high nutrient density foods such as fruit and vegetables to help her stay full throughout the day.  I also recommend eating protein in the morning to help her feel more full.  -Breakfast: black coffee with 2 tbs pumpkin spice creamer (sugar)  -Lunch: 12 PM grilled ham and cheese sandwich (45 carbs) with mayo, bbq chips (snacking from large bag), water, [employer] mango smoothie   - 6 PM gyro steak, pita bread, sour cream, cucumbers and yogurt sauce; oreo snack bag (100 calories) with water  - 9 PM steak (from gyro) with cucumbers and yogurt sauce, bbq chips, water  Exercise: Patient reports struggling with exercise and wanting to do it more.  We discussed that weight loss is 90% diet, 10% exercise.  That being said exercise will make her feel much better.  We discussed small measurable goals that she can make such as walking for 30 minutes 3 times a week.  Patient is interested in trying to implement this goal.  Chest pain: Patient reports chest pain under her sternum that is burning  and aching in nature going on for 2 days.  She describes it as her "usual acid reflux symptoms."  She is not on any acid blockers at this time.  She states that using some Tums help make it feel better but the acid reflux came back.  She is interested in using some medication to help her symptoms.   PERTINENT  PMH / PSH: DMT2, BMI 45  OBJECTIVE:   BP 128/84   Pulse (!) 111   Ht 5\' 4"  (1.626 m)   Wt 262 lb 12.8 oz (119.2 kg)   SpO2 97%   BMI 45.11 kg/m   Physical Exam Vitals and nursing note reviewed.  Constitutional:      General: She is not in acute distress.    Appearance: Normal appearance. She is obese. She is not ill-appearing, toxic-appearing or diaphoretic.  HENT:     Head: Normocephalic and atraumatic.  Eyes:     Conjunctiva/sclera: Conjunctivae normal.  Cardiovascular:     Rate and Rhythm: Regular rhythm. Tachycardia present.     Pulses: Normal pulses.     Heart sounds: Normal heart sounds. No murmur heard.  No friction rub. No gallop.   Pulmonary:     Effort: Pulmonary effort is normal. No respiratory distress.     Breath sounds: Normal breath sounds. No stridor. No wheezing, rhonchi or rales.  Abdominal:  General: Abdomen is flat.     Palpations: Abdomen is soft.  Skin:    General: Skin is warm and dry.  Neurological:     General: No focal deficit present.     Mental Status: She is alert. Mental status is at baseline.  Psychiatric:        Mood and Affect: Mood normal.        Behavior: Behavior normal.    ASSESSMENT/PLAN:   Type 2 diabetes mellitus without complication, without long-term current use of insulin (HCC) Patient doing well with Victoza. BG avg 130. Will recheck A1c in November.  Morbid obesity [facility]) Patient seeing nutritionist for DM. In the action phase of change. She is writing down her food, planning her meals and counting carbs. Patient still eats foods that are mostly carbohydrates. Discussed importance of shifting focus of food balance  to be primarily fruit and vegetables: nutrient-dense instead of calorically dense. We identified several areas of change: eating breakfast with fruits and protein to keep her from feeling hungry longer, more vegetables to fill up. Also incorporating more exercise with goal of walking for 30 minutes x 3 per week.  GERD (gastroesophageal reflux disease) Patient reports long history of reflux with chest pain x 2 days that feels like her usual reflux symptoms (burning, aching pain, sub sternal). Patient is not currently on a medication, used tums which provided minimal relief. Patient ad full cardiac work up within the last year with stress test with low risk profile. EKG obtained showed sinus tachycardia, which resolved within 1 minute. Patient is very deconditioned and with movement becomes tachycardic, but quickly improves with rest. Discussed other behavior to improve symptoms: avoid trigger foods, don;t eat after 8 PM, raise head of bed 15-20*, weight loss. Patient expresses understanding.  - Famotidine 10 mg qd sent to pharmacy, can increase to BID or 20 mg if needed     Shirlean Mylar, MD [facility]

## 2020-05-15 NOTE — Assessment & Plan Note (Signed)
Patient reports long history of reflux with chest pain x 2 days that feels like her usual reflux symptoms (burning, aching pain, sub sternal). Patient is not currently on a medication, used tums which provided minimal relief. Patient ad full cardiac work up within the last year with stress test with low risk profile. EKG obtained showed sinus tachycardia, which resolved within 1 minute. Patient is very deconditioned and with movement becomes tachycardic, but quickly improves with rest. Discussed other behavior to improve symptoms: avoid trigger foods, don;t eat after 8 PM, raise head of bed 15-20*, weight loss. Patient expresses understanding.  - Famotidine 10 mg qd sent to pharmacy, can increase to BID or 20 mg if needed

## 2020-05-24 ENCOUNTER — Ambulatory Visit: Payer: Medicaid Other | Admitting: Adult Health

## 2020-05-25 ENCOUNTER — Ambulatory Visit: Payer: Medicaid Other | Admitting: Adult Health

## 2020-05-25 ENCOUNTER — Encounter: Payer: Self-pay | Admitting: Adult Health

## 2020-05-25 ENCOUNTER — Other Ambulatory Visit: Payer: Self-pay

## 2020-05-25 DIAGNOSIS — G4733 Obstructive sleep apnea (adult) (pediatric): Secondary | ICD-10-CM

## 2020-05-25 NOTE — Assessment & Plan Note (Signed)
Mild obstructive sleep apnea.  Patient education given on sleep apnea.  Patient would like to restart CPAP.  Plan  Patient Instructions  Wear CPAP each night , goal is to wear for at least 4hrs each night  Try to wear each night .  Work on healthy weight.  Do not drive if sleepy  Follow up in 6 months Dr. Vassie Loll or Mayson Sterbenz NP and As needed

## 2020-05-25 NOTE — Progress Notes (Signed)
@Patient  ID: , female    DOB: 06-03-1986, 34 y.o.   MRN: [id number]  Chief Complaint  Patient presents with  . Follow-up    OSA     Referring provider: [id number], MD  HPI: 34 yo female seen for sleep consult 10/26/19 for evaluation for OSA found to have Mild OSA   TEST/EVENTS :  NPSG 10/2018-weight 272 pounds-AHI 10/hour, lowest desaturation 85%, supine AHI 13/hour, REM AHI 51/hour  05/25/2020 Follow up : OSA  Patient returns for follow up visit for OSA.  Patient has mild obstructive sleep apnea is on nocturnal CPAP.  Patient says she continues to have trouble wearing her mask.  She feels like it is uncomfortable.  CPAP download shows very poor compliance.  Patient education on obstructive sleep apnea and potential complications of untreated sleep apnea. Says she did not like the new nasal mask. Says she does not want to wear it, it is aggravating.  We talked about the things that she does not like about it.  Patient education was given.  She says that she would like to try it again and try to slowly start building herself back to wearing it each night for several hours. She says she does not have much daytime sleepiness.  But does have some restless sleep.    No Known Allergies  Immunization History  Administered Date(s) Administered  . Influenza,inj,Quad PF,6+ Mos 07/21/2018  . Moderna SARS-COVID-2 Vaccination 02/23/2020, 03/23/2020  . Tdap 05/28/2016    Past Medical History:  Diagnosis Date  . Diverticulitis 03/15/2018  . GERD (gastroesophageal reflux disease) 2009  . Gestational diabetes 2008  . Headache   . History of gallstones 10/2015   s/p cholecystectomy 10/09/15  . Hypertension   . Morbid obesity (HCC) 10/09/2015  . Shortness of breath dyspnea   . Tobacco abuse 10/09/2015    Tobacco History: Social History   Tobacco Use  Smoking Status Former Smoker  . Packs/day: 0.20  . Years: 17.00  . Pack years: 3.40  . Types: Cigarettes  . Quit  date: 03/01/2018  . Years since quitting: 2.2  Smokeless Tobacco Never Used   Counseling given: Not Answered   Outpatient Medications Prior to Visit  Medication Sig Dispense Refill  . calcium-vitamin D (OSCAL WITH D) 500-200 MG-UNIT tablet Take 1 tablet by mouth 2 (two) times daily. 60 tablet 11  . cetirizine (ZYRTEC ALLERGY) 10 MG tablet Take 1 tablet (10 mg total) by mouth daily. 30 tablet 0  . famotidine (PEPCID) 10 MG tablet Take 1 tablet (10 mg total) by mouth daily. 30 tablet 1  . Insulin Pen Needle (NOVOFINE) 32G X 6 MM MISC Use to inject once daily, dispose after use 100 each 3  . liraglutide (VICTOZA) 18 MG/3ML SOPN 0.6 mg once daily for 1 week,then increase to 1.2 mg once daily 6 mL 3  . losartan (COZAAR) 25 MG tablet Take 1 tablet (25 mg total) by mouth at bedtime. 90 tablet 3  . metFORMIN (GLUCOPHAGE-XR) 500 MG 24 hr tablet Take 2 tablets (1,000 mg total) by mouth in the morning and at bedtime. 180 tablet 3  . nystatin cream (MYCOSTATIN) Apply to affected area 2 times daily 15 g 0  . simvastatin (ZOCOR) 20 MG tablet TAKE 1 TABLET BY MOUTH AT BEDTIME 90 tablet 3   Facility-Administered Medications Prior to Visit  Medication Dose Route Frequency Provider Last Rate Last Admin  . medroxyPROGESTERone (DEPO-PROVERA) injection 150 mg  150 mg Intramuscular Q90 days Wenzel,  Dimas Alexandria, PA-C   150 mg at 03/26/20 0920     Review of Systems:   Constitutional:   No  weight loss, night sweats,  Fevers, chills, fatigue, or  lassitude.  HEENT:   No headaches,  Difficulty swallowing,  Tooth/dental problems, or  Sore throat,                No sneezing, itching, ear ache, nasal congestion, post nasal drip,   CV:  No chest pain,  Orthopnea, PND, swelling in lower extremities, anasarca, dizziness, palpitations, syncope.   GI  No heartburn, indigestion, abdominal pain, nausea, vomiting, diarrhea, change in bowel habits, loss of appetite, bloody stools.   Resp: No shortness of breath with  exertion or at rest.  No excess mucus, no productive cough,  No non-productive cough,  No coughing up of blood.  No change in color of mucus.  No wheezing.  No chest wall deformity  Skin: no rash or lesions.  GU: no dysuria, change in color of urine, no urgency or frequency.  No flank pain, no hematuria   MS:  No joint pain or swelling.  No decreased range of motion.  No back pain.    Physical Exam  BP 112/74 (BP Location: Left Arm, Cuff Size: Large)   Pulse 100   Temp 98.1 F (36.7 C) (Temporal)   Ht 5\' 4"  (1.626 m)   Wt 264 lb 12.8 oz (120.1 kg)   SpO2 98%   BMI 45.45 kg/m   GEN: A/Ox3; pleasant , NAD, well nourished    HEENT:  [place]/AT,    NOSE-clear, THROAT-clear, no lesions, no postnasal drip or exudate noted. Class 2-3 MP airway   NECK:  Supple w/ fair ROM; no JVD; normal carotid impulses w/o bruits; no thyromegaly or nodules palpated; no lymphadenopathy.    RESP  Clear  P & A; w/o, wheezes/ rales/ or rhonchi. no accessory muscle use, no dullness to percussion  CARD:  RRR, no m/r/g, no peripheral edema, pulses intact, no cyanosis or clubbing.  GI:   Soft & nt; nml bowel sounds; no organomegaly or masses detected.   Musco: Warm bil, no deformities or joint swelling noted.   Neuro: alert, no focal deficits noted.    Skin: Warm, no lesions or rashes    Lab Results:   BNP No results found for: BNP  ProBNP No results found for: PROBNP  Imaging: No results found.   No results found for: NITRICOXIDE      Assessment & Plan:   OSA (obstructive sleep apnea) Mild obstructive sleep apnea.  Patient education given on sleep apnea.  Patient would like to restart CPAP.  Plan  Patient Instructions  Wear CPAP each night , goal is to wear for at least 4hrs each night  Try to wear each night .  Work on healthy weight.  Do not drive if sleepy  Follow up in 6 months Dr. or Enes Rokosz NP and As needed       Morbid obesity (HCC) Healthy weight loss  discussed     Vassie Loll, NP 05/25/2020

## 2020-05-25 NOTE — Patient Instructions (Addendum)
Wear CPAP each night , goal is to wear for at least 4hrs each night  Try to wear each night .  Work on healthy weight.  Do not drive if sleepy  Follow up in 6 months Dr. Vassie Loll or Howell Groesbeck NP and As needed

## 2020-05-25 NOTE — Assessment & Plan Note (Signed)
Healthy weight loss discussed 

## 2020-05-26 DIAGNOSIS — G4733 Obstructive sleep apnea (adult) (pediatric): Secondary | ICD-10-CM | POA: Diagnosis not present

## 2020-06-01 DIAGNOSIS — G4733 Obstructive sleep apnea (adult) (pediatric): Secondary | ICD-10-CM | POA: Diagnosis not present

## 2020-06-11 ENCOUNTER — Ambulatory Visit (INDEPENDENT_AMBULATORY_CARE_PROVIDER_SITE_OTHER): Payer: Medicaid Other | Admitting: *Deleted

## 2020-06-11 ENCOUNTER — Other Ambulatory Visit: Payer: Self-pay

## 2020-06-11 VITALS — BP 103/91 | HR 88 | Ht 64.0 in | Wt 264.3 lb

## 2020-06-11 DIAGNOSIS — Z3042 Encounter for surveillance of injectable contraceptive: Secondary | ICD-10-CM | POA: Diagnosis not present

## 2020-06-11 MED ORDER — MEDROXYPROGESTERONE ACETATE 150 MG/ML IM SUSP
150.0000 mg | Freq: Once | INTRAMUSCULAR | Status: AC
  Administered 2020-06-11: 150 mg via INTRAMUSCULAR

## 2020-06-11 NOTE — Progress Notes (Signed)
Dorothy Haynes here for Depo-Provera Injection. Injection administered without complication. Patient will return in 3 months for next injection between 08/27/20/and 09/10/2020. Next annual visit due 03/2021.   Kandee Escalante,RN 06/11/2020  9:18 AM

## 2020-06-14 NOTE — Progress Notes (Signed)
Patient was assessed and managed by nursing staff during this encounter. I have reviewed the chart and agree with the documentation and plan. I have also made any necessary editorial changes.  [place] Bing, MD 06/14/2020 9:40 PM

## 2020-07-02 DIAGNOSIS — G4733 Obstructive sleep apnea (adult) (pediatric): Secondary | ICD-10-CM | POA: Diagnosis not present

## 2020-07-09 NOTE — Progress Notes (Signed)
    SUBJECTIVE:   CHIEF COMPLAINT / HPI: diabetes f/u  DM: patient is not checking BG at home, recommended she do that. 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 Usually has fresh fruit for breakfast, salads with chicken for lunch and dinner. We discussed not giving up on goals and readjusting over time to adhere to a better diet. She agrees to set a goal to drink less sweet tea and more water. She reports no lows, compliant with victoza, no nausea or GI symptoms. Reports that her mother also has DMT2.  PERTINENT  PMH / PSH: DM, HLD  OBJECTIVE:   BP 128/80   Pulse 100   Wt 260 lb (117.9 kg)   SpO2 98%   BMI 44.63 kg/m   Physical Exam Vitals and nursing note reviewed.  Constitutional:      General: She is not in acute distress.    Appearance: Normal appearance. She is obese. She is not ill-appearing, toxic-appearing or diaphoretic.  HENT:     Head: Normocephalic and atraumatic.  Cardiovascular:     Rate and Rhythm: Normal rate and regular rhythm.     Pulses: Normal pulses.     Heart sounds: Normal heart sounds.  Pulmonary:     Effort: Pulmonary effort is normal.     Breath sounds: Normal breath sounds.  Musculoskeletal:     Right lower leg: No edema.     Left lower leg: No edema.  Skin:    General: Skin is warm and dry.  Neurological:     General: No focal deficit present.     Mental Status: She is alert. Mental status is at baseline.  Psychiatric:        Mood and Affect: Mood normal.        Behavior: Behavior normal.    Diabetic Foot Exam - Simple   Simple Foot Form Diabetic Foot exam was performed with the following findings: Yes 07/10/2020  2:17 PM  Visual Inspection No deformities, no ulcerations, no other skin breakdown bilaterally: Yes Sensation Testing Intact to touch and monofilament testing bilaterally: Yes Pulse Check Posterior Tibialis and Dorsalis pulse intact  bilaterally: Yes Comments Normal foot exam    ASSESSMENT/PLAN:   Type 2 diabetes mellitus without complication, without long-term current use of insulin (HCC) Well controlled. No medication changes. Current Regimen: Victoza, metformin 1000mg  BID CBGs: unknown - doesn't check, recommend she checks, has glucometer Last A1c: 6.0 today (down from 6.9% 3 months ago)  Denies polyuria, polydipsia, hypoglycemia  Last Eye Exam: eye exam scheduled for January 2022 Statin: zocor 20 mg ACE/ARB: losartan 25mg  Discussed dieting and exercising at length. Patient is struggling with keeping up healthy changes, encouraged her to continue.    Hyperlipidemia Pt w/ DMT2, has morbid obesity. On simvastatin 20mg . Will check lipid panel today, last lipid panel in 06/2019. - lipid panel  Essential hypertension Hypertension: - Medications: well controlled with losartan - Compliance: good - Checking BP at home: occasionally, reports no highs - Denies any SOB, CP, vision changes, LE edema, medication SEs, or symptoms of hypotension - Diet: SAD - Exercise: walking at work Patient is at goal of 128/80. No changes to management.       , MD [facility]

## 2020-07-09 NOTE — Assessment & Plan Note (Addendum)
Well controlled. No medication changes. Current Regimen: Victoza, metformin 1000mg  BID CBGs: unknown - doesn't check, recommend she checks, has glucometer Last A1c: 6.0 today (down from 6.9% 3 months ago)  Denies polyuria, polydipsia, hypoglycemia  Last Eye Exam: eye exam scheduled for January 2022 Statin: zocor 20 mg ACE/ARB: losartan 25mg  Discussed dieting and exercising at length. Patient is struggling with keeping up healthy changes, encouraged her to continue.

## 2020-07-10 ENCOUNTER — Encounter: Payer: Self-pay | Admitting: Family Medicine

## 2020-07-10 ENCOUNTER — Other Ambulatory Visit: Payer: Self-pay

## 2020-07-10 ENCOUNTER — Ambulatory Visit (INDEPENDENT_AMBULATORY_CARE_PROVIDER_SITE_OTHER): Payer: Medicaid Other | Admitting: Family Medicine

## 2020-07-10 VITALS — BP 128/80 | HR 100 | Wt 260.0 lb

## 2020-07-10 DIAGNOSIS — E119 Type 2 diabetes mellitus without complications: Secondary | ICD-10-CM

## 2020-07-10 DIAGNOSIS — E1169 Type 2 diabetes mellitus with other specified complication: Secondary | ICD-10-CM | POA: Diagnosis not present

## 2020-07-10 DIAGNOSIS — I10 Essential (primary) hypertension: Secondary | ICD-10-CM

## 2020-07-10 DIAGNOSIS — E785 Hyperlipidemia, unspecified: Secondary | ICD-10-CM

## 2020-07-10 DIAGNOSIS — E782 Mixed hyperlipidemia: Secondary | ICD-10-CM

## 2020-07-10 LAB — POCT GLYCOSYLATED HEMOGLOBIN (HGB A1C): Hemoglobin A1C: 6.1 % — AB (ref 4.0–5.6)

## 2020-07-10 NOTE — Patient Instructions (Signed)
It was a pleasure to see you today!  I recommend checking your blood glucose at least once or twice a week. You are doing a good job with the medication. In order to get rid of diabetes, keep working on the diet. I recommend small changes like drinking less sweet tea until you only have sweet tea once a week.   We will do blood work today to check your lipid panel (cholesterol). I will let you know by letter if everything is normal, if any medication changes need to be made, I will call you in a week.  Please follow up in 3 months for another diabetes follow up.  Be Well!  Dr. Leary Roca

## 2020-07-10 NOTE — Assessment & Plan Note (Signed)
Pt w/ DMT2, has morbid obesity. On simvastatin 20mg . Will check lipid panel today, last lipid panel in 06/2019. - lipid panel

## 2020-07-10 NOTE — Assessment & Plan Note (Signed)
Hypertension: - Medications: well controlled with losartan - Compliance: good - Checking BP at home: occasionally, reports no highs - Denies any SOB, CP, vision changes, LE edema, medication SEs, or symptoms of hypotension - Diet: SAD - Exercise: walking at work Patient is at goal of 128/80. No changes to management.

## 2020-07-11 LAB — LIPID PANEL
Chol/HDL Ratio: 3 ratio (ref 0.0–4.4)
Cholesterol, Total: 113 mg/dL (ref 100–199)
HDL: 38 mg/dL — ABNORMAL LOW (ref >39)
LDL Chol Calc (NIH): 34 mg/dL (ref 0–99)
Triglycerides: 271 mg/dL — ABNORMAL HIGH (ref 0–149)
VLDL Cholesterol Cal: 41 mg/dL — ABNORMAL HIGH (ref 5–40)

## 2020-08-01 DIAGNOSIS — G4733 Obstructive sleep apnea (adult) (pediatric): Secondary | ICD-10-CM | POA: Diagnosis not present

## 2020-08-11 ENCOUNTER — Other Ambulatory Visit: Payer: Self-pay | Admitting: Family Medicine

## 2020-08-11 DIAGNOSIS — E119 Type 2 diabetes mellitus without complications: Secondary | ICD-10-CM

## 2020-08-24 ENCOUNTER — Other Ambulatory Visit: Payer: Self-pay | Admitting: Family Medicine

## 2020-08-24 DIAGNOSIS — E119 Type 2 diabetes mellitus without complications: Secondary | ICD-10-CM

## 2020-08-27 ENCOUNTER — Ambulatory Visit (INDEPENDENT_AMBULATORY_CARE_PROVIDER_SITE_OTHER): Payer: Medicaid Other | Admitting: Lactation Services

## 2020-08-27 ENCOUNTER — Other Ambulatory Visit: Payer: Self-pay

## 2020-08-27 ENCOUNTER — Encounter: Payer: Self-pay | Admitting: Lactation Services

## 2020-08-27 VITALS — BP 137/93 | HR 87 | Ht 63.0 in | Wt 263.8 lb

## 2020-08-27 DIAGNOSIS — Z3042 Encounter for surveillance of injectable contraceptive: Secondary | ICD-10-CM | POA: Diagnosis not present

## 2020-08-27 MED ORDER — MEDROXYPROGESTERONE ACETATE 150 MG/ML IM SUSP
150.0000 mg | Freq: Once | INTRAMUSCULAR | Status: AC
  Administered 2020-08-27: 10:00:00 150 mg via INTRAMUSCULAR

## 2020-08-27 NOTE — Progress Notes (Signed)
Dorothy Haynes here for Depo-Provera Injection. Injection administered without complication. Patient will return in 3 months for next injection between November 12, 2020 and November 26, 2020. Next annual visit due 03/2021.   Patient with no questions or concerns. Patent BP slightly elevated. She has no concerns. She is taking BP medication and is being followed by her PCP.   Ed Blalock, RN 08/27/2020  9:53 AM

## 2020-08-27 NOTE — Telephone Encounter (Signed)
Patient is out of Metformin and is needing a refill as soon as possible. Thanks

## 2020-08-27 NOTE — Progress Notes (Signed)
Patient was assessed and managed by nursing staff during this encounter. I have reviewed the chart and agree with the documentation and plan.  Sharyon Cable, CNM 08/27/2020 1:21 PM

## 2020-09-06 DIAGNOSIS — G4733 Obstructive sleep apnea (adult) (pediatric): Secondary | ICD-10-CM | POA: Diagnosis not present

## 2020-09-14 ENCOUNTER — Other Ambulatory Visit: Payer: Self-pay | Admitting: Family Medicine

## 2020-09-14 DIAGNOSIS — E119 Type 2 diabetes mellitus without complications: Secondary | ICD-10-CM

## 2020-09-27 NOTE — Progress Notes (Shared)
[facility] - Clinic Note  10/01/2020     CHIEF COMPLAINT Patient presents for No chief complaint on file.   HISTORY OF PRESENT ILLNESS: Dorothy Haynes is a 35 y.o. female who presents to the clinic today for:   pt states her last A1c was in the 6's, she states she has been going to the gym every day to try and lose weight, but there are no changes with her overall health  Referring physician: Shirlean Mylar, MD [address] [place],  [place] [address]  HISTORICAL INFORMATION:   Selected notes from the MEDICAL RECORD NUMBER Referred by Dr. Jeneen Rinks for DM exam LEE:  Ocular Hx- PMH-DM (A1C: 7.9, metformin), HTN, smoker    CURRENT MEDICATIONS: No current outpatient medications on file. (Ophthalmic Drugs)   No current facility-administered medications for this visit. (Ophthalmic Drugs)   Current Outpatient Medications (Other)  Medication Sig  . VICTOZA 18 MG/3ML SOPN INJECT 0.6 MG SUBCUTANEOUSLY ONCE DAILY FOR 1 WEEK, THEN INCREASE TO INJECT 1.2 MG SUBCUTANEOUSLY ONCE DAILY  . calcium-vitamin D (OSCAL WITH D) 500-200 MG-UNIT tablet Take 1 tablet by mouth 2 (two) times daily. (Patient not taking: Reported on 08/27/2020)  . cetirizine (ZYRTEC ALLERGY) 10 MG tablet Take 1 tablet (10 mg total) by mouth daily. (Patient not taking: No sig reported)  . famotidine (PEPCID) 10 MG tablet Take 1 tablet (10 mg total) by mouth daily. (Patient not taking: No sig reported)  . Insulin Pen Needle (NOVOFINE) 32G X 6 MM MISC Use to inject once daily, dispose after use  . losartan (COZAAR) 25 MG tablet Take 1 tablet (25 mg total) by mouth at bedtime.  . metFORMIN (GLUCOPHAGE-XR) 500 MG 24 hr tablet TAKE 2 TABLETS BY MOUTH IN THE MORNING AND AT BEDTIME  . nystatin cream (MYCOSTATIN) Apply to affected area 2 times daily (Patient not taking: Reported on 08/27/2020)  . simvastatin (ZOCOR) 20 MG tablet TAKE 1 TABLET BY MOUTH AT BEDTIME   Current Facility-Administered  Medications (Other)  Medication Route  . medroxyPROGESTERone (DEPO-PROVERA) injection 150 mg Intramuscular      REVIEW OF SYSTEMS:    ALLERGIES No Known Allergies  PAST MEDICAL HISTORY Past Medical History:  Diagnosis Date  . Diverticulitis 03/15/2018  . GERD (gastroesophageal reflux disease) 2009  . Gestational diabetes 2008  . Headache   . History of gallstones 10/2015   s/p cholecystectomy 10/09/15  . Hypertension   . Morbid obesity (HCC) 10/09/2015  . Shortness of breath dyspnea   . Tobacco abuse 10/09/2015   Past Surgical History:  Procedure Laterality Date  . CHOLECYSTECTOMY N/A 10/09/2015   Procedure: LAPAROSCOPIC CHOLECYSTECTOMY ;  Surgeon: Claud Kelp, MD;  Location: [facility];  Service: General;  Laterality: N/A;  . WISDOM TOOTH EXTRACTION      FAMILY HISTORY Family History  Problem Relation Age of Onset  . Diabetes Mother   . Hypertension Mother   . Cataracts Mother   . Throat cancer Father   . Kidney Stones Father   . Alzheimer's disease Other   . Diabetes Other   . Hypertension Other   . Heart disease Other   . Heart disease Other   . Diabetes Other   . Hypertension Other   . Thyroid disease Other   . Diabetes Maternal Grandmother   . Diabetes Maternal Grandfather   . Blindness Maternal Aunt   . Amblyopia Neg Hx   . Glaucoma Neg Hx   . Macular degeneration Neg Hx   .  Retinal detachment Neg Hx   . Strabismus Neg Hx   . Retinitis pigmentosa Neg Hx     SOCIAL HISTORY Social History   Tobacco Use  . Smoking status: Former Smoker    Packs/day: 0.20    Years: 17.00    Pack years: 3.40    Types: Cigarettes    Quit date: 03/01/2018    Years since quitting: 2.5  . Smokeless tobacco: Never Used  Substance Use Topics  . Alcohol use: Yes    Comment: occ, 1-2 drink per week  . Drug use: No    Comment: previously used MJ, has been off recreational drugs         OPHTHALMIC EXAM:  Not recorded     IMAGING AND PROCEDURES  Imaging and  Procedures for @TODAY @           ASSESSMENT/PLAN:    ICD-10-CM   1. Diabetes mellitus type 2 without retinopathy (HCC)  E11.9   2. Retinal edema  H35.81   3. Essential hypertension  I10   4. Hypertensive retinopathy of both eyes  H35.033   5. Combined forms of age-related cataract of both eyes  H25.813     1,2. Diabetes mellitus, type 2 without retinopathy  - The incidence, risk factors for progression, natural history and treatment options for diabetic retinopathy  were discussed with patient.    - The need for close monitoring of blood glucose, blood pressure, and serum lipids, avoiding cigarette or any type of tobacco, and the need for long term follow up was also discussed with patient.  - f/u in 1 year, sooner prn  3,4. Hypertensive retinopathy OU  - discussed importance of tight BP control  - monitor  5. Mild Mixed form cataract OU  - The symptoms of cataract, surgical options, and treatments and risks were discussed with patient.  - discussed diagnosis and progression  - very mild/early -- not yet visually significant  - continue monitoring for now   Ophthalmic Meds Ordered this visit:  No orders of the defined types were placed in this encounter.      No follow-ups on file.  There are no Patient Instructions on file for this visit.  This document serves as a record of services personally performed by , MD, PhD. It was created on their behalf by Karie Chimera, COA, an ophthalmic technician. The creation of this record is the provider's dictation and/or activities during the visit.    Electronically signed by: Herby Abraham, COA @TODAY @ 8:29 AM   Abbreviations: M myopia (nearsighted); A astigmatism; H hyperopia (farsighted); P presbyopia; Mrx spectacle prescription;  CTL contact lenses; OD right eye; OS left eye; OU both eyes  XT exotropia; ET esotropia; PEK punctate epithelial keratitis; PEE punctate epithelial erosions; DES dry eye syndrome;  MGD meibomian gland dysfunction; ATs artificial tears; PFAT's preservative free artificial tears; NSC nuclear sclerotic cataract; PSC posterior subcapsular cataract; ERM epi-retinal membrane; PVD posterior vitreous detachment; RD retinal detachment; DM diabetes mellitus; DR diabetic retinopathy; NPDR non-proliferative diabetic retinopathy; PDR proliferative diabetic retinopathy; CSME clinically significant macular edema; DME diabetic macular edema; dbh dot blot hemorrhages; CWS cotton wool spot; POAG primary open angle glaucoma; C/D cup-to-disc ratio; HVF humphrey visual field; GVF goldmann visual field; OCT optical coherence tomography; IOP intraocular pressure; BRVO Branch retinal vein occlusion; CRVO central retinal vein occlusion; CRAO central retinal artery occlusion; BRAO branch retinal artery occlusion; RT retinal tear; SB scleral buckle; PPV pars plana vitrectomy; VH Vitreous hemorrhage; PRP panretinal laser  photocoagulation; IVK intravitreal kenalog; VMT vitreomacular traction; MH Macular hole;  NVD neovascularization of the disc; NVE neovascularization elsewhere; AREDS age related eye disease study; ARMD age related macular degeneration; POAG primary open angle glaucoma; EBMD epithelial/anterior basement membrane dystrophy; ACIOL anterior chamber intraocular lens; IOL intraocular lens; PCIOL posterior chamber intraocular lens; Phaco/IOL phacoemulsification with intraocular lens placement; [place] photorefractive keratectomy; LASIK laser assisted in situ keratomileusis; HTN hypertension; DM diabetes mellitus; COPD chronic obstructive pulmonary disease

## 2020-10-01 ENCOUNTER — Encounter (INDEPENDENT_AMBULATORY_CARE_PROVIDER_SITE_OTHER): Payer: Medicaid Other | Admitting: Ophthalmology

## 2020-10-01 DIAGNOSIS — I10 Essential (primary) hypertension: Secondary | ICD-10-CM

## 2020-10-01 DIAGNOSIS — H3581 Retinal edema: Secondary | ICD-10-CM

## 2020-10-01 DIAGNOSIS — H35033 Hypertensive retinopathy, bilateral: Secondary | ICD-10-CM

## 2020-10-01 DIAGNOSIS — H25813 Combined forms of age-related cataract, bilateral: Secondary | ICD-10-CM

## 2020-10-01 DIAGNOSIS — E119 Type 2 diabetes mellitus without complications: Secondary | ICD-10-CM

## 2020-10-09 ENCOUNTER — Other Ambulatory Visit (HOSPITAL_COMMUNITY)
Admission: RE | Admit: 2020-10-09 | Discharge: 2020-10-09 | Disposition: A | Discharge status: Home / Self Care | Payer: Medicaid Other | Source: Ambulatory Visit | Attending: Family Medicine | Admitting: Family Medicine

## 2020-10-09 ENCOUNTER — Other Ambulatory Visit: Payer: Self-pay

## 2020-10-09 ENCOUNTER — Encounter: Payer: Self-pay | Admitting: Family Medicine

## 2020-10-09 ENCOUNTER — Ambulatory Visit (INDEPENDENT_AMBULATORY_CARE_PROVIDER_SITE_OTHER): Payer: Medicaid Other | Admitting: Family Medicine

## 2020-10-09 VITALS — BP 132/94 | HR 95 | Ht 63.0 in | Wt 268.0 lb

## 2020-10-09 DIAGNOSIS — Z23 Encounter for immunization: Secondary | ICD-10-CM | POA: Diagnosis not present

## 2020-10-09 DIAGNOSIS — Z113 Encounter for screening for infections with a predominantly sexual mode of transmission: Secondary | ICD-10-CM

## 2020-10-09 DIAGNOSIS — E119 Type 2 diabetes mellitus without complications: Secondary | ICD-10-CM | POA: Diagnosis not present

## 2020-10-09 DIAGNOSIS — I10 Essential (primary) hypertension: Secondary | ICD-10-CM | POA: Diagnosis not present

## 2020-10-09 DIAGNOSIS — N898 Other specified noninflammatory disorders of vagina: Secondary | ICD-10-CM | POA: Diagnosis not present

## 2020-10-09 LAB — POCT WET PREP (WET MOUNT)
Clue Cells Wet Prep Whiff POC: POSITIVE
Trichomonas Wet Prep HPF POC: ABSENT

## 2020-10-09 LAB — POCT GLYCOSYLATED HEMOGLOBIN (HGB A1C): Hemoglobin A1C: 6.5 % — AB (ref 4.0–5.6)

## 2020-10-09 MED ORDER — VICTOZA 18 MG/3ML ~~LOC~~ SOPN: 1.2000 mg | PEN_INJECTOR | Freq: Every day | SUBCUTANEOUS | 6 refills | Status: DC

## 2020-10-09 MED ORDER — VICTOZA 18 MG/3ML ~~LOC~~ SOPN: 1.8000 mg | PEN_INJECTOR | Freq: Every day | SUBCUTANEOUS | 6 refills | Status: DC

## 2020-10-09 MED ORDER — METFORMIN HCL ER 500 MG PO TB24: 500.0000 mg | ORAL_TABLET | Freq: Every day | ORAL | 0 refills | Status: DC

## 2020-10-09 MED ORDER — INSULIN PEN NEEDLE 32G X 6 MM MISC: 1.0000 | Freq: Every day | 6 refills | Status: DC

## 2020-10-09 NOTE — Patient Instructions (Signed)
It was a pleasure to see you today!  1. We will get some labs today. I will let you know the results of the wet prep as soon as I have them. If they are abnormal or we need to do something about them, I will call you.  If they are normal, I will send you a message on MyChart (if it is active) or a letter in the mail.  If you don't hear from [place] in 2 weeks, please call the office  [phone].  2. For diabetes: you are doing so well with your medications! Your diabetes is well controlled at this point. I will increase victoza to 1.8mg  daily because you may get more benefit from weight loss at a higher dose. I also recommend diet and exercise to help this. Follow up in 3 months  3. You received the pneumonia vaccine today, it is recommended that you get this every 10 years if you have diabetes.  4. Ask your eye doctor to send me a report when you see them next week.   Be Well,  Dr. Leary Roca

## 2020-10-09 NOTE — Progress Notes (Signed)
[facility] - Clinic Note  10/15/2020     CHIEF COMPLAINT Patient presents for Retina Follow Up   HISTORY OF PRESENT ILLNESS: Dorothy Haynes is a 35 y.o. female who presents to the clinic today for:   HPI    Retina Follow Up    Patient presents with  Diabetic Retinopathy.  In both eyes.  This started weeks ago.  Severity is moderate.  Duration of weeks.  Since onset it is stable.  I, the attending physician,  performed the HPI with the patient and updated documentation appropriately.          Comments    A1c: 6.5 BS: Doesn't check Pt states vision is stable OU.  Pt denies eye pain.  Has Hx of floaters OU--denies any change.  Denies flashes of light.        Last edited by Rennis Chris, MD on 10/15/2020  2:19 PM. (History)    pt states vision is doing well  Referring physician: Shirlean Mylar, MD [address] [place],  [place] [address]  HISTORICAL INFORMATION:   Selected notes from the MEDICAL RECORD NUMBER Referred by Dr. Jeneen Rinks for DM exam LEE:  Ocular Hx- PMH-DM (A1C: 7.9, metformin), HTN, smoker    CURRENT MEDICATIONS: No current outpatient medications on file. (Ophthalmic Drugs)   No current facility-administered medications for this visit. (Ophthalmic Drugs)   Current Outpatient Medications (Other)  Medication Sig  . calcium-vitamin D (OSCAL WITH D) 500-200 MG-UNIT tablet Take 1 tablet by mouth 2 (two) times daily. (Patient not taking: Reported on 08/27/2020)  . cetirizine (ZYRTEC ALLERGY) 10 MG tablet Take 1 tablet (10 mg total) by mouth daily. (Patient not taking: No sig reported)  . famotidine (PEPCID) 10 MG tablet Take 1 tablet (10 mg total) by mouth daily. (Patient not taking: No sig reported)  . Insulin Pen Needle 32G X 6 MM MISC 1 each by Does not apply route daily.  Marland Kitchen liraglutide (VICTOZA) 18 MG/3ML SOPN Inject 1.8 mg into the skin daily.  Marland Kitchen losartan (COZAAR) 25 MG tablet Take 1 tablet (25 mg total) by mouth at bedtime.  .  metFORMIN (GLUCOPHAGE-XR) 500 MG 24 hr tablet Take 1 tablet (500 mg total) by mouth daily with breakfast.  . metroNIDAZOLE (FLAGYL) 500 MG tablet Take 1 tablet (500 mg total) by mouth 2 (two) times daily.  Marland Kitchen nystatin cream (MYCOSTATIN) Apply to affected area 2 times daily (Patient not taking: Reported on 08/27/2020)  . simvastatin (ZOCOR) 20 MG tablet TAKE 1 TABLET BY MOUTH AT BEDTIME   No current facility-administered medications for this visit. (Other)      REVIEW OF SYSTEMS: ROS    Positive for: Endocrine, Cardiovascular, Eyes   Negative for: Constitutional, Gastrointestinal, Neurological, Skin, Genitourinary, Musculoskeletal, HENT, Respiratory, Psychiatric, Allergic/Imm, Heme/Lymph   Last edited by Corrinne Eagle on 10/15/2020  1:35 PM. (History)       ALLERGIES No Known Allergies  PAST MEDICAL HISTORY Past Medical History:  Diagnosis Date  . Diverticulitis 03/15/2018  . GERD (gastroesophageal reflux disease) 2009  . Gestational diabetes 2008  . Headache   . History of gallstones 10/2015   s/p cholecystectomy 10/09/15  . Hypertension   . Morbid obesity (HCC) 10/09/2015  . Shortness of breath dyspnea   . Tobacco abuse 10/09/2015   Past Surgical History:  Procedure Laterality Date  . CHOLECYSTECTOMY N/A 10/09/2015   Procedure: LAPAROSCOPIC CHOLECYSTECTOMY ;  Surgeon: Claud Kelp, MD;  Location: [facility];  Service: General;  Laterality: N/A;  . WISDOM TOOTH EXTRACTION      FAMILY HISTORY Family History  Problem Relation Age of Onset  . Diabetes Mother   . Hypertension Mother   . Cataracts Mother   . Throat cancer Father   . Kidney Stones Father   . Alzheimer's disease Other   . Diabetes Other   . Hypertension Other   . Heart disease Other   . Heart disease Other   . Diabetes Other   . Hypertension Other   . Thyroid disease Other   . Diabetes Maternal Grandmother   . Diabetes Maternal Grandfather   . Blindness Maternal Aunt   . Amblyopia Neg Hx   . Glaucoma  Neg Hx   . Macular degeneration Neg Hx   . Retinal detachment Neg Hx   . Strabismus Neg Hx   . Retinitis pigmentosa Neg Hx     SOCIAL HISTORY Social History   Tobacco Use  . Smoking status: Former Smoker    Packs/day: 0.20    Years: 17.00    Pack years: 3.40    Types: Cigarettes    Quit date: 03/01/2018    Years since quitting: 2.6  . Smokeless tobacco: Never Used  Substance Use Topics  . Alcohol use: Yes    Comment: occ, 1-2 drink per week  . Drug use: No    Comment: previously used MJ, has been off recreational drugs         OPHTHALMIC EXAM:  Base Eye Exam    Visual Acuity (Snellen - Linear)      Right Left   Dist [place] 20/20 20/20       Tonometry (Tonopen, 1:40 PM)      Right Left   Pressure 21 20       Pupils      Dark Light Shape React APD   Right 3 2 Round Brisk 0   Left 3 2 Round Brisk 0       Visual Fields      Left Right    Full Full       Extraocular Movement      Right Left    Full Full       Neuro/Psych    Oriented x3: Yes   Mood/Affect: Normal       Dilation    Both eyes: 1.0% Mydriacyl, 2.5% Phenylephrine @ 1:40 PM        Slit Lamp and Fundus Exam    External Exam      Right Left   External Normal Normal       Slit Lamp Exam      Right Left   Lids/Lashes Normal Normal   Conjunctiva/Sclera mild Melanosis mild Melanosis   Cornea Clear Trace Punctate epithelial erosions   Anterior Chamber deep and clear deep and clear   Iris Round and dilated Round and dilated   Lens 1+ Nuclear sclerosis, 1-2+ Cortical cataract 1+ Nuclear sclerosis, 1-2+ Cortical cataract   Vitreous Mild Vitreous syneresis Normal       Fundus Exam      Right Left   Disc Pink and Sharp, no NVD Pink and Sharp   C/D Ratio 0.3 0.4   Macula Flat Good foveal reflex, mild Retinal pigment epithelial mottling, No heme or edema Flat, Good foveal reflex, mild Retinal pigment epithelial mottling, No heme or edema   Vessels mild attenuation, mild tortuousity mild  attenuation, mild tortuousity   Periphery Attached, pigmented peripheral cystoid degenration greatest inferiorly, No heme  Attached, pigmented peripheral cystoid degenration greatest inferiorly, No heme           IMAGING AND PROCEDURES  Imaging and Procedures for @TODAY @  OCT, Retina - OU - Both Eyes       Right Eye Quality was good. Central Foveal Thickness: 248. Progression has been stable. Findings include normal foveal contour, no IRF, no SRF.   Left Eye Quality was good. Central Foveal Thickness: 246. Progression has been stable. Findings include normal foveal contour, no IRF, no SRF.   Notes *Images captured and stored on drive  Diagnosis / Impression:  NFP; no IRF/SRF OU No DME OU  Clinical management:  See below  Abbreviations: NFP - Normal foveal profile. CME - cystoid macular edema. PED - pigment epithelial detachment. IRF - intraretinal fluid. SRF - subretinal fluid. EZ - ellipsoid zone. ERM - epiretinal membrane. ORA - outer retinal atrophy. ORT - outer retinal tubulation. SRHM - subretinal hyper-reflective material\                 ASSESSMENT/PLAN:    ICD-10-CM   1. Diabetes mellitus type 2 without retinopathy (HCC)  E11.9   2. Retinal edema  H35.81 OCT, Retina - OU - Both Eyes  3. Essential hypertension  I10   4. Hypertensive retinopathy of both eyes  H35.033   5. Combined forms of age-related cataract of both eyes  H25.813     1,2. Diabetes mellitus, type 2 without retinopathy  - A1c 6.5% on 2.8.22  - The incidence, risk factors for progression, natural history and treatment options for diabetic retinopathy  were discussed with patient.    - The need for close monitoring of blood glucose, blood pressure, and serum lipids, avoiding cigarette or any type of tobacco, and the need for long term follow up was also discussed with patient.  - f/u in 1 year, sooner prn  3,4. Hypertensive retinopathy OU  - discussed importance of tight BP control  -  monitor  5. Mild Mixed form cataract OU  - The symptoms of cataract, surgical options, and treatments and risks were discussed with patient.  - discussed diagnosis and progression  - very mild/early -- not yet visually significant  - continue monitoring for now   Ophthalmic Meds Ordered this visit:  No orders of the defined types were placed in this encounter.      Return in about 1 year (around 10/15/2021) for f/u DM exam, DFE, OCT.  There are no Patient Instructions on file for this visit.  This document serves as a record of services personally performed by 10/17/2021, MD, PhD. It was created on their behalf by Karie Chimera, COA, an ophthalmic technician. The creation of this record is the provider's dictation and/or activities during the visit.    Electronically signed by: Herby Abraham, COA @TODAY @ 11:24 PM   This document serves as a record of services personally performed by Herby Abraham, MD, PhD. It was created on their behalf by . Karie Chimera, OA an ophthalmic technician. The creation of this record is the provider's dictation and/or activities during the visit.    Electronically signed by: Glee Arvin. Manson Passey 02.14.2022 11:24 PM  Kristopher Oppenheim, M.D., Ph.D. Diseases & Surgery of the Retina and Vitreous [facility] 10/15/2020   I have reviewed the above documentation for accuracy and completeness, and I agree with the above. [facility], M.D., Ph.D. 10/15/20 11:24 PM   Abbreviations: M myopia (nearsighted); A  astigmatism; H hyperopia (farsighted); P presbyopia; Mrx spectacle prescription;  CTL contact lenses; OD right eye; OS left eye; OU both eyes  XT exotropia; ET esotropia; PEK punctate epithelial keratitis; PEE punctate epithelial erosions; DES dry eye syndrome; MGD meibomian gland dysfunction; ATs artificial tears; PFAT's preservative free artificial tears; [place] nuclear sclerotic cataract; PSC posterior subcapsular cataract; ERM  epi-retinal membrane; PVD posterior vitreous detachment; RD retinal detachment; DM diabetes mellitus; DR diabetic retinopathy; NPDR non-proliferative diabetic retinopathy; PDR proliferative diabetic retinopathy; CSME clinically significant macular edema; DME diabetic macular edema; dbh dot blot hemorrhages; CWS cotton wool spot; POAG primary open angle glaucoma; C/D cup-to-disc ratio; HVF humphrey visual field; GVF goldmann visual field; OCT optical coherence tomography; IOP intraocular pressure; BRVO Branch retinal vein occlusion; CRVO central retinal vein occlusion; CRAO central retinal artery occlusion; BRAO branch retinal artery occlusion; RT retinal tear; SB scleral buckle; PPV pars plana vitrectomy; VH Vitreous hemorrhage; PRP panretinal laser photocoagulation; IVK intravitreal kenalog; VMT vitreomacular traction; MH Macular hole;  NVD neovascularization of the disc; NVE neovascularization elsewhere; AREDS age related eye disease study; ARMD age related macular degeneration; POAG primary open angle glaucoma; EBMD epithelial/anterior basement membrane dystrophy; ACIOL anterior chamber intraocular lens; IOL intraocular lens; PCIOL posterior chamber intraocular lens; Phaco/IOL phacoemulsification with intraocular lens placement; [place] photorefractive keratectomy; LASIK laser assisted in situ keratomileusis; HTN hypertension; DM diabetes mellitus; COPD chronic obstructive pulmonary disease

## 2020-10-09 NOTE — Progress Notes (Unsigned)
    SUBJECTIVE:   CHIEF COMPLAINT / HPI: DM f/u  DM: Patient reports that BG at home has been appropriate 100s-150s, no hypoglycemic episodes. Will obtain A1c today. She is going for ophthalmology exam in 1 week, asked her to have physician send report to me. Will get prevnar 23 vaccine today.  HTN: patient reports that her BP at home is usually <130/80; just above goal today in office at 132/94. Repeat improved to 128/82. Currently on losartan 25 mg.   Metabolic syndrome: BMI is 47, patient successfully using Victoza 1.2mg  for diabetes, well controlled. She has not had significant weight loss, but expresses desire and plans to increase exercise and work on diet. She does have option of increasing victoza for    Vaginal discharge: patient has had itching and brown discharge for 1 week. She tried monistat OTC over the weekend without improvement. She is concerned that she has a yeast infection due to DM, but DM is well controlled, making this less likely. Recommend obtaining wet prep to treat specific organism. She is interested in GC/CT testing today as well. UTD on pap smear, 03/2020.  PERTINENT  PMH / PSH: DMT2, metabolic syndrome  OBJECTIVE:   BP (!) 132/94   Pulse 95   Ht 5\' 3"  (1.6 m)   Wt 268 lb (121.6 kg)   SpO2 99%   BMI 47.47 kg/m   Nursing note and vitals reviewed GEN: age-appropriate, AAW, resting comfortably in chair, NAD, morbid obesity HEENT: NCAT. Sclera without injection or icterus.  Cardiac: Regular rate and rhythm. Normal S1/S2. No murmurs, rubs, or gallops appreciated. 2+ radial pulses. Lungs: Clear bilaterally to ascultation. No increased WOB, no accessory muscle usage. No w/r/r. Neuro: Alert and at baseline PELVIC:  Normal appearing external female genitalia, normal vaginal epithelium, no abnormal discharge.  Ext: no edema Psych: Pleasant and appropriate   ASSESSMENT/PLAN:   Type 2 diabetes mellitus without complication, without long-term current use of  insulin (HCC) Well controlled. Current Regimen: Victoza, metformin CBGs: appropriate  Last A1c: 6.5% on 10/09/20  Denies polyuria, polydipsia, hypoglycemia  Last Eye Exam: last year, next appt on 10/15/20 Statin: yes ACE/ARB: yes No change in meds for DM, will increase victoza for weight loss, see below. Follow up 3 months for next A1c.    Essential hypertension On Losartan 25 mg. Mildly above goal at 132/94 at first check in office today, repeat at goal of <130/80. Can increase losartan if needed, but patient reports being at goal at home. Will continue to monitor.  Morbid obesity (HCC) With metabolic syndrome. Patient reports desire to lose weight and interest in increasing exercise and dieting. She has good control of diabetes with victoza, but desires more weight loss. Will increase victoza to 1.8 mg qweekly and assess for weight loss. -follow up 1-3 months  Vaginal discharge Patient with 1 week of itching and vaginal discharge that did not respond to monistat OTC. Wet prep today shows BV with clue cells and + whiff test. Will treat with flagyl 500 mg BID x 7 days. GC/CT obtained and negative.     10/17/20, MD [facility]

## 2020-10-10 LAB — CERVICOVAGINAL ANCILLARY ONLY
Chlamydia: NEGATIVE
Comment: NEGATIVE
Comment: NORMAL
Neisseria Gonorrhea: NEGATIVE

## 2020-10-10 NOTE — Assessment & Plan Note (Addendum)
With metabolic syndrome. Patient reports desire to lose weight and interest in increasing exercise and dieting. She has good control of diabetes with victoza, but desires more weight loss. Will increase victoza to 1.8 mg qweekly and assess for weight loss. -follow up 1-3 months

## 2020-10-10 NOTE — Assessment & Plan Note (Signed)
On Losartan 25 mg. Mildly above goal at 132/94 at first check in office today, repeat at goal of <130/80. Can increase losartan if needed, but patient reports being at goal at home. Will continue to monitor.

## 2020-10-10 NOTE — Assessment & Plan Note (Addendum)
Well controlled. Current Regimen: Victoza, metformin CBGs: appropriate  Last A1c: 6.5% on 10/09/20  Denies polyuria, polydipsia, hypoglycemia  Last Eye Exam: last year, next appt on 10/15/20 Statin: yes ACE/ARB: yes No change in meds for DM, will increase victoza for weight loss, see below. Follow up 3 months for next A1c.

## 2020-10-10 NOTE — Assessment & Plan Note (Signed)
Patient with 1 week of itching and vaginal discharge that did not respond to monistat OTC. Wet prep today shows BV with clue cells and + whiff test. Will treat with flagyl 500 mg BID x 7 days. GC/CT obtained and negative.

## 2020-10-11 ENCOUNTER — Telehealth: Payer: Self-pay

## 2020-10-11 DIAGNOSIS — B9689 Other specified bacterial agents as the cause of diseases classified elsewhere: Secondary | ICD-10-CM

## 2020-10-11 NOTE — Telephone Encounter (Signed)
Patient calls nurse line regarding prescription for flagyl. Per OV note, patient was going to receive flagyl BID for seven days.   Please advise if medication can be sent in to [facility] on [address].   Veronda Prude, RN

## 2020-10-12 ENCOUNTER — Encounter: Payer: Self-pay | Admitting: Family Medicine

## 2020-10-14 MED ORDER — METRONIDAZOLE 500 MG PO TABS: 500.0000 mg | ORAL_TABLET | Freq: Two times a day (BID) | ORAL | 0 refills | Status: DC

## 2020-10-14 NOTE — Telephone Encounter (Signed)
Flagyl 500 mg BID x7 d sent to [facility] on [address].  Shirlean Mylar, MD [facility], PGY-2

## 2020-10-15 ENCOUNTER — Encounter (INDEPENDENT_AMBULATORY_CARE_PROVIDER_SITE_OTHER): Payer: Self-pay | Admitting: Ophthalmology

## 2020-10-15 ENCOUNTER — Other Ambulatory Visit: Payer: Self-pay

## 2020-10-15 ENCOUNTER — Ambulatory Visit (INDEPENDENT_AMBULATORY_CARE_PROVIDER_SITE_OTHER): Payer: Medicaid Other | Admitting: Ophthalmology

## 2020-10-15 DIAGNOSIS — H25813 Combined forms of age-related cataract, bilateral: Secondary | ICD-10-CM

## 2020-10-15 DIAGNOSIS — I10 Essential (primary) hypertension: Secondary | ICD-10-CM | POA: Diagnosis not present

## 2020-10-15 DIAGNOSIS — H3581 Retinal edema: Secondary | ICD-10-CM

## 2020-10-15 DIAGNOSIS — H35033 Hypertensive retinopathy, bilateral: Secondary | ICD-10-CM

## 2020-10-15 DIAGNOSIS — E119 Type 2 diabetes mellitus without complications: Secondary | ICD-10-CM | POA: Diagnosis not present

## 2020-10-19 ENCOUNTER — Telehealth: Payer: Self-pay

## 2020-10-19 ENCOUNTER — Encounter: Payer: Self-pay | Admitting: Family Medicine

## 2020-10-19 ENCOUNTER — Other Ambulatory Visit: Payer: Self-pay | Admitting: Family Medicine

## 2020-10-19 DIAGNOSIS — B3731 Acute candidiasis of vulva and vagina: Secondary | ICD-10-CM

## 2020-10-19 DIAGNOSIS — B373 Candidiasis of vulva and vagina: Secondary | ICD-10-CM

## 2020-10-19 MED ORDER — FLUCONAZOLE 150 MG PO TABS: 150.0000 mg | ORAL_TABLET | Freq: Once | ORAL | 0 refills | Status: AC

## 2020-10-19 NOTE — Telephone Encounter (Signed)
Patient LVM on nurse line requesting Diflucan for yeast infection. Patient is currently taking flagyl for BV, and states that she always gets a yeast infection after this treatment.   To PCP  Please advise.  Veronda Prude, RN

## 2020-11-22 ENCOUNTER — Ambulatory Visit: Payer: Medicaid Other | Admitting: Adult Health

## 2020-11-23 ENCOUNTER — Other Ambulatory Visit: Payer: Self-pay

## 2020-11-23 ENCOUNTER — Ambulatory Visit (INDEPENDENT_AMBULATORY_CARE_PROVIDER_SITE_OTHER): Payer: Medicaid Other

## 2020-11-23 VITALS — BP 148/86 | HR 98 | Wt 270.4 lb

## 2020-11-23 DIAGNOSIS — Z3042 Encounter for surveillance of injectable contraceptive: Secondary | ICD-10-CM | POA: Diagnosis not present

## 2020-11-23 MED ORDER — MEDROXYPROGESTERONE ACETATE 150 MG/ML IM SUSP
150.0000 mg | Freq: Once | INTRAMUSCULAR | Status: AC
  Administered 2020-11-23: 150 mg via INTRAMUSCULAR

## 2020-11-23 NOTE — Progress Notes (Signed)
Dorothy Haynes here for Depo-Provera  Injection.  Injection administered without complication. Patient will return in 3 months for next injection.  [name], CMA 11/23/2020  9:10 AM

## 2020-11-23 NOTE — Progress Notes (Signed)
Patient was assessed and managed by nursing staff during this encounter. I have reviewed the chart and agree with the documentation and plan. I have also made any necessary editorial changes.  Darrie Macmillan A Trevelle Mcgurn, MD 11/23/2020 11:44 AM   

## 2020-11-26 ENCOUNTER — Ambulatory Visit: Payer: Medicaid Other | Admitting: Adult Health

## 2020-11-26 ENCOUNTER — Ambulatory Visit: Payer: Medicaid Other | Admitting: Primary Care

## 2020-12-05 ENCOUNTER — Ambulatory Visit: Payer: Medicaid Other

## 2020-12-05 ENCOUNTER — Other Ambulatory Visit: Payer: Self-pay

## 2020-12-05 NOTE — Progress Notes (Signed)
[facility] Telemedicine Visit  Patient consented to have virtual visit and was identified by name and date of birth. Method of visit: Video  Encounter participants: Patient: Dorothy Haynes - located at work Provider: Dana Allan - located at home Others (if applicable): none  Chief Complaint: throat bothering   HPI:  Reports sore throat, cough, runny nose and subjective fevers that started last week. Symptoms started to get better but this Monday noticed voice more hoarse.  Denies any fevers, difficulty swallowing,decrease in appetite,SOB or difficulty breathing.  Endorses cough with intermittent clear/green mucus. Runny nose has subsided.  Reports her little cousin had similar symptoms that are now resolved. She has received 2 COVID vaccines and works at a [employer]. She reports her son has allergies so she thought this may be similar.  ROS: per HPI  Pertinent PMHx:  DM Type 2 OSA Tobacco use Elevated BMI  Exam:  There were no vitals taken for this visit.  Respiratory: Able to speak in full sentences, no SOB or IWOB noted   Assessment/Plan:  Viral illness Likely viral etiology given recent sick contact, subjective fever last week and symptoms slightly improved. Less bacterial etiology given resolving symptoms.  Also considered allergy component ngiven history of nasaln allergies.   -Symptom management -Trial Zyrtec 10 mg daily x14/7 -Strict return precautions provided  -Follow up with PCP DM management.    Time spent during visit with patient:  10 minutes

## 2020-12-06 ENCOUNTER — Telehealth (INDEPENDENT_AMBULATORY_CARE_PROVIDER_SITE_OTHER): Payer: Medicaid Other | Admitting: Family Medicine

## 2020-12-06 ENCOUNTER — Other Ambulatory Visit: Payer: Self-pay | Admitting: Family Medicine

## 2020-12-06 ENCOUNTER — Encounter: Payer: Self-pay | Admitting: Family Medicine

## 2020-12-06 DIAGNOSIS — Z3042 Encounter for surveillance of injectable contraceptive: Secondary | ICD-10-CM

## 2020-12-06 DIAGNOSIS — B349 Viral infection, unspecified: Secondary | ICD-10-CM

## 2020-12-06 DIAGNOSIS — E119 Type 2 diabetes mellitus without complications: Secondary | ICD-10-CM

## 2020-12-06 DIAGNOSIS — N760 Acute vaginitis: Secondary | ICD-10-CM

## 2020-12-06 DIAGNOSIS — B9689 Other specified bacterial agents as the cause of diseases classified elsewhere: Secondary | ICD-10-CM

## 2020-12-06 MED ORDER — METRONIDAZOLE 500 MG PO TABS: 1000.0000 mg | ORAL_TABLET | Freq: Two times a day (BID) | ORAL | 1 refills | Status: DC

## 2020-12-06 MED ORDER — CETIRIZINE HCL 10 MG PO TABS
10.0000 mg | ORAL_TABLET | Freq: Every day | ORAL | 0 refills | Status: DC
Start: 2020-12-06 — End: 2021-01-15

## 2020-12-06 MED ORDER — METFORMIN HCL ER 500 MG PO TB24: 500.0000 mg | ORAL_TABLET | Freq: Two times a day (BID) | ORAL | 0 refills | Status: DC

## 2020-12-06 NOTE — Assessment & Plan Note (Addendum)
Likely viral etiology given recent sick contact, subjective fever last week and symptoms slightly improved. Less bacterial etiology given resolving symptoms.  Also considered allergy component ngiven history of nasaln allergies.   -Symptom management -Trial Zyrtec 10 mg daily x14/7 -Strict return precautions provided  -Follow up with PCP DM management.

## 2020-12-12 ENCOUNTER — Telehealth: Payer: Self-pay

## 2020-12-12 NOTE — Telephone Encounter (Signed)
Yes that's fine. She recently changed medications so she should have a f/u in 3 months. Will likely need medication titration for DM.  Shirlean Mylar, MD [facility], PGY-2

## 2020-12-12 NOTE — Telephone Encounter (Signed)
Will discuss with provider tomorrow during clinic as there are still some discrepancies in medication dosing.   Will contact pharmacy once clarification has been obtained.   *Discussed with preceptor who agreed with this plan.   Veronda Prude, RN

## 2020-12-12 NOTE — Telephone Encounter (Signed)
Received phone call from pharmacist regarding recent rx for metronidazole. Rx is currently written for a one month supply with 1 refill. Pharmacist wanted to verify that patient was to stay on medication for longer course.   Please advise.   Veronda Prude, RN

## 2020-12-13 NOTE — Telephone Encounter (Signed)
Spoke with Dr. Leary Roca. Received instructions to cancel rx. Called pharmacy and canceled rx for metronidazole. Called patient to inform that she would need to schedule an appointment if she needed refill on metronidazole. Patient states that she does not need this refill, she just needed the metformin refilled.   However, patient does report that she is taking metformin differently that the directions on the rx from 4/7. Patient is taking two tablets of metformin 500 BID.   Please advise if new rx can be sent to pharmacy with updated directions.   Veronda Prude, RN

## 2020-12-14 ENCOUNTER — Other Ambulatory Visit: Payer: Self-pay | Admitting: Family Medicine

## 2020-12-14 DIAGNOSIS — E119 Type 2 diabetes mellitus without complications: Secondary | ICD-10-CM

## 2020-12-14 MED ORDER — METFORMIN HCL ER 500 MG PO TB24
1000.0000 mg | ORAL_TABLET | Freq: Two times a day (BID) | ORAL | 3 refills | Status: DC
Start: 2020-12-14 — End: 2021-04-20

## 2020-12-14 NOTE — Telephone Encounter (Signed)
Rx for metformin 500 mg, take 2 pills (1000mg ) BID, sent in to pharmacy.  , MD [facility], PGY-2

## 2021-01-15 ENCOUNTER — Other Ambulatory Visit: Payer: Self-pay

## 2021-01-15 ENCOUNTER — Encounter: Payer: Self-pay | Admitting: Family Medicine

## 2021-01-15 ENCOUNTER — Ambulatory Visit (INDEPENDENT_AMBULATORY_CARE_PROVIDER_SITE_OTHER): Payer: Medicaid Other | Admitting: Family Medicine

## 2021-01-15 VITALS — BP 140/88 | HR 98 | Ht 63.0 in | Wt 278.0 lb

## 2021-01-15 DIAGNOSIS — Z0000 Encounter for general adult medical examination without abnormal findings: Secondary | ICD-10-CM

## 2021-01-15 DIAGNOSIS — E1165 Type 2 diabetes mellitus with hyperglycemia: Secondary | ICD-10-CM | POA: Diagnosis not present

## 2021-01-15 DIAGNOSIS — I10 Essential (primary) hypertension: Secondary | ICD-10-CM | POA: Diagnosis not present

## 2021-01-15 DIAGNOSIS — Z9109 Other allergy status, other than to drugs and biological substances: Secondary | ICD-10-CM | POA: Diagnosis not present

## 2021-01-15 DIAGNOSIS — E119 Type 2 diabetes mellitus without complications: Secondary | ICD-10-CM | POA: Diagnosis not present

## 2021-01-15 LAB — POCT GLYCOSYLATED HEMOGLOBIN (HGB A1C): HbA1c, POC (controlled diabetic range): 7.9 % — AB (ref 0.0–7.0)

## 2021-01-15 MED ORDER — OZEMPIC (0.25 OR 0.5 MG/DOSE) 2 MG/1.5ML ~~LOC~~ SOPN: 0.2500 mg | PEN_INJECTOR | SUBCUTANEOUS | 1 refills | Status: DC

## 2021-01-15 MED ORDER — CETIRIZINE HCL 10 MG PO TABS: 10.0000 mg | ORAL_TABLET | Freq: Every day | ORAL | 0 refills | Status: DC

## 2021-01-15 MED ORDER — INSULIN PEN NEEDLE 32G X 6 MM MISC: 1.0000 | Freq: Every day | 6 refills | Status: DC

## 2021-01-15 NOTE — Assessment & Plan Note (Addendum)
Repeat BP 130/88. Well controlled today, continue losartan

## 2021-01-15 NOTE — Assessment & Plan Note (Addendum)
Not well controlled. Current Regimen: metformin 1000 mg BID CBGs: 250+ Last A1c: increased from 6.5% in February to 7.9% today in May Denies polyuria, polydipsia, hypoglycemia  Last Eye Exam: February 2022 Statin: not yet based on age and lipid panel ACE/ARB: yes ARB  Patient is interested in weight loss and needs more A1c control. Will start ozempic 0.25 mg qweekly. She has used victoza in the past without problem. Ozempic likely better choice to assist with weight loss as well. Can increase dose qmonthly as tolerated. Follow up for next A1c in 3 months.

## 2021-01-15 NOTE — Patient Instructions (Signed)
It was a pleasure to see you today!  1. Start ozempic 0.25 mg subcutaneous injection once weekly. Every month we will increase ozempic as you are able to tolerate it.   3. Follow up in August for next A1c check.   Be Well,  Dr. Leary Roca

## 2021-01-15 NOTE — Progress Notes (Signed)
    SUBJECTIVE:   CHIEF COMPLAINT / HPI: f/u DM  DM: fasting BGs elevated 150-200. Patient stopped using Victoza in April when she had gastroenteritis, now on metformin 1000mg  BID.  Last A1c 6.5% in February 2022, today elevated to 7.9%. She saw ophthalmology in February 2022. She is interested in weight loss.  PERTINENT  PMH / PSH: DM2  OBJECTIVE:   BP 140/88   Pulse 98   Ht 5\' 3"  (1.6 m)   Wt 278 lb (126.1 kg)   SpO2 99%   BMI 49.25 kg/m   Nursing note and vitals reviewed GEN: age-appropriate AAW, resting comfortably in chair, NAD, morbid obesity HEENT: NCAT. Sclera without injection or icterus. MMM.   Neuro: Alert and at baseline Ext: no edema Psych: Pleasant and appropriate   ASSESSMENT/PLAN:   Type 2 diabetes mellitus without complication, without long-term current use of insulin (HCC) Not well controlled. Current Regimen: metformin 1000 mg BID CBGs: 250+ Last A1c: increased from 6.5% in February to 7.9% today in May Denies polyuria, polydipsia, hypoglycemia  Last Eye Exam: February 2022 Statin: not yet based on age and lipid panel ACE/ARB: yes ARB  Patient is interested in weight loss and needs more A1c control. Will start ozempic 0.25 mg qweekly. She has used victoza in the past without problem. Ozempic likely better choice to assist with weight loss as well. Can increase dose qmonthly as tolerated. Follow up for next A1c in 3 months.   Essential hypertension Repeat BP 130/88. Well controlled today, continue losartan  Healthcare maintenance Discussed 3rd COVID shot. She got first two last summer. She prefers not to get any more after discussion.     March 2022, MD [facility]

## 2021-01-15 NOTE — Assessment & Plan Note (Signed)
Discussed 3rd COVID shot. She got first two last summer. She prefers not to get any more after discussion.

## 2021-01-17 ENCOUNTER — Telehealth: Payer: Self-pay

## 2021-01-17 NOTE — Telephone Encounter (Signed)
Received PA request for Ozempic. Please see below formulary for medicaid preferred medications.     Please advise if alternative can be sent into pharmacy or if you would like PA to be initiated.   Veronda Prude, RN

## 2021-01-18 ENCOUNTER — Telehealth: Payer: Self-pay

## 2021-01-18 NOTE — Telephone Encounter (Signed)
Pt called requesting that Dr. Leary Roca contact pharmacy to approve ozempic.

## 2021-01-18 NOTE — Telephone Encounter (Signed)
RN is working on the [place] for this medication.  They will contact patient when it has been addressed.  Dorothy Haynes,CMA

## 2021-01-20 ENCOUNTER — Encounter: Payer: Self-pay | Admitting: Family Medicine

## 2021-01-21 NOTE — Telephone Encounter (Signed)
Received fax from pharmacy, PA needed on Ozempic 0.25 mg .  Clinical questions submitted via Cover My Meds.  Waiting on response, could take up to 72 hours.  Cover My Meds info: Key: [id number]  Veronda Prude, RN

## 2021-01-21 NOTE — Telephone Encounter (Signed)
Received determination from insurance. Medication was denied due to the following: Per your health plan's criteria, this drug is covered if you meet the following: (1) You have tried or cannot use one preferred drug: Byetta Pen, Trulicity Pen.  Forwarding to PCP. Please advise.   Veronda Prude, RN

## 2021-01-23 NOTE — Telephone Encounter (Signed)
Ugh sadly it sounds like no from insurance response. That being said I think Trulicity would be just as good of an option. Also we can always try that and then if patient does not tolerate or has issues we can try for Ozempic again and they should then cover it at that point.

## 2021-01-26 NOTE — Telephone Encounter (Signed)
Thanks Rachelle, Will reach out to patient to discuss switch in medication.  Dana Allan, MD Family Medicine Residency

## 2021-01-29 ENCOUNTER — Other Ambulatory Visit: Payer: Self-pay | Admitting: Family Medicine

## 2021-01-29 DIAGNOSIS — E119 Type 2 diabetes mellitus without complications: Secondary | ICD-10-CM

## 2021-01-30 ENCOUNTER — Encounter: Payer: Self-pay | Admitting: Family Medicine

## 2021-02-08 ENCOUNTER — Telehealth: Payer: Self-pay | Admitting: Family Medicine

## 2021-02-08 ENCOUNTER — Encounter: Payer: Self-pay | Admitting: Family Medicine

## 2021-02-08 NOTE — Telephone Encounter (Signed)
Received follow up fax from pharmacy regarding PA request on Ozempic. This has already been denied. Please advise how patient should proceed with medication management.   Veronda Prude, RN

## 2021-02-08 NOTE — Telephone Encounter (Signed)
Opened in error

## 2021-02-11 ENCOUNTER — Other Ambulatory Visit: Payer: Self-pay

## 2021-02-11 ENCOUNTER — Ambulatory Visit (INDEPENDENT_AMBULATORY_CARE_PROVIDER_SITE_OTHER): Payer: Medicaid Other | Admitting: General Practice

## 2021-02-11 VITALS — BP 133/90 | HR 95 | Ht 63.0 in | Wt 278.0 lb

## 2021-02-11 DIAGNOSIS — Z3042 Encounter for surveillance of injectable contraceptive: Secondary | ICD-10-CM | POA: Diagnosis not present

## 2021-02-11 MED ORDER — MEDROXYPROGESTERONE ACETATE 150 MG/ML IM SUSP
150.0000 mg | Freq: Once | INTRAMUSCULAR | Status: AC
  Administered 2021-02-11: 150 mg via INTRAMUSCULAR

## 2021-02-11 NOTE — Progress Notes (Signed)
Dorothy Haynes here for Depo-Provera Injection. Injection administered without complication. Patient will return in 3 months for next injection between Aug 29 and Sept 12. Next annual visit due July 2022.   Marylynn Pearson, RN 02/11/2021  3:11 PM

## 2021-02-12 NOTE — Progress Notes (Signed)
Attestation of Attending Supervision of clinical support staff: I agree with the care provided to this patient and was available for any consultation.  I have reviewed the RN's note and chart. I was available for consult and to see the patient if needed.   Zuriel Roskos MD MPH Attending Physician Faculty Practice- [facility]  

## 2021-02-17 ENCOUNTER — Other Ambulatory Visit: Payer: Self-pay | Admitting: Family Medicine

## 2021-02-17 DIAGNOSIS — E119 Type 2 diabetes mellitus without complications: Secondary | ICD-10-CM

## 2021-02-17 DIAGNOSIS — I10 Essential (primary) hypertension: Secondary | ICD-10-CM

## 2021-02-18 ENCOUNTER — Other Ambulatory Visit: Payer: Self-pay | Admitting: Family Medicine

## 2021-02-18 DIAGNOSIS — Z9109 Other allergy status, other than to drugs and biological substances: Secondary | ICD-10-CM

## 2021-03-14 DIAGNOSIS — G4733 Obstructive sleep apnea (adult) (pediatric): Secondary | ICD-10-CM | POA: Diagnosis not present

## 2021-04-18 ENCOUNTER — Other Ambulatory Visit: Payer: Self-pay | Admitting: Family Medicine

## 2021-04-18 DIAGNOSIS — E119 Type 2 diabetes mellitus without complications: Secondary | ICD-10-CM

## 2021-04-23 DIAGNOSIS — G43009 Migraine without aura, not intractable, without status migrainosus: Secondary | ICD-10-CM | POA: Diagnosis not present

## 2021-04-23 DIAGNOSIS — R0789 Other chest pain: Secondary | ICD-10-CM | POA: Diagnosis not present

## 2021-04-23 DIAGNOSIS — R252 Cramp and spasm: Secondary | ICD-10-CM | POA: Diagnosis not present

## 2021-04-23 DIAGNOSIS — E785 Hyperlipidemia, unspecified: Secondary | ICD-10-CM | POA: Diagnosis not present

## 2021-04-23 DIAGNOSIS — I10 Essential (primary) hypertension: Secondary | ICD-10-CM | POA: Diagnosis not present

## 2021-04-23 DIAGNOSIS — R202 Paresthesia of skin: Secondary | ICD-10-CM | POA: Diagnosis not present

## 2021-04-23 DIAGNOSIS — G4733 Obstructive sleep apnea (adult) (pediatric): Secondary | ICD-10-CM | POA: Diagnosis not present

## 2021-04-23 DIAGNOSIS — E1165 Type 2 diabetes mellitus with hyperglycemia: Secondary | ICD-10-CM | POA: Diagnosis not present

## 2021-04-23 DIAGNOSIS — K219 Gastro-esophageal reflux disease without esophagitis: Secondary | ICD-10-CM | POA: Diagnosis not present

## 2021-04-28 ENCOUNTER — Other Ambulatory Visit: Payer: Self-pay | Admitting: Family Medicine

## 2021-04-28 DIAGNOSIS — E119 Type 2 diabetes mellitus without complications: Secondary | ICD-10-CM

## 2021-04-29 ENCOUNTER — Other Ambulatory Visit: Payer: Self-pay

## 2021-04-29 ENCOUNTER — Encounter: Payer: Self-pay | Admitting: Obstetrics and Gynecology

## 2021-04-29 ENCOUNTER — Ambulatory Visit (INDEPENDENT_AMBULATORY_CARE_PROVIDER_SITE_OTHER): Payer: Medicaid Other | Admitting: Obstetrics and Gynecology

## 2021-04-29 VITALS — BP 162/111 | HR 92 | Ht 63.0 in | Wt 272.8 lb

## 2021-04-29 DIAGNOSIS — Z3042 Encounter for surveillance of injectable contraceptive: Secondary | ICD-10-CM | POA: Diagnosis not present

## 2021-04-29 DIAGNOSIS — Z01419 Encounter for gynecological examination (general) (routine) without abnormal findings: Secondary | ICD-10-CM

## 2021-04-29 DIAGNOSIS — N921 Excessive and frequent menstruation with irregular cycle: Secondary | ICD-10-CM

## 2021-04-29 MED ORDER — FLUCONAZOLE 150 MG PO TABS: ORAL_TABLET | ORAL | 1 refills | Status: DC

## 2021-04-29 MED ORDER — MEDROXYPROGESTERONE ACETATE 150 MG/ML IM SUSP
150.0000 mg | Freq: Once | INTRAMUSCULAR | Status: AC
  Administered 2021-04-29: 150 mg via INTRAMUSCULAR

## 2021-04-29 NOTE — Progress Notes (Signed)
Dorothy Haynes is a 35 y.o. G1P0 female here for a routine annual gynecologic exam.  Current complaints: possible yeast infection.   Denies abnormal vaginal bleeding, discharge, pelvic pain, problems with intercourse or other gynecologic concerns.    Gynecologic History No LMP recorded (lmp unknown). Patient has had an injection. Contraception: Depo-Provera injections Last Pap: 03/2020. Results were: normal   Obstetric History OB History  Gravida Para Term Preterm AB Living  1         1  SAB IAB Ectopic Multiple Live Births          1    # Outcome Date GA Lbr Len/2nd Weight Sex Delivery Anes PTL Lv  1 Gravida             Past Medical History:  Diagnosis Date   Diverticulitis 03/15/2018   GERD (gastroesophageal reflux disease) 2009   Gestational diabetes 2008   Headache    History of gallstones 10/2015   s/p cholecystectomy 10/09/15   Hypertension    Morbid obesity ([place]) 10/09/2015   Shortness of breath dyspnea    Tobacco abuse 10/09/2015    Past Surgical History:  Procedure Laterality Date   CHOLECYSTECTOMY N/A 10/09/2015   Procedure: LAPAROSCOPIC CHOLECYSTECTOMY ;  Surgeon: Fanny Skates, MD;  Location: [facility];  Service: General;  Laterality: N/A;   WISDOM TOOTH EXTRACTION      Current Outpatient Medications on File Prior to Visit  Medication Sig Dispense Refill   ACCU-CHEK GUIDE test strip See admin instructions.     Accu-Chek Softclix Lancets lancets every morning.     Blood Glucose Monitoring Suppl (ACCU-CHEK GUIDE ME) w/Device KIT daily. as directed     calcium-vitamin D (OSCAL WITH D) 500-200 MG-UNIT tablet Take 1 tablet by mouth 2 (two) times daily. 60 tablet 11   cetirizine (ZYRTEC) 10 MG tablet Take 1 tablet by mouth once daily 30 tablet 11   cyclobenzaprine (FLEXERIL) 5 MG tablet Take 5 mg by mouth at bedtime as needed.     glimepiride (AMARYL) 2 MG tablet Take 2 mg by mouth daily.     Insulin Pen Needle 32G X 6 MM MISC 1 each by Does not apply route daily. 100  each 6   losartan (COZAAR) 25 MG tablet TAKE 1 TABLET BY MOUTH AT BEDTIME 90 tablet 1   metFORMIN (GLUCOPHAGE-XR) 500 MG 24 hr tablet Take 2 tablets by mouth twice daily 120 tablet 0   pantoprazole (PROTONIX) 40 MG tablet Take 40 mg by mouth daily.     simvastatin (ZOCOR) 20 MG tablet TAKE 1 TABLET BY MOUTH AT BEDTIME 90 tablet 0   SUMAtriptan (IMITREX) 100 MG tablet Take 100 mg by mouth as directed.     No current facility-administered medications on file prior to visit.    No Known Allergies  Social History   Socioeconomic History   Marital status: Single    Spouse name: Not on file   Number of children: 1   Years of education: college graduate   Highest education level: Not on file  Occupational History    Employer: [employer]    Comment: part-time  Tobacco Use   Smoking status: Former    Packs/day: 0.20    Years: 17.00    Pack years: 3.40    Types: Cigarettes    Quit date: 03/01/2018    Years since quitting: 3.1   Smokeless tobacco: Never  Substance and Sexual Activity   Alcohol use: Yes    Comment: occ,  1-2 drink per week   Drug use: No    Comment: previously used MJ, has been off recreational drugs   Sexual activity: Yes    Partners: Male    Birth control/protection: Injection  Other Topics Concern   Not on file  Social History Narrative   Lives with mother and son   Social Determinants of Health   Financial Resource Strain: Not on file  Food Insecurity: Food Insecurity Present   Worried About Charity fundraiser in the Last Year: Sometimes true   Ran Out of Food in the Last Year: Sometimes true  Transportation Needs: No Transportation Needs   Lack of Transportation (Medical): No   Lack of Transportation (Non-Medical): No  Physical Activity: Not on file  Stress: Not on file  Social Connections: Not on file  Intimate Partner Violence: Not on file    Family History  Problem Relation Age of Onset   Diabetes Mother    Hypertension Mother     Cataracts Mother    Throat cancer Father    Kidney Stones Father    Alzheimer's disease Other    Diabetes Other    Hypertension Other    Heart disease Other    Heart disease Other    Diabetes Other    Hypertension Other    Thyroid disease Other    Diabetes Maternal Grandmother    Diabetes Maternal Grandfather    Blindness Maternal Aunt    Amblyopia Neg Hx    Glaucoma Neg Hx    Macular degeneration Neg Hx    Retinal detachment Neg Hx    Strabismus Neg Hx    Retinitis pigmentosa Neg Hx     The following portions of the patient's history were reviewed and updated as appropriate: allergies, current medications, past family history, past medical history, past social history, past surgical history and problem list.  Review of Systems Pertinent items are noted in HPI.   Objective:  BP (!) 162/111   Pulse 92   Ht 5' 3"  (1.6 m)   Wt 272 lb 12.8 oz (123.7 kg)   LMP  (LMP Unknown)   BMI 48.32 kg/m  CONSTITUTIONAL: Well-developed, well-nourished female in no acute distress.  HENT:  Normocephalic, atraumatic, External right and left ear normal. Oropharynx is clear and moist EYES: Conjunctivae and EOM are normal. Pupils are equal, round, and reactive to light. No scleral icterus.  NECK: Normal range of motion, supple, no masses.  Normal thyroid.  SKIN: Skin is warm and dry. No rash noted. Not diaphoretic. No erythema. No pallor. [place]: Alert and oriented to person, place, and time. Normal reflexes, muscle tone coordination. No cranial nerve deficit noted. PSYCHIATRIC: Normal mood and affect. Normal behavior. Normal judgment and thought content. CARDIOVASCULAR: Normal heart rate noted, regular rhythm RESPIRATORY: Clear to auscultation bilaterally. Effort and breath sounds normal, no problems with respiration noted. BREASTS: Deferred ABDOMEN: Soft, normal bowel sounds, no distention noted.  No tenderness, rebound or guarding.  PELVIC: Deferred MUSCULOSKELETAL: Normal range of  motion. No tenderness.  No cyanosis, clubbing, or edema.  2+ distal pulses.   Assessment:  Annual gynecologic examination with pap smear  Yeast infection Contraception management Plan:  Will follow up results of pap smear and manage accordingly. Diflucan for yeast infection Continue with Depo Provera for contraception Continue to see PCP for HM Routine preventative health maintenance measures emphasized. Please refer to After Visit Summary for other counseling recommendations.    Chancy Milroy, MD, [facility] Attending Obstetrician & Gynecologist  [facility] [place]

## 2021-04-29 NOTE — Patient Instructions (Signed)
Health Maintenance, Female Adopting a healthy lifestyle and getting preventive care are important in promoting health and wellness. Ask your health care provider about: The right schedule for you to have regular tests and exams. Things you can do on your own to prevent diseases and keep yourself healthy. What should I know about diet, weight, and exercise? Eat a healthy diet  Eat a diet that includes plenty of vegetables, fruits, low-fat dairy products, and lean protein. Do not eat a lot of foods that are high in solid fats, added sugars, or sodium.  Maintain a healthy weight Body mass index (BMI) is used to identify weight problems. It estimates body fat based on height and weight. Your health care provider can help determineyour BMI and help you achieve or maintain a healthy weight. Get regular exercise Get regular exercise. This is one of the most important things you can do for your health. Most adults should: Exercise for at least 150 minutes each week. The exercise should increase your heart rate and make you sweat (moderate-intensity exercise). Do strengthening exercises at least twice a week. This is in addition to the moderate-intensity exercise. Spend less time sitting. Even light physical activity can be beneficial. Watch cholesterol and blood lipids Have your blood tested for lipids and cholesterol at 37 years of age, then havethis test every 5 years. Have your cholesterol levels checked more often if: Your lipid or cholesterol levels are high. You are older than 37 years of age. You are at high risk for heart disease. What should I know about cancer screening? Depending on your health history and family history, you may need to have cancer screening at various ages. This may include screening for: Breast cancer. Cervical cancer. Colorectal cancer. Skin cancer. Lung cancer. What should I know about heart disease, diabetes, and high blood pressure? Blood pressure and heart  disease High blood pressure causes heart disease and increases the risk of stroke. This is more likely to develop in people who have high blood pressure readings, are of African descent, or are overweight. Have your blood pressure checked: Every 3-5 years if you are 18-39 years of age. Every year if you are 40 years old or older. Diabetes Have regular diabetes screenings. This checks your fasting blood sugar level. Have the screening done: Once every three years after age 40 if you are at a normal weight and have a low risk for diabetes. More often and at a younger age if you are overweight or have a high risk for diabetes. What should I know about preventing infection? Hepatitis B If you have a higher risk for hepatitis B, you should be screened for this virus. Talk with your health care provider to find out if you are at risk forhepatitis B infection. Hepatitis C Testing is recommended for: Everyone born from 1945 through 1965. Anyone with known risk factors for hepatitis C. Sexually transmitted infections (STIs) Get screened for STIs, including gonorrhea and chlamydia, if: You are sexually active and are younger than 37 years of age. You are older than 37 years of age and your health care provider tells you that you are at risk for this type of infection. Your sexual activity has changed since you were last screened, and you are at increased risk for chlamydia or gonorrhea. Ask your health care provider if you are at risk. Ask your health care provider about whether you are at high risk for HIV. Your health care provider may recommend a prescription medicine to help   prevent HIV infection. If you choose to take medicine to prevent HIV, you should first get tested for HIV. You should then be tested every 3 months for as long as you are taking the medicine. Pregnancy If you are about to stop having your period (premenopausal) and you may become pregnant, seek counseling before you get  pregnant. Take 400 to 800 micrograms (mcg) of folic acid every day if you become pregnant. Ask for birth control (contraception) if you want to prevent pregnancy. Osteoporosis and menopause Osteoporosis is a disease in which the bones lose minerals and strength with aging. This can result in bone fractures. If you are 65 years old or older, or if you are at risk for osteoporosis and fractures, ask your health care provider if you should: Be screened for bone loss. Take a calcium or vitamin D supplement to lower your risk of fractures. Be given hormone replacement therapy (HRT) to treat symptoms of menopause. Follow these instructions at home: Lifestyle Do not use any products that contain nicotine or tobacco, such as cigarettes, e-cigarettes, and chewing tobacco. If you need help quitting, ask your health care provider. Do not use street drugs. Do not share needles. Ask your health care provider for help if you need support or information about quitting drugs. Alcohol use Do not drink alcohol if: Your health care provider tells you not to drink. You are pregnant, may be pregnant, or are planning to become pregnant. If you drink alcohol: Limit how much you use to 0-1 drink a day. Limit intake if you are breastfeeding. Be aware of how much alcohol is in your drink. In the [place] one drink equals one 12 oz bottle of beer (355 mL), one 5 oz glass of wine (148 mL), or one 1 oz glass of hard liquor (44 mL). General instructions Schedule regular health, dental, and eye exams. Stay current with your vaccines. Tell your health care provider if: You often feel depressed. You have ever been abused or do not feel safe at home. Summary Adopting a healthy lifestyle and getting preventive care are important in promoting health and wellness. Follow your health care provider's instructions about healthy diet, exercising, and getting tested or screened for diseases. Follow your health care provider's  instructions on monitoring your cholesterol and blood pressure. This information is not intended to replace advice given to you by your health care provider. Make sure you discuss any questions you have with your healthcare provider. Document Revised: 08/11/2018 Document Reviewed: 08/11/2018 Elsevier Patient Education  2022 Elsevier Inc.  

## 2021-05-21 ENCOUNTER — Other Ambulatory Visit: Payer: Self-pay | Admitting: Family Medicine

## 2021-05-21 DIAGNOSIS — E119 Type 2 diabetes mellitus without complications: Secondary | ICD-10-CM

## 2021-05-22 DIAGNOSIS — E785 Hyperlipidemia, unspecified: Secondary | ICD-10-CM | POA: Diagnosis not present

## 2021-05-22 DIAGNOSIS — D649 Anemia, unspecified: Secondary | ICD-10-CM | POA: Diagnosis not present

## 2021-05-22 DIAGNOSIS — R252 Cramp and spasm: Secondary | ICD-10-CM | POA: Diagnosis not present

## 2021-05-22 DIAGNOSIS — E1165 Type 2 diabetes mellitus with hyperglycemia: Secondary | ICD-10-CM | POA: Diagnosis not present

## 2021-05-24 DIAGNOSIS — D72829 Elevated white blood cell count, unspecified: Secondary | ICD-10-CM | POA: Diagnosis not present

## 2021-05-24 DIAGNOSIS — E1165 Type 2 diabetes mellitus with hyperglycemia: Secondary | ICD-10-CM | POA: Diagnosis not present

## 2021-05-24 DIAGNOSIS — D649 Anemia, unspecified: Secondary | ICD-10-CM | POA: Diagnosis not present

## 2021-05-24 DIAGNOSIS — N3001 Acute cystitis with hematuria: Secondary | ICD-10-CM | POA: Diagnosis not present

## 2021-05-24 DIAGNOSIS — D75839 Thrombocytosis, unspecified: Secondary | ICD-10-CM | POA: Diagnosis not present

## 2021-05-24 DIAGNOSIS — T466X5A Adverse effect of antihyperlipidemic and antiarteriosclerotic drugs, initial encounter: Secondary | ICD-10-CM | POA: Diagnosis not present

## 2021-05-24 DIAGNOSIS — E785 Hyperlipidemia, unspecified: Secondary | ICD-10-CM | POA: Diagnosis not present

## 2021-06-12 DIAGNOSIS — E1165 Type 2 diabetes mellitus with hyperglycemia: Secondary | ICD-10-CM | POA: Diagnosis not present

## 2021-06-12 DIAGNOSIS — R197 Diarrhea, unspecified: Secondary | ICD-10-CM | POA: Diagnosis not present

## 2021-06-12 DIAGNOSIS — E162 Hypoglycemia, unspecified: Secondary | ICD-10-CM | POA: Diagnosis not present

## 2021-07-22 ENCOUNTER — Ambulatory Visit (INDEPENDENT_AMBULATORY_CARE_PROVIDER_SITE_OTHER): Payer: Medicaid Other

## 2021-07-22 ENCOUNTER — Other Ambulatory Visit: Payer: Self-pay

## 2021-07-22 VITALS — BP 143/99 | HR 95 | Wt 278.0 lb

## 2021-07-22 DIAGNOSIS — Z3042 Encounter for surveillance of injectable contraceptive: Secondary | ICD-10-CM | POA: Diagnosis not present

## 2021-07-22 MED ORDER — MEDROXYPROGESTERONE ACETATE 150 MG/ML IM SUSP
150.0000 mg | Freq: Once | INTRAMUSCULAR | Status: AC
  Administered 2021-07-22: 150 mg via INTRAMUSCULAR

## 2021-07-22 MED ORDER — MEDROXYPROGESTERONE ACETATE 150 MG/ML IM SUSP: 150.0000 mg | Freq: Once | INTRAMUSCULAR | Status: DC

## 2021-07-22 NOTE — Progress Notes (Signed)
Dorothy Haynes here for Depo-Provera Injection. Injection administered without complication. Patient will return in 3 months for next injection between Feb 6 and Feb 20th. Next annual visit due 05/2022.  Ralene Bathe, RN 07/22/2021  9:05 AM

## 2021-07-22 NOTE — Progress Notes (Signed)
Patient was assessed and managed by nursing staff during this encounter. I have reviewed the chart and agree with the documentation and plan. I have also made any necessary editorial changes.  Brand Males, CNM 07/22/2021 12:59 PM

## 2021-07-23 DIAGNOSIS — R197 Diarrhea, unspecified: Secondary | ICD-10-CM | POA: Diagnosis not present

## 2021-07-23 DIAGNOSIS — E785 Hyperlipidemia, unspecified: Secondary | ICD-10-CM | POA: Diagnosis not present

## 2021-07-23 DIAGNOSIS — Z0001 Encounter for general adult medical examination with abnormal findings: Secondary | ICD-10-CM | POA: Diagnosis not present

## 2021-07-23 DIAGNOSIS — R059 Cough, unspecified: Secondary | ICD-10-CM | POA: Diagnosis not present

## 2021-07-23 DIAGNOSIS — E162 Hypoglycemia, unspecified: Secondary | ICD-10-CM | POA: Diagnosis not present

## 2021-07-23 DIAGNOSIS — Z01419 Encounter for gynecological examination (general) (routine) without abnormal findings: Secondary | ICD-10-CM | POA: Diagnosis not present

## 2021-07-23 DIAGNOSIS — Z6841 Body Mass Index (BMI) 40.0 and over, adult: Secondary | ICD-10-CM | POA: Diagnosis not present

## 2021-07-23 DIAGNOSIS — D649 Anemia, unspecified: Secondary | ICD-10-CM | POA: Diagnosis not present

## 2021-07-23 DIAGNOSIS — E1165 Type 2 diabetes mellitus with hyperglycemia: Secondary | ICD-10-CM | POA: Diagnosis not present

## 2021-07-23 DIAGNOSIS — Z23 Encounter for immunization: Secondary | ICD-10-CM | POA: Diagnosis not present

## 2021-07-23 DIAGNOSIS — I10 Essential (primary) hypertension: Secondary | ICD-10-CM | POA: Diagnosis not present

## 2021-08-15 ENCOUNTER — Other Ambulatory Visit: Payer: Self-pay

## 2021-08-15 ENCOUNTER — Ambulatory Visit (INDEPENDENT_AMBULATORY_CARE_PROVIDER_SITE_OTHER): Payer: Medicaid Other | Admitting: Family Medicine

## 2021-08-15 ENCOUNTER — Encounter (INDEPENDENT_AMBULATORY_CARE_PROVIDER_SITE_OTHER): Payer: Self-pay | Admitting: Family Medicine

## 2021-08-15 VITALS — BP 144/101 | HR 104 | Temp 98.3°F | Ht 63.0 in | Wt 275.0 lb

## 2021-08-15 DIAGNOSIS — R5383 Other fatigue: Secondary | ICD-10-CM | POA: Diagnosis not present

## 2021-08-15 DIAGNOSIS — Z3042 Encounter for surveillance of injectable contraceptive: Secondary | ICD-10-CM | POA: Diagnosis not present

## 2021-08-15 DIAGNOSIS — E1169 Type 2 diabetes mellitus with other specified complication: Secondary | ICD-10-CM | POA: Diagnosis not present

## 2021-08-15 DIAGNOSIS — E65 Localized adiposity: Secondary | ICD-10-CM | POA: Diagnosis not present

## 2021-08-15 DIAGNOSIS — K5790 Diverticulosis of intestine, part unspecified, without perforation or abscess without bleeding: Secondary | ICD-10-CM

## 2021-08-15 DIAGNOSIS — R252 Cramp and spasm: Secondary | ICD-10-CM

## 2021-08-15 DIAGNOSIS — H3581 Retinal edema: Secondary | ICD-10-CM

## 2021-08-15 DIAGNOSIS — E1159 Type 2 diabetes mellitus with other circulatory complications: Secondary | ICD-10-CM | POA: Diagnosis not present

## 2021-08-15 DIAGNOSIS — F419 Anxiety disorder, unspecified: Secondary | ICD-10-CM

## 2021-08-15 DIAGNOSIS — R0602 Shortness of breath: Secondary | ICD-10-CM

## 2021-08-15 DIAGNOSIS — H25813 Combined forms of age-related cataract, bilateral: Secondary | ICD-10-CM

## 2021-08-15 DIAGNOSIS — D649 Anemia, unspecified: Secondary | ICD-10-CM | POA: Diagnosis not present

## 2021-08-15 DIAGNOSIS — E785 Hyperlipidemia, unspecified: Secondary | ICD-10-CM

## 2021-08-15 DIAGNOSIS — G4733 Obstructive sleep apnea (adult) (pediatric): Secondary | ICD-10-CM

## 2021-08-15 DIAGNOSIS — G43909 Migraine, unspecified, not intractable, without status migrainosus: Secondary | ICD-10-CM | POA: Diagnosis not present

## 2021-08-15 DIAGNOSIS — I152 Hypertension secondary to endocrine disorders: Secondary | ICD-10-CM

## 2021-08-15 DIAGNOSIS — Z9049 Acquired absence of other specified parts of digestive tract: Secondary | ICD-10-CM

## 2021-08-15 DIAGNOSIS — E538 Deficiency of other specified B group vitamins: Secondary | ICD-10-CM | POA: Diagnosis not present

## 2021-08-15 DIAGNOSIS — E559 Vitamin D deficiency, unspecified: Secondary | ICD-10-CM | POA: Diagnosis not present

## 2021-08-15 DIAGNOSIS — H35033 Hypertensive retinopathy, bilateral: Secondary | ICD-10-CM

## 2021-08-15 DIAGNOSIS — Z1331 Encounter for screening for depression: Secondary | ICD-10-CM

## 2021-08-15 DIAGNOSIS — Z6841 Body Mass Index (BMI) 40.0 and over, adult: Secondary | ICD-10-CM

## 2021-08-16 ENCOUNTER — Encounter: Payer: Self-pay | Admitting: Family Medicine

## 2021-08-16 ENCOUNTER — Encounter (INDEPENDENT_AMBULATORY_CARE_PROVIDER_SITE_OTHER): Payer: Self-pay | Admitting: Family Medicine

## 2021-08-16 DIAGNOSIS — D72829 Elevated white blood cell count, unspecified: Secondary | ICD-10-CM | POA: Insufficient documentation

## 2021-08-16 DIAGNOSIS — H25813 Combined forms of age-related cataract, bilateral: Secondary | ICD-10-CM | POA: Insufficient documentation

## 2021-08-16 DIAGNOSIS — D75839 Thrombocytosis, unspecified: Secondary | ICD-10-CM | POA: Insufficient documentation

## 2021-08-16 DIAGNOSIS — H35033 Hypertensive retinopathy, bilateral: Secondary | ICD-10-CM | POA: Insufficient documentation

## 2021-08-16 DIAGNOSIS — H3581 Retinal edema: Secondary | ICD-10-CM | POA: Insufficient documentation

## 2021-08-16 LAB — CBC WITH DIFFERENTIAL/PLATELET
Basophils Absolute: 0.1 10*3/uL (ref 0.0–0.2)
Basos: 1 %
EOS (ABSOLUTE): 0.2 10*3/uL (ref 0.0–0.4)
Eos: 2 %
Hematocrit: 35 % (ref 34.0–46.6)
Hemoglobin: 11.9 g/dL (ref 11.1–15.9)
Immature Grans (Abs): 0.1 10*3/uL (ref 0.0–0.1)
Immature Granulocytes: 1 %
Lymphocytes Absolute: 4.6 10*3/uL — ABNORMAL HIGH (ref 0.7–3.1)
Lymphs: 36 %
MCH: 27.7 pg (ref 26.6–33.0)
MCHC: 34 g/dL (ref 31.5–35.7)
MCV: 82 fL (ref 79–97)
Monocytes Absolute: 0.6 10*3/uL (ref 0.1–0.9)
Monocytes: 5 %
Neutrophils Absolute: 7.4 10*3/uL — ABNORMAL HIGH (ref 1.4–7.0)
Neutrophils: 55 %
Platelets: 508 10*3/uL — ABNORMAL HIGH (ref 150–450)
RBC: 4.29 x10E6/uL (ref 3.77–5.28)
RDW: 13.9 % (ref 11.7–15.4)
WBC: 13 10*3/uL — ABNORMAL HIGH (ref 3.4–10.8)

## 2021-08-16 LAB — COMPREHENSIVE METABOLIC PANEL
ALT: 12 IU/L (ref 0–32)
AST: 12 IU/L (ref 0–40)
Albumin/Globulin Ratio: 1.5 (ref 1.2–2.2)
Albumin: 4.4 g/dL (ref 3.8–4.8)
Alkaline Phosphatase: 75 IU/L (ref 44–121)
BUN/Creatinine Ratio: 18 (ref 9–23)
BUN: 12 mg/dL (ref 6–20)
Bilirubin Total: <0.2 mg/dL (ref 0.0–1.2)
CO2: 23 mmol/L (ref 20–29)
Calcium: 9.5 mg/dL (ref 8.7–10.2)
Chloride: 99 mmol/L (ref 96–106)
Creatinine, Ser: 0.65 mg/dL (ref 0.57–1.00)
Globulin, Total: 3 g/dL (ref 1.5–4.5)
Glucose: 123 mg/dL — ABNORMAL HIGH (ref 70–99)
Potassium: 4.6 mmol/L (ref 3.5–5.2)
Sodium: 138 mmol/L (ref 134–144)
Total Protein: 7.4 g/dL (ref 6.0–8.5)
eGFR: 116 mL/min/{1.73_m2} (ref >59)

## 2021-08-16 LAB — IRON,TIBC AND FERRITIN PANEL
Ferritin: 83 ng/mL (ref 15–150)
Iron Saturation: 16 % (ref 15–55)
Iron: 50 ug/dL (ref 27–159)
Total Iron Binding Capacity: 311 ug/dL (ref 250–450)
UIBC: 261 ug/dL (ref 131–425)

## 2021-08-16 LAB — LIPID PANEL
Chol/HDL Ratio: 2.5 ratio (ref 0.0–4.4)
Cholesterol, Total: 125 mg/dL (ref 100–199)
HDL: 50 mg/dL (ref >39)
LDL Chol Calc (NIH): 51 mg/dL (ref 0–99)
Triglycerides: 139 mg/dL (ref 0–149)
VLDL Cholesterol Cal: 24 mg/dL (ref 5–40)

## 2021-08-16 LAB — T4, FREE: Free T4: 1.2 ng/dL (ref 0.82–1.77)

## 2021-08-16 LAB — TSH: TSH: 1.54 u[IU]/mL (ref 0.450–4.500)

## 2021-08-16 LAB — HEMOGLOBIN A1C
Est. average glucose Bld gHb Est-mCnc: 163 mg/dL
Hgb A1c MFr Bld: 7.3 % — ABNORMAL HIGH (ref 4.8–5.6)

## 2021-08-16 LAB — VITAMIN B12: Vitamin B-12: 347 pg/mL (ref 232–1245)

## 2021-08-16 LAB — VITAMIN D 25 HYDROXY (VIT D DEFICIENCY, FRACTURES): Vit D, 25-Hydroxy: 25.1 ng/mL — ABNORMAL LOW (ref 30.0–100.0)

## 2021-08-29 ENCOUNTER — Other Ambulatory Visit: Payer: Self-pay | Admitting: Family Medicine

## 2021-08-29 DIAGNOSIS — E119 Type 2 diabetes mellitus without complications: Secondary | ICD-10-CM

## 2021-08-29 DIAGNOSIS — I10 Essential (primary) hypertension: Secondary | ICD-10-CM

## 2021-08-29 NOTE — Progress Notes (Signed)
Chief Complaint:   OBESITY Dorothy Haynes (MR# [id number]) is a 35 y.o. female who presents for evaluation and treatment of obesity and related comorbidities. Current BMI is Body mass index is 48.71 kg/m. Dorothy Haynes has been struggling with her weight for many years and has been unsuccessful in either losing weight, maintaining weight loss, or reaching her healthy weight goal.  Dorothy Haynes is currently in the action stage of change and ready to dedicate time achieving and maintaining a healthier weight. Dorothy Haynes is interested in becoming our patient and working on intensive lifestyle modifications including (but not limited to) diet and exercise for weight loss.  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  Dorothy Haynes is single and lives with her mom and son (51).  Dorothy Haynes drinks sweetened beverages.  Dorothy Haynes says Dorothy Haynes eats large portions.  In [id number], Dorothy Haynes tried Trulicity, Victoza, metformin, and glimepiride.  Dorothy Haynes's habits were reviewed today and are as follows: her desired weight loss is 78 pounds, Dorothy Haynes has been heavy most of her life, her heaviest weight ever was her current weight, Dorothy Haynes is a picky eater and doesn't like to eat healthier foods, Dorothy Haynes skips meals frequently, Dorothy Haynes is frequently drinking liquids with calories, and Dorothy Haynes frequently eats larger portions than normal.  Dorothy Haynes provided the following food recall today:  Breakfast:  Chocolate covered pretzels. Snack:  Coffee with creamer. Lunch:  Taco salad - [employer]. Dinner:  Chicken pasta or bacon ranch wrap - [employer]. Drinks water.  Depression Screen Dorothy Haynes's Food and Mood (modified PHQ-9) score was 6.  Depression screen [facility] 2/9 08/15/2021  Decreased Interest 0  Down, Depressed, Hopeless 0  PHQ - 2 Score 0  Altered sleeping 2  Tired, decreased energy 0  Change in appetite 0  Feeling bad or failure about yourself  2  Trouble concentrating 2  Moving slowly or fidgety/restless 0  Suicidal thoughts 0  PHQ-9 Score 6  Difficult doing  work/chores Not difficult at all  Some recent data might be hidden   Assessment/Plan:   1. Other fatigue Dorothy Haynes denies daytime somnolence and denies waking up still tired. Patent has a history of symptoms of morning headache and snoring. Dorothy Haynes generally gets 6 hours of sleep per night, and states that Dorothy Haynes has generally restful sleep. Snoring is present. Apneic episodes are present. Epworth Sleepiness Score is 6.  Dorothy Haynes does not feel that her weight is causing her energy to be lower than it should be. Fatigue may be related to obesity, depression or many other causes. Labs will be ordered, and in the meanwhile, Dorothy Haynes will focus on self care including making healthy food choices, increasing physical activity and focusing on stress reduction.  - EKG 12-Lead - TSH - T4, free  2. SOB (shortness of breath) on exertion Dorothy Haynes notes increasing shortness of breath with exercising and seems to be worsening over time with weight gain. Dorothy Haynes notes getting out of breath sooner with activity than Dorothy Haynes used to. This has gotten worse recently. Dorothy Haynes denies shortness of breath at rest or orthopnea.  Dorothy Haynes does feel that Dorothy Haynes gets out of breath more easily that Dorothy Haynes used to when Dorothy Haynes exercises. Dorothy Haynes's shortness of breath appears to be obesity related and exercise induced. Dorothy Haynes has agreed to work on weight loss and gradually increase exercise to treat her exercise induced shortness of breath. Will continue to monitor closely.  3. Type 2 diabetes mellitus with other specified complication, without long-term current use of insulin (Dorothy Haynes) Diabetes Mellitus: Not at goal. Medication:  metformin XR 1000 mg twice daily, glimepiride 2 mg daily. Issues reviewed: blood sugar goals, complications of diabetes mellitus, hypoglycemia prevention and treatment, exercise, and nutrition.  Plan: Continue metformin and glimepiride. The patient will continue to focus on protein-rich, low simple carbohydrate foods. We reviewed  the importance of hydration, regular exercise for stress reduction, and restorative sleep. Will check labs today.  Lab Results  Component Value Date   HGBA1C 7.9 (A) 01/15/2021   HGBA1C 6.5 (A) 10/09/2020   - Comprehensive metabolic panel - Hemoglobin A1c  4. Hypertension associated with diabetes (HCC) Elevated. Medications: losartan 25 mg at bedtime.   Plan:  Continue losartan 25 mg at bedtime.  Avoid buying foods that are: processed, frozen, or prepackaged to avoid excess salt. We will watch for signs of hypotension as Dorothy Haynes continues lifestyle modifications.  BP Readings from Last 3 Encounters:  08/15/21 (!) 144/101  07/22/21 (!) 143/99  04/29/21 (!) 162/111   5. Hyperlipidemia associated with type 2 diabetes mellitus (HCC) Lipid-lowering medications: simvastatin 20 mg at bedtime.   Plan: Continue simvastatin.  Will check lipid panel today.  Dietary changes: Increase soluble fiber, decrease simple carbohydrates, decrease saturated fat. Exercise changes: Moderate to vigorous-intensity aerobic activity 150 minutes per week or as tolerated. We will continue to monitor along with PCP/specialists as it pertains to her weight loss journey.  - Lipid panel  6. Depo-Provera contraceptive status Dorothy Haynes endorses bad periods with excess bleeding.  Dorothy Haynes has tried an IUD.  Per Dr. Alysia Penna (OBGYN), Dorothy Haynes has no other options.  Dorothy Haynes has no desire for pregnancy - "hell no".  7. OSA (obstructive sleep apnea) Epworth Sleepiness Score is 6.  Dorothy Haynes says it is hard to use her machine.  Dorothy Haynes is not using it.  OSA is a cause of systemic hypertension and is associated with an increased incidence of stroke, heart failure, atrial fibrillation, and coronary heart disease. Severe OSA increases all-cause mortality and cardiovascular mortality.   Goal: Treatment of OSA via CPAP compliance and weight loss. Plasma ghrelin levels (appetite or "hunger hormone") are significantly higher in OSA patients than in BMI-matched  controls, but decrease to levels similar to those of obese patients without OSA after CPAP treatment.  Weight loss improves OSA by several mechanisms, including reduction in fatty tissue in the throat (i.e. parapharyngeal fat) and the tongue. Loss of abdominal fat increases mediastinal traction on the upper airway making it less likely to collapse during sleep. Studies have also shown that compliance with CPAP treatment improves leptin (hunger inhibitory hormone) imbalance.  8. Visceral obesity Current visceral fat rating: 17. Visceral fat rating goal is < 13. Visceral adipose tissue is a hormonally active component of total body fat. This body composition phenotype is associated with medical disorders such as metabolic syndrome, cardiovascular disease, and several malignancies including prostate, breast, and colorectal cancers. Starting goal: Lose 7-10% of starting weight.   9. Anemia, unspecified type History of normocytic anemia.  Plan:  Will check CBC and iron panel today.  CBC Latest Ref Rng & Units 06/02/2019 04/23/2018  WBC 3.4 - 10.8 x10E3/uL 12.9(H) 15.2(H)  Hemoglobin 11.1 - 15.9 g/dL 10.8(L) 11.6(L)  Hematocrit 34.0 - 46.6 % 33.4(L) 34.8(L)  Platelets 150 - 450 x10E3/uL 487(H) 454(H)   - CBC with Differential/Platelet - Iron, TIBC and Ferritin Panel  10. Migraine without status migrainosus, not intractable, unspecified migraine type We will continue to monitor as it relates to her weight loss journey.  11. Diverticulosis No active issue.  12. Leg cramps  Previously prescribed Flexeril.  Dorothy Haynes says it was not helpful.  13. Vitamin D deficiency Dorothy Haynes is taking calcium + vitamin D 500-200 mg daily.  Plan: Continue current OTC vitamin D supplementation. Will check vitamin D level today, as per below.  Lab Results  Component Value Date   VD25OH 25.1 (L) 08/15/2021   - VITAMIN D 25 Hydroxy (Vit-D Deficiency, Fractures)  14. B12 deficiency Lab Results  Component Value Date    VITAMINB12 347 08/15/2021   Supplementation: None.   Plan:  Will check vitamin B12 level today.  - Vitamin B12  15. History of cholecystectomy Performed on 10/09/2015.  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 Coralyn Pear at [facility].  17. Hypertensive retinopathy of both eyes Monitor blood pressure regularly.    18. Combined forms of age-related cataract of both eyes We will continue to monitor as it relates to her weight loss journey.  55. Depression screening Dorothy Haynes was screened for depression as part of her new patient workup today.  PHQ-9 is 6.  Dorothy Haynes had a positive depression screening. Depression is commonly associated with obesity and often results in emotional eating behaviors. We will monitor this closely and work on CBT to help improve the non-hunger eating patterns. Referral to Psychology may be required if no improvement is seen as Dorothy Haynes continues in our clinic.  20. Anxiety Behavior modification techniques were discussed today to help Dorothy Haynes deal with her anxiety.  Orders and follow up as documented in patient record.   21. Class 3 severe obesity with serious comorbidity and body mass index (BMI) of 45.0 to 49.9 in adult, unspecified obesity type (HCC)   Dorothy Haynes is currently in the action stage of change and her goal is to continue with weight loss efforts. I recommend Dorothy Haynes begin the structured treatment plan as follows:  Dorothy Haynes has agreed to the Category 3 Plan.  Exercise goals: No exercise has been prescribed at this time.   Behavioral modification strategies: increasing lean protein intake, decreasing simple carbohydrates, increasing vegetables, increasing water intake, decreasing liquid calories, decreasing sodium intake, and increasing high fiber foods.  Dorothy Haynes was informed of the importance of frequent follow-up visits to maximize her success with intensive lifestyle modifications for her multiple health conditions. Dorothy Haynes was informed we  would discuss her lab results at her next visit unless there is a critical issue that needs to be addressed sooner. Dorothy Haynes agreed to keep her next visit at the agreed upon time to discuss these results.  Objective:   Blood pressure (!) 144/101, pulse (!) 104, temperature 98.3 F (36.8 C), temperature source Oral, height 5\' 3"  (1.6 m), weight 275 lb (124.7 kg), SpO2 98 %. Body mass index is 48.71 kg/m.  EKG: Normal sinus rhythm, rate 94 bpm.  Indirect Calorimeter completed today shows a VO2 of 290 and a REE of 2002.  Her calculated basal metabolic rate is 0000000 thus her basal metabolic rate is better than expected.  General: Cooperative, alert, well developed, in no acute distress. HEENT: Conjunctivae and lids unremarkable. Cardiovascular: Regular rhythm.  Lungs: Normal work of breathing. Neurologic: No focal deficits.   Lab Results  Component Value Date   CREATININE 0.65 08/15/2021   BUN 12 08/15/2021   NA 138 08/15/2021   K 4.6 08/15/2021   CL 99 08/15/2021   CO2 23 08/15/2021   Lab Results  Component Value Date   ALT 12 08/15/2021   AST 12 08/15/2021   ALKPHOS 75 08/15/2021   BILITOT <  0.2 08/15/2021   Lab Results  Component Value Date   HGBA1C 7.3 (H) 08/15/2021   HGBA1C 7.9 (A) 01/15/2021   HGBA1C 6.5 (A) 10/09/2020   HGBA1C 6.1 (A) 07/10/2020   HGBA1C 6.9 04/04/2020   Lab Results  Component Value Date   TSH 1.540 08/15/2021   Lab Results  Component Value Date   CHOL 125 08/15/2021   HDL 50 08/15/2021   LDLCALC 51 08/15/2021   TRIG 139 08/15/2021   CHOLHDL 2.5 08/15/2021   Lab Results  Component Value Date   VD25OH 25.1 (L) 08/15/2021   Lab Results  Component Value Date   WBC 13.0 (H) 08/15/2021   HGB 11.9 08/15/2021   HCT 35.0 08/15/2021   MCV 82 08/15/2021   PLT 508 (H) 08/15/2021   Lab Results  Component Value Date   IRON 50 08/15/2021   TIBC 311 08/15/2021   FERRITIN 83 08/15/2021   Attestation Statements:   This is the patient's first  visit at [facility]. The patient's NEW PATIENT PACKET was reviewed at length. Included in the packet: current and past health history, medications, allergies, ROS, gynecologic history (women only), surgical history, family history, social history, weight history, weight loss surgery history (for those that have had weight loss surgery), nutritional evaluation, mood and food questionnaire, PHQ9, Epworth questionnaire, sleep habits questionnaire, patient life and health improvement goals questionnaire. These will all be scanned into the patient's chart under media.   During the visit, I independently reviewed the patient's EKG, bioimpedance scale results, and indirect calorimeter results. I used this information to tailor a meal plan for the patient that will help her to lose weight and will improve her obesity-related conditions going forward. I performed a medically necessary appropriate examination and/or evaluation. I discussed the assessment and treatment plan with the patient. The patient was provided an opportunity to ask questions and all were answered. The patient agreed with the plan and demonstrated an understanding of the instructions. Labs were ordered at this visit and will be reviewed at the next visit unless more critical results need to be addressed immediately. Clinical information was updated and documented in the EMR.   I, Water quality scientist, CMA, am acting as transcriptionist for Briscoe Deutscher, DO  I have reviewed the above documentation for accuracy and completeness, and I agree with the above. - Briscoe Deutscher, DO, MS, FAAFP, DABOM - Family and Bariatric Medicine.

## 2021-09-05 ENCOUNTER — Telehealth: Payer: Self-pay | Admitting: Hematology and Oncology

## 2021-09-05 ENCOUNTER — Ambulatory Visit (INDEPENDENT_AMBULATORY_CARE_PROVIDER_SITE_OTHER): Payer: BC Managed Care – PPO | Admitting: Family Medicine

## 2021-09-05 ENCOUNTER — Other Ambulatory Visit: Payer: Self-pay

## 2021-09-05 ENCOUNTER — Encounter (INDEPENDENT_AMBULATORY_CARE_PROVIDER_SITE_OTHER): Payer: Self-pay | Admitting: Family Medicine

## 2021-09-05 VITALS — BP 129/88 | HR 87 | Temp 97.9°F | Ht 63.0 in | Wt 275.0 lb

## 2021-09-05 DIAGNOSIS — Z6841 Body Mass Index (BMI) 40.0 and over, adult: Secondary | ICD-10-CM | POA: Diagnosis not present

## 2021-09-05 DIAGNOSIS — E559 Vitamin D deficiency, unspecified: Secondary | ICD-10-CM

## 2021-09-05 DIAGNOSIS — D72828 Other elevated white blood cell count: Secondary | ICD-10-CM

## 2021-09-05 DIAGNOSIS — E1169 Type 2 diabetes mellitus with other specified complication: Secondary | ICD-10-CM

## 2021-09-05 MED ORDER — VITAMIN D (ERGOCALCIFEROL) 1.25 MG (50000 UNIT) PO CAPS: 50000.0000 [IU] | ORAL_CAPSULE | ORAL | 0 refills | Status: DC

## 2021-09-05 MED ORDER — TIRZEPATIDE 2.5 MG/0.5ML ~~LOC~~ SOAJ: 2.5000 mg | SUBCUTANEOUS | 0 refills | Status: DC

## 2021-09-05 NOTE — Progress Notes (Signed)
Chief Complaint:   OBESITY Dorothy Haynes is here to discuss her progress with her obesity treatment plan along with follow-up of her obesity related diagnoses. See Medical Weight Management Flowsheet for complete bioelectrical impedance results.  Today's visit was #: 2 Starting weight: 275 lbs Starting date: 10/16/2020 Weight change since last visit: 0 Total lbs lost to date: 0  Nutrition Plan: Category 3 Plan for 80% of the time.  Activity: None.  Interim History: Dorothy Haynes endorses polyphagia and cravings.  Assessment/Plan:   1. 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 No history of work-up per patient.  Plan:  Referral to Hematology/Oncology placed today.  - Ambulatory referral to Hematology / Oncology  2. Type 2 diabetes mellitus with other specified complication, without long-term current use of insulin (HCC) Diabetes Mellitus: Not at goal. Medication: Amaryl 2 mg daily, metformin XR 1000 mg twice daily. Issues reviewed: blood sugar goals, complications of diabetes mellitus, hypoglycemia prevention and treatment, exercise, and nutrition.  Plan: Stop Amaryl and continue metformin.  Start Mounjaro 2.5 mg .sucu weekly. The patient will continue to focus on protein-rich, low simple carbohydrate foods. We reviewed the importance of hydration, regular exercise for stress reduction, and restorative sleep.   Lab Results  Component Value Date   HGBA1C 7.3 (H) 08/15/2021   HGBA1C 7.9 (A) 01/15/2021   HGBA1C 6.5 (A) 10/09/2020   Lab Results  Component Value Date   LDLCALC 51 08/15/2021   CREATININE 0.65 08/15/2021   - Start tirzepatide [facility]) 2.5 MG/0.5ML Pen; Inject 2.5 mg into the skin once a week.  Dispense: 2 mL; Refill: 0  3. Vitamin D deficiency Not at goal.   Plan: Start to take prescription Vitamin D @50 ,000 IU every week as prescribed.  Follow-up for routine testing of Vitamin D, at least 2-3 times per year to avoid over-replacement.  Lab Results   Component Value Date   VD25OH 25.1 (L) 08/15/2021   - Start Vitamin D, Ergocalciferol, (DRISDOL) 1.25 MG (50000 UNIT) CAPS capsule; Take 1 capsule (50,000 Units total) by mouth every 7 (seven) days.  Dispense: 4 capsule; Refill: 0  4. Obesity, current BMI 48.8  Course: Dorothy Haynes is currently in the action stage of change. As such, her goal is to continue with weight loss efforts.   Nutrition goals: She has agreed to the Category 3 Plan.   Exercise goals:  Increase NEAT.  Behavioral modification strategies: increasing lean protein intake, decreasing simple carbohydrates, increasing vegetables, increasing water intake, and decreasing liquid calories.  Dorothy Haynes has agreed to follow-up with our clinic in 3-4 weeks. She was informed of the importance of frequent follow-up visits to maximize her success with intensive lifestyle modifications for her multiple health conditions.   Objective:   Blood pressure 129/88, pulse 87, temperature 97.9 F (36.6 C), temperature source Oral, height 5\' 3"  (1.6 m), weight 275 lb (124.7 kg), SpO2 98 %. Body mass index is 48.71 kg/m.  General: Cooperative, alert, well developed, in no acute distress. HEENT: Conjunctivae and lids unremarkable. Cardiovascular: Regular rhythm.  Lungs: Normal work of breathing. Neurologic: No focal deficits.   Lab Results  Component Value Date   CREATININE 0.65 08/15/2021   BUN 12 08/15/2021   NA 138 08/15/2021   K 4.6 08/15/2021   CL 99 08/15/2021   CO2 23 08/15/2021   Lab Results  Component Value Date   ALT 12 08/15/2021   AST 12 08/15/2021   ALKPHOS 75 08/15/2021   BILITOT <0.2 08/15/2021   Lab Results  Component Value Date   HGBA1C 7.3 (H) 08/15/2021   HGBA1C 7.9 (A) 01/15/2021   HGBA1C 6.5 (A) 10/09/2020   HGBA1C 6.1 (A) 07/10/2020   HGBA1C 6.9 04/04/2020   Lab Results  Component Value Date   TSH 1.540 08/15/2021   Lab Results  Component Value Date   CHOL 125 08/15/2021   HDL 50 08/15/2021    LDLCALC 51 08/15/2021   TRIG 139 08/15/2021   CHOLHDL 2.5 08/15/2021   Lab Results  Component Value Date   VD25OH 25.1 (L) 08/15/2021   Lab Results  Component Value Date   WBC 13.0 (H) 08/15/2021   HGB 11.9 08/15/2021   HCT 35.0 08/15/2021   MCV 82 08/15/2021   PLT 508 (H) 08/15/2021   Lab Results  Component Value Date   IRON 50 08/15/2021   TIBC 311 08/15/2021   FERRITIN 83 08/15/2021   Attestation Statements:   Reviewed by clinician on day of visit: allergies, medications, problem list, medical history, surgical history, family history, social history, and previous encounter notes.  I, Insurance claims handler, CMA, am acting as transcriptionist for Helane Rima, DO  I have reviewed the above documentation for accuracy and completeness, and I agree with the above. -  Helane Rima, DO, MS, FAAFP, DABOM - Family and Bariatric Medicine.

## 2021-09-05 NOTE — Telephone Encounter (Signed)
Scheduled appt per 1/5 referral. Pt is aware of appt date and time. Pt is aware to arrive 15 mins prior to appt time.  °

## 2021-09-10 ENCOUNTER — Telehealth (INDEPENDENT_AMBULATORY_CARE_PROVIDER_SITE_OTHER): Payer: Self-pay

## 2021-09-10 DIAGNOSIS — G4733 Obstructive sleep apnea (adult) (pediatric): Secondary | ICD-10-CM | POA: Diagnosis not present

## 2021-09-10 NOTE — Telephone Encounter (Signed)
PA started

## 2021-09-10 NOTE — Telephone Encounter (Signed)
Pt called in and stated that she is waiting for insurance to approve her Mounjaro. The pt was calling to see what we can do. I told her I will send a message. Please advise

## 2021-09-11 ENCOUNTER — Encounter (INDEPENDENT_AMBULATORY_CARE_PROVIDER_SITE_OTHER): Payer: Self-pay

## 2021-09-11 NOTE — Telephone Encounter (Signed)
PA approved through  09/10/2022

## 2021-09-18 ENCOUNTER — Telehealth: Payer: Self-pay | Admitting: Hematology and Oncology

## 2021-09-18 NOTE — Telephone Encounter (Signed)
R/s pt's new hem appt per pt request. She is aware of new appt date and time.  

## 2021-09-19 ENCOUNTER — Other Ambulatory Visit: Payer: Self-pay | Admitting: Family Medicine

## 2021-09-19 ENCOUNTER — Inpatient Hospital Stay: Payer: Medicaid Other | Admitting: Hematology and Oncology

## 2021-09-19 ENCOUNTER — Inpatient Hospital Stay: Payer: Medicaid Other

## 2021-09-19 DIAGNOSIS — E119 Type 2 diabetes mellitus without complications: Secondary | ICD-10-CM

## 2021-10-05 ENCOUNTER — Other Ambulatory Visit (INDEPENDENT_AMBULATORY_CARE_PROVIDER_SITE_OTHER): Payer: Self-pay | Admitting: Family Medicine

## 2021-10-05 DIAGNOSIS — E559 Vitamin D deficiency, unspecified: Secondary | ICD-10-CM

## 2021-10-05 DIAGNOSIS — E1169 Type 2 diabetes mellitus with other specified complication: Secondary | ICD-10-CM

## 2021-10-08 ENCOUNTER — Other Ambulatory Visit: Payer: Self-pay

## 2021-10-08 ENCOUNTER — Ambulatory Visit (INDEPENDENT_AMBULATORY_CARE_PROVIDER_SITE_OTHER): Payer: BC Managed Care – PPO | Admitting: General Practice

## 2021-10-08 VITALS — BP 136/89 | HR 92 | Ht 63.0 in | Wt 278.0 lb

## 2021-10-08 DIAGNOSIS — Z3042 Encounter for surveillance of injectable contraceptive: Secondary | ICD-10-CM | POA: Diagnosis not present

## 2021-10-08 MED ORDER — MEDROXYPROGESTERONE ACETATE 150 MG/ML IM SUSP
150.0000 mg | Freq: Once | INTRAMUSCULAR | Status: AC
  Administered 2021-10-08: 150 mg via INTRAMUSCULAR

## 2021-10-08 NOTE — Progress Notes (Signed)
Dorothy Haynes here for Depo-Provera Injection. Injection administered without complication. Patient will return in 3 months for next injection between April 25 and May 9. Next annual visit due August 2023.   Derinda Late, RN 10/08/2021  8:30 AM

## 2021-10-10 NOTE — Progress Notes (Signed)
[facility] Note  10/15/2021     CHIEF COMPLAINT Patient presents for Retina Follow Up   HISTORY OF PRESENT ILLNESS: Dorothy Haynes is a 36 y.o. female who presents to the clinic today for:   HPI     Retina Follow Up   Patient presents with  Diabetic Retinopathy.  In both eyes.  Severity is moderate.  Duration of 12 months.  I, the attending physician,  performed the HPI with the patient and updated documentation appropriately.        Comments   Patient states vision the same OU. Last a1c was 7.1, checked at end of 2022.      Last edited by Bernarda Caffey, MD on 10/16/2021 10:05 PM.     Pt states she is on a weight loss program, [employer], has lost 3 lbs. Vision is stable.   Referring physician: Audley Hose, MD [place],  [place] [address]  HISTORICAL INFORMATION:   Selected notes from the MEDICAL RECORD NUMBER Referred by Dr. Orson Eva for DM exam LEE:  Ocular Hx- PMH-DM (A1C: 7.9, metformin), HTN, smoker    CURRENT MEDICATIONS: No current outpatient medications on file. (Ophthalmic Drugs)   No current facility-administered medications for this visit. (Ophthalmic Drugs)   Current Outpatient Medications (Other)  Medication Sig   ACCU-CHEK GUIDE test strip See admin instructions.   Accu-Chek Softclix Lancets lancets every morning.   Blood Glucose Monitoring Suppl (ACCU-CHEK GUIDE ME) w/Device KIT daily. as directed   calcium-vitamin D (OSCAL WITH D) 500-200 MG-UNIT tablet Take 1 tablet by mouth 2 (two) times daily.   cetirizine (ZYRTEC) 10 MG tablet Take 1 tablet by mouth once daily   Insulin Pen Needle 32G X 6 MM MISC 1 each by Does not apply route daily.   losartan (COZAAR) 25 MG tablet TAKE 1 TABLET BY MOUTH AT BEDTIME   simvastatin (ZOCOR) 20 MG tablet TAKE 1 TABLET BY MOUTH AT BEDTIME   VENTOLIN HFA 108 (90 Base) MCG/ACT inhaler Inhale into the lungs.   Vitamin D, Ergocalciferol,  (DRISDOL) 1.25 MG (50000 UNIT) CAPS capsule Take 1 capsule by mouth once a week   cyclobenzaprine (FLEXERIL) 5 MG tablet Take 5 mg by mouth at bedtime as needed. (Patient not taking: Reported on 10/15/2021)   glimepiride (AMARYL) 2 MG tablet Take 2 mg by mouth daily. (Patient not taking: Reported on 10/15/2021)   rosuvastatin (CRESTOR) 10 MG tablet Take 10 mg by mouth at bedtime. (Patient not taking: Reported on 10/15/2021)   SUMAtriptan (IMITREX) 100 MG tablet Take 100 mg by mouth as directed. (Patient not taking: Reported on 10/15/2021)   tirzepatide [facility]) 10 MG/0.5ML Pen Inject 10 mg into the skin once a week. (Patient not taking: Reported on 10/15/2021)   tirzepatide [facility]) 5 MG/0.5ML Pen Inject 5 mg into the skin once a week. (Patient not taking: Reported on 10/15/2021)   tirzepatide [facility]) 7.5 MG/0.5ML Pen Inject 7.5 mg into the skin once a week. (Patient not taking: Reported on 10/15/2021)   No current facility-administered medications for this visit. (Other)   REVIEW OF SYSTEMS: ROS   Positive for: Endocrine, Cardiovascular, Eyes Negative for: Constitutional, Gastrointestinal, Neurological, Skin, Genitourinary, Musculoskeletal, HENT, Respiratory, Psychiatric, Allergic/Imm, Heme/Lymph Last edited by Roselee Nova D, COT on 10/15/2021  2:56 PM.     ALLERGIES No Known Allergies  PAST MEDICAL HISTORY Past Medical History:  Diagnosis Date   Anxiety    Back pain  Diabetes (Dorothy Haynes)    Diverticulitis 03/15/2018   Gallbladder problem    GERD (gastroesophageal reflux disease) 2009   Gestational diabetes 2008   Headache    History of gallstones 10/2015   s/p cholecystectomy 10/09/15   Hyperlipidemia    Hypertension    Joint pain    Menorrhagia with irregular cycle 10/23/2016   Morbid obesity (Dorothy Haynes) 10/09/2015   Physical deconditioning 06/22/2019   Plantar fasciitis, bilateral 06/02/2019   Shortness of breath dyspnea    Sleep apnea    TMJ (dislocation of temporomandibular  joint), initial encounter 06/02/2019   Tobacco abuse 10/09/2015   Past Surgical History:  Procedure Laterality Date   CHOLECYSTECTOMY N/A 10/09/2015   Procedure: LAPAROSCOPIC CHOLECYSTECTOMY ;  Surgeon: Fanny Skates, MD;  Location: [facility];  Service: General;  Laterality: N/A;   WISDOM TOOTH EXTRACTION      FAMILY HISTORY Family History  Problem Relation Age of Onset   Drug abuse Mother    Heart disease Mother    Diabetes Mother    Hypertension Mother    Cataracts Mother    Cancer Father    Throat cancer Father    Kidney Stones Father    Alcoholism Father    Drug abuse Father    Diabetes Maternal Grandmother    Diabetes Maternal Grandfather    Blindness Maternal Aunt    Alzheimer's disease Other    Diabetes Other    Hypertension Other    Heart disease Other    Heart disease Other    Diabetes Other    Hypertension Other    Thyroid disease Other    Amblyopia Neg Hx    Glaucoma Neg Hx    Macular degeneration Neg Hx    Retinal detachment Neg Hx    Strabismus Neg Hx    Retinitis pigmentosa Neg Hx    SOCIAL HISTORY Social History   Tobacco Use   Smoking status: Former    Packs/day: 0.20    Years: 17.00    Pack years: 3.40    Types: Cigarettes    Quit date: 03/01/2018    Years since quitting: 3.6   Smokeless tobacco: Never  Substance Use Topics   Alcohol use: Yes    Comment: occ, 1-2 drink per week   Drug use: No    Comment: previously used MJ, has been off recreational drugs       OPHTHALMIC EXAM:  Base Eye Exam     Visual Acuity (Snellen - Linear)       Right Left   Dist cc 20/20 20/20 -1    Correction: Glasses         Tonometry (Tonopen, 3:06 PM)       Right Left   Pressure 17 15         Pupils       Dark Light Shape React APD   Right 3 2 Round Brisk None   Left 3 2 Round Brisk None         Visual Fields (Counting fingers)       Left Right    Full Full         Extraocular Movement       Right Left    Full, Ortho Full,  Ortho         Neuro/Psych     Oriented x3: Yes   Mood/Affect: Normal         Dilation     Both eyes: 1.0% Mydriacyl, 2.5% Phenylephrine @ 3:06 PM  Slit Lamp and Fundus Exam     External Exam       Right Left   External Normal Normal         Slit Lamp Exam       Right Left   Lids/Lashes Normal Normal   Conjunctiva/Sclera mild Melanosis mild Melanosis   Cornea Clear Trace Punctate epithelial erosions   Anterior Chamber deep and clear deep and clear   Iris Round and dilated Round and dilated   Lens 1+ Nuclear sclerosis, 1-2+ Cortical cataract 1+ Nuclear sclerosis, 1-2+ Cortical cataract   Anterior Vitreous Mild Vitreous syneresis Normal         Fundus Exam       Right Left   Disc Pink and Sharp, no NVD Pink and Sharp   C/D Ratio 0.3 0.4   Macula Flat Good foveal reflex, mild Retinal pigment epithelial mottling, No heme or edema Flat, Good foveal reflex, mild Retinal pigment epithelial mottling, No heme or edema   Vessels mild attenuation, mild tortuousity, mild Copper Wiring mild attenuation, mild tortuousity, mild copper wiring   Periphery Attached, pigmented peripheral cystoid degenration greatest inferiorly, No heme Attached, pigmented peripheral cystoid degenration greatest inferiorly, No heme            IMAGING AND PROCEDURES  Imaging and Procedures for _0 @  OCT, Retina - OU - Both Eyes       Right Eye Quality was good. Central Foveal Thickness: 247. Progression has been stable. Findings include normal foveal contour, no IRF, no SRF, vitreomacular adhesion .   Left Eye Quality was good. Central Foveal Thickness: 246. Progression has been stable. Findings include normal foveal contour, no IRF, no SRF, vitreomacular adhesion .   Notes *Images captured and stored on drive  Diagnosis / Impression:  NFP; no IRF/SRF OU, VMA OU No DME OU  Clinical management:  See below  Abbreviations: NFP - Normal foveal profile. CME -  cystoid macular edema. PED - pigment epithelial detachment. IRF - intraretinal fluid. SRF - subretinal fluid. EZ - ellipsoid zone. ERM - epiretinal membrane. ORA - outer retinal atrophy. ORT - outer retinal tubulation. SRHM - subretinal hyper-reflective material\            ASSESSMENT/PLAN:   ICD-10-CM   1. Diabetes mellitus type 2 without retinopathy ([place])  E11.9 OCT, Retina - OU - Both Eyes    2. Essential hypertension  I10     3. Hypertensive retinopathy of both eyes  H35.033     4. Combined forms of age-related cataract of both eyes  H25.813      1. Diabetes mellitus, type 2 without retinopathy  - A1c 7.3 08/15/21  - The incidence, risk factors for progression, natural history and treatment options for diabetic retinopathy  were discussed with patient.    - The need for close monitoring of blood glucose, blood pressure, and serum lipids, avoiding cigarette or any type of tobacco, and the need for long term follow up was also discussed with patient.   - f/u in 1 year, sooner prn  2,3. Hypertensive retinopathy OU  - discussed importance of tight BP control  - monitor   4. Mild Mixed form cataract OU  - The symptoms of cataract, surgical options, and treatments and risks were discussed with patient.  - discussed diagnosis and progression  - very mild/early -- not yet visually significant  - continue monitoring  Ophthalmic Meds Ordered this visit:  No orders of the defined types were placed in this  encounter.    Return in about 1 year (around 10/15/2022) for Diabetic Exam, DFE, OCT.  There are no Patient Instructions on file for this visit.  This document serves as a record of services personally performed by Gardiner Sleeper, MD, PhD. It was created on their behalf by Leonie Douglas, an ophthalmic technician. The creation of this record is the provider's dictation and/or activities during the visit.    Electronically signed by: Leonie Douglas COA, 10/16/21  10:06 PM   Gardiner Sleeper, M.D., Ph.D. Diseases & Surgery of the Retina and Vitreous [facility]  I have reviewed the above documentation for accuracy and completeness, and I agree with the above. Gardiner Sleeper, M.D., Ph.D. 10/16/21 10:08 PM   Abbreviations: M myopia (nearsighted); A astigmatism; H hyperopia (farsighted); P presbyopia; Mrx spectacle prescription;  CTL contact lenses; OD right eye; OS left eye; OU both eyes  XT exotropia; ET esotropia; PEK punctate epithelial keratitis; PEE punctate epithelial erosions; DES dry eye syndrome; MGD meibomian gland dysfunction; ATs artificial tears; PFAT's preservative free artificial tears; [place] nuclear sclerotic cataract; PSC posterior subcapsular cataract; ERM epi-retinal membrane; PVD posterior vitreous detachment; RD retinal detachment; DM diabetes mellitus; DR diabetic retinopathy; NPDR non-proliferative diabetic retinopathy; PDR proliferative diabetic retinopathy; CSME clinically significant macular edema; DME diabetic macular edema; dbh dot blot hemorrhages; CWS cotton wool spot; POAG primary open angle glaucoma; C/D cup-to-disc ratio; HVF humphrey visual field; GVF goldmann visual field; OCT optical coherence tomography; IOP intraocular pressure; BRVO Branch retinal vein occlusion; CRVO central retinal vein occlusion; CRAO central retinal artery occlusion; BRAO branch retinal artery occlusion; RT retinal tear; SB scleral buckle; PPV pars plana vitrectomy; VH Vitreous hemorrhage; PRP panretinal laser photocoagulation; IVK intravitreal kenalog; VMT vitreomacular traction; MH Macular hole;  NVD neovascularization of the disc; NVE neovascularization elsewhere; AREDS age related eye disease study; ARMD age related macular degeneration; POAG primary open angle glaucoma; EBMD epithelial/anterior basement membrane dystrophy; ACIOL anterior chamber intraocular lens; IOL intraocular lens; PCIOL posterior chamber intraocular lens; Phaco/IOL  phacoemulsification with intraocular lens placement; [place] photorefractive keratectomy; LASIK laser assisted in situ keratomileusis; HTN hypertension; DM diabetes mellitus; COPD chronic obstructive pulmonary disease

## 2021-10-13 NOTE — Progress Notes (Signed)
[place] [phone]   [phone]  INITIAL CONSULT NOTE  Patient Care Team: Gladys Damme, MD as PCP - General  Hematological/Oncological History # Leukocytosis (Neutrophilic predominance)  # Thrombocytosis  08/10/2015: WBC 14.6, Hgb 13.3, Plt 463 04/23/2018: WBC 15.2, Hgb 11.6, MCV 86.6, Plt 454 08/15/2021: WBC 13.0, Hgb 11.9, MCV 82, Plt 508 10/14/2021: establish care with Dr. Lorenso Courier   CHIEF COMPLAINTS/PURPOSE OF CONSULTATION:  "Leukocytosis/Thrombocytosis "  HISTORY OF PRESENTING ILLNESS:  Dorothy Haynes 36 y.o. female with medical history significant for GERD, diabetes, diverticulitis, hyperlipidemia, hypertension, and obstructive sleep apnea who presents for evaluation of leukocytosis and thrombocytosis.  On review of the previous records Dorothy Haynes has a leukocytosis dating back to at least 02/25/2010.  At that time she was noted to have white blood cell count of 16.1.  She has almost always had a leukocytosis of neutrophilic predominance since that time on record.  Most recently on 08/16/2019 the patient had white blood cell count 13.0, hemoglobin 11.9, MCV 82, and platelets of 508.  Due to concern for these findings the patient was referred to hematology for further evaluation and management.  On exam today Dorothy Haynes reports that she recently became aware of her hematological abnormalities by her weight doctor.  She notes that she has never had her spleen removed.  She notes that she does have some occasional discomfort in her abdomen and her sides hurt on occasion if she eats "the wrong food".  She thinks this may be related to the diverticulitis that she had approximately 4 years ago.  She is also concerned that she may have fibroids given her strong family history and heavy menstrual cycles.  She reports that her menstrual cycles last for 10 days and were quite irregular.  She notes that on her heaviest days she will change her pad every  2 hours.  She is back on Depo shots for the last 2 years which has been controlling her menstrual cycles.  Also she has been taking iron pills p.o. without any difficulty.  On further discussion she notes that she is aware she has a history of OSA but does not always wear her machine faithfully.  Her weight loss doctor told her that the migraine she was experiencing may be secondary to OSA and she has been more compliant with the treatment.  She notes that she was previously a smoker but quit approximately 4 years ago.  She drinks "once in every blue moon".  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  She notes her family history is remarkable for throat cancer in her father, lung cancer in a paternal grandmother and breast cancer in her maternal grandfather.  Her mother has a history of diabetes.  She otherwise denies any fevers, chills, sweats, nausea, vomiting or diarrhea.  Full 10 point ROS is listed below.  MEDICAL HISTORY:  Past Medical History:  Diagnosis Date   Anxiety    Back pain    Diabetes ([place])    Diverticulitis 03/15/2018   Gallbladder problem    GERD (gastroesophageal reflux disease) 2009   Gestational diabetes 2008   Headache    History of gallstones 10/2015   s/p cholecystectomy 10/09/15   Hyperlipidemia    Hypertension    Joint pain    Menorrhagia with irregular cycle 10/23/2016   Morbid obesity ([place]) 10/09/2015   Physical deconditioning 06/22/2019   Plantar fasciitis, bilateral 06/02/2019   Shortness of breath dyspnea  Sleep apnea    TMJ (dislocation of temporomandibular joint), initial encounter 06/02/2019   Tobacco abuse 10/09/2015    SURGICAL HISTORY: Past Surgical History:  Procedure Laterality Date   CHOLECYSTECTOMY N/A 10/09/2015   Procedure: LAPAROSCOPIC CHOLECYSTECTOMY ;  Surgeon: Fanny Skates, MD;  Location: [facility];  Service: General;  Laterality: N/A;   WISDOM TOOTH EXTRACTION      SOCIAL HISTORY: Social History    Socioeconomic History   Marital status: Single    Spouse name: Not on file   Number of children: 1   Years of education: college graduate   Highest education level: Not on file  Occupational History    Employer: [employer]    Comment: part-time   Occupation: Biomedical scientist  Tobacco Use   Smoking status: Former    Packs/day: 0.20    Years: 17.00    Pack years: 3.40    Types: Cigarettes    Quit date: 03/01/2018    Years since quitting: 3.6   Smokeless tobacco: Never  Substance and Sexual Activity   Alcohol use: Yes    Comment: occ, 1-2 drink per week   Drug use: No    Comment: previously used MJ, has been off recreational drugs   Sexual activity: Yes    Partners: Male    Birth control/protection: Injection  Other Topics Concern   Not on file  Social History Narrative   Lives with mother and son   Social Determinants of Health   Financial Resource Strain: Not on file  Food Insecurity: Food Insecurity Present   Worried About Charity fundraiser in the Last Year: Sometimes true   [place] in the Last Year: Sometimes true  Transportation Needs: No Transportation Needs   Lack of Transportation (Medical): No   Lack of Transportation (Non-Medical): No  Physical Activity: Not on file  Stress: Not on file  Social Connections: Not on file  Intimate Partner Violence: Not on file    FAMILY HISTORY: Family History  Problem Relation Age of Onset   Drug abuse Mother    Heart disease Mother    Diabetes Mother    Hypertension Mother    Cataracts Mother    Cancer Father    Throat cancer Father    Kidney Stones Father    Alcoholism Father    Drug abuse Father    Diabetes Maternal Grandmother    Diabetes Maternal Grandfather    Blindness Maternal Aunt    Alzheimer's disease Other    Diabetes Other    Hypertension Other    Heart disease Other    Heart disease Other    Diabetes Other    Hypertension Other    Thyroid disease Other    Amblyopia  Neg Hx    Glaucoma Neg Hx    Macular degeneration Neg Hx    Retinal detachment Neg Hx    Strabismus Neg Hx    Retinitis pigmentosa Neg Hx     ALLERGIES:  has No Known Allergies.  MEDICATIONS:  Current Outpatient Medications  Medication Sig Dispense Refill   ACCU-CHEK GUIDE test strip See admin instructions.     Accu-Chek Softclix Lancets lancets every morning.     Blood Glucose Monitoring Suppl (ACCU-CHEK GUIDE ME) w/Device KIT daily. as directed     calcium-vitamin D (OSCAL WITH D) 500-200 MG-UNIT tablet Take 1 tablet by mouth 2 (two) times daily. 60 tablet 11   cetirizine (ZYRTEC) 10 MG tablet Take 1 tablet by mouth  once daily 30 tablet 11   cyclobenzaprine (FLEXERIL) 5 MG tablet Take 5 mg by mouth at bedtime as needed.     glimepiride (AMARYL) 2 MG tablet Take 2 mg by mouth daily.     Insulin Pen Needle 32G X 6 MM MISC 1 each by Does not apply route daily. 100 each 6   losartan (COZAAR) 25 MG tablet TAKE 1 TABLET BY MOUTH AT BEDTIME 90 tablet 0   metFORMIN (GLUCOPHAGE-XR) 500 MG 24 hr tablet Take 2 tablets by mouth twice daily 120 tablet 1   MOUNJARO 2.5 MG/0.5ML Pen INJECT 2.5 MG SUBCUTANEOUSLY ONCE A WEEK 4 mL 0   rosuvastatin (CRESTOR) 10 MG tablet Take 10 mg by mouth at bedtime.     simvastatin (ZOCOR) 20 MG tablet TAKE 1 TABLET BY MOUTH AT BEDTIME 90 tablet 3   SUMAtriptan (IMITREX) 100 MG tablet Take 100 mg by mouth as directed.     VENTOLIN HFA 108 (90 Base) MCG/ACT inhaler Inhale into the lungs.     Vitamin D, Ergocalciferol, (DRISDOL) 1.25 MG (50000 UNIT) CAPS capsule Take 1 capsule by mouth once a week 4 capsule 0   No current facility-administered medications for this visit.    REVIEW OF SYSTEMS:   Constitutional: ( - ) fevers, ( - )  chills , ( - ) night sweats Eyes: ( - ) blurriness of vision, ( - ) double vision, ( - ) watery eyes Ears, nose, mouth, throat, and face: ( - ) mucositis, ( - ) sore throat Respiratory: ( - ) cough, ( - ) dyspnea, ( - )  wheezes Cardiovascular: ( - ) palpitation, ( - ) chest discomfort, ( - ) lower extremity swelling Gastrointestinal:  ( - ) nausea, ( - ) heartburn, ( - ) change in bowel habits Skin: ( - ) abnormal skin rashes Lymphatics: ( - ) new lymphadenopathy, ( - ) easy bruising Neurological: ( - ) numbness, ( - ) tingling, ( - ) new weaknesses Behavioral/Psych: ( - ) mood change, ( - ) new changes  All other systems were reviewed with the patient and are negative.  PHYSICAL EXAMINATION:  Vitals:   10/14/21 0905  BP: 134/89  Pulse: 96  Resp: 17  Temp: 97.8 F (36.6 C)  SpO2: 97%   Filed Weights   10/14/21 0905  Weight: 278 lb 3.2 oz (126.2 kg)    GENERAL: well appearing middle aged African-American female in NAD  SKIN: skin color, texture, turgor are normal, no rashes or significant lesions EYES: conjunctiva are pink and non-injected, sclera clear LUNGS: clear to auscultation and percussion with normal breathing effort HEART: regular rate & rhythm and no murmurs and no lower extremity edema Musculoskeletal: no cyanosis of digits and no clubbing  PSYCH: alert & oriented x 3, fluent speech NEURO: no focal motor/sensory deficits  LABORATORY DATA:  I have reviewed the data as listed CBC Latest Ref Rng & Units 08/15/2021 06/02/2019 04/23/2018  WBC 3.4 - 10.8 x10E3/uL 13.0(H) 12.9(H) 15.2(H)  Hemoglobin 11.1 - 15.9 g/dL 11.9 10.8(L) 11.6(L)  Hematocrit 34.0 - 46.6 % 35.0 33.4(L) 34.8(L)  Platelets 150 - 450 x10E3/uL 508(H) 487(H) 454(H)    CMP Latest Ref Rng & Units 08/15/2021 04/04/2020 06/02/2019  Glucose 70 - 99 mg/dL 123(H) 145(H) 106(H)  BUN 6 - 20 mg/dL _0 Creatinine 0.57 - 1.00 mg/dL 0.65 0.61 0.64  Sodium 134 - 144 mmol/L 138 141 138  Potassium 3.5 - 5.2 mmol/L 4.6 4.2 4.1  Chloride 96 - 106 mmol/L 99 103 98  CO2 20 - 29 mmol/L _0 Calcium 8.7 - 10.2 mg/dL 9.5 9.2 9.5  Total Protein 6.0 - 8.5 g/dL 7.4 - -  Total Bilirubin 0.0 - 1.2 mg/dL <0.2 - -  Alkaline Phos 44  - 121 IU/L 75 - -  AST 0 - 40 IU/L 12 - -  ALT 0 - 32 IU/L 12 - -     ASSESSMENT & PLAN Dorothy Haynes 36 y.o. female with medical history significant for GERD, diabetes, diverticulitis, hyperlipidemia, hypertension, and obstructive sleep apnea who presents for evaluation of leukocytosis and thrombocytosis.  After review of the labs, review of the records, and discussion with the patient the patients findings are most consistent with a leukocytosis and thrombocytosis of unclear etiology.  At this time there are numerous possible etiologies for these findings.  Leukocytosis and thrombocytosis may be unrelated and the leukocytosis is secondary to obstructive sleep apnea while the thrombocytosis is secondary to iron deficiency.  It is also possible there is an underlying inflammatory condition which could explain the elevation in both.  Additionally myeloproliferative neoplasm could show findings like this.  We will work-up all of these above conditions in order to help determine the etiology.  We will plan to see the patient back in approximately 6 months time or sooner if there are concerning findings in the below blood work.  # Leukocytosis (Neutrophilic predominance)  # Thrombocytosis  -- Today we will order repeat CBC, CMP, and LDH --Additionally we will collect inflammatory markers with ESR and CRP --We will order iron studies in order to assure the thrombocytosis is not being caused by iron deficiency --Patient has a known history of OSA which may be causing leukocytosis --Given the increase in 2 cell lines would recommend pursuing an MPN work-up to include JAK2 with reflex as well as BCR/ABL FISH --Return to clinic placeholder in 6 months time but can be seen sooner if concern for myeloproliferative neoplasm  Orders Placed This Encounter  Procedures   CBC with Differential ([place] Only)    Standing Status:   Future    Number of Occurrences:   1    Standing Expiration Date:    10/14/2022   CMP ([place] only)    Standing Status:   Future    Number of Occurrences:   1    Standing Expiration Date:   10/14/2022   Ferritin    Standing Status:   Future    Number of Occurrences:   1    Standing Expiration Date:   10/14/2022   Iron and Iron Binding Capacity ([facility] only)    Standing Status:   Future    Number of Occurrences:   1    Standing Expiration Date:   10/14/2022   Retic Panel    Standing Status:   Future    Number of Occurrences:   1    Standing Expiration Date:   10/14/2022   Sedimentation rate    Standing Status:   Future    Number of Occurrences:   1    Standing Expiration Date:   10/14/2022   C-reactive protein    Standing Status:   Future    Number of Occurrences:   1    Standing Expiration Date:   10/14/2022   JAK2 (INCLUDING V617F AND EXON 12), MPL,& CALR W/RFL MPN PANEL ([facility])    Standing Status:   Future    Number of Occurrences:  1    Standing Expiration Date:   10/14/2022   BCR ABL1 FISH ([facility])    Standing Status:   Future    Number of Occurrences:   1    Standing Expiration Date:   10/14/2022    All questions were answered. The patient knows to call the clinic with any problems, questions or concerns.  A total of more than 60 minutes were spent on this encounter with face-to-face time and non-face-to-face time, including preparing to see the patient, ordering tests and/or medications, counseling the patient and coordination of care as outlined above.   Ledell Peoples, MD Department of Hematology/Oncology [place] at [facility] Phone: [phone] Pager: [phone] Email: [email or web address]  10/14/2021 9:51 AM

## 2021-10-14 ENCOUNTER — Inpatient Hospital Stay: Payer: BC Managed Care – PPO

## 2021-10-14 ENCOUNTER — Inpatient Hospital Stay: Payer: BC Managed Care – PPO | Attending: Hematology and Oncology | Admitting: Hematology and Oncology

## 2021-10-14 ENCOUNTER — Other Ambulatory Visit: Payer: Self-pay

## 2021-10-14 VITALS — BP 134/89 | HR 96 | Temp 97.8°F | Resp 17 | Wt 278.2 lb

## 2021-10-14 DIAGNOSIS — Z79899 Other long term (current) drug therapy: Secondary | ICD-10-CM | POA: Diagnosis not present

## 2021-10-14 DIAGNOSIS — G4733 Obstructive sleep apnea (adult) (pediatric): Secondary | ICD-10-CM | POA: Diagnosis not present

## 2021-10-14 DIAGNOSIS — D75839 Thrombocytosis, unspecified: Secondary | ICD-10-CM | POA: Diagnosis not present

## 2021-10-14 DIAGNOSIS — Z87891 Personal history of nicotine dependence: Secondary | ICD-10-CM | POA: Insufficient documentation

## 2021-10-14 DIAGNOSIS — Z794 Long term (current) use of insulin: Secondary | ICD-10-CM | POA: Diagnosis not present

## 2021-10-14 DIAGNOSIS — Z7984 Long term (current) use of oral hypoglycemic drugs: Secondary | ICD-10-CM | POA: Diagnosis not present

## 2021-10-14 DIAGNOSIS — D72829 Elevated white blood cell count, unspecified: Secondary | ICD-10-CM | POA: Diagnosis not present

## 2021-10-14 DIAGNOSIS — D72825 Bandemia: Secondary | ICD-10-CM | POA: Diagnosis not present

## 2021-10-14 LAB — CBC WITH DIFFERENTIAL (CANCER CENTER ONLY)
Abs Immature Granulocytes: 0.05 10*3/uL (ref 0.00–0.07)
Basophils Absolute: 0.1 10*3/uL (ref 0.0–0.1)
Basophils Relative: 0 %
Eosinophils Absolute: 0.2 10*3/uL (ref 0.0–0.5)
Eosinophils Relative: 1 %
HCT: 35.7 % — ABNORMAL LOW (ref 36.0–46.0)
Hemoglobin: 11.4 g/dL — ABNORMAL LOW (ref 12.0–15.0)
Immature Granulocytes: 0 %
Lymphocytes Relative: 39 %
Lymphs Abs: 5.1 10*3/uL — ABNORMAL HIGH (ref 0.7–4.0)
MCH: 27 pg (ref 26.0–34.0)
MCHC: 31.9 g/dL (ref 30.0–36.0)
MCV: 84.4 fL (ref 80.0–100.0)
Monocytes Absolute: 0.5 10*3/uL (ref 0.1–1.0)
Monocytes Relative: 4 %
Neutro Abs: 7 10*3/uL (ref 1.7–7.7)
Neutrophils Relative %: 56 %
Platelet Count: 448 10*3/uL — ABNORMAL HIGH (ref 150–400)
RBC: 4.23 MIL/uL (ref 3.87–5.11)
RDW: 14.2 % (ref 11.5–15.5)
WBC Count: 12.8 10*3/uL — ABNORMAL HIGH (ref 4.0–10.5)
nRBC: 0 % (ref 0.0–0.2)

## 2021-10-14 LAB — CMP (CANCER CENTER ONLY)
ALT: 9 U/L (ref 0–44)
AST: 9 U/L — ABNORMAL LOW (ref 15–41)
Albumin: 4.2 g/dL (ref 3.5–5.0)
Alkaline Phosphatase: 65 U/L (ref 38–126)
Anion gap: 9 (ref 5–15)
BUN: 14 mg/dL (ref 6–20)
CO2: 26 mmol/L (ref 22–32)
Calcium: 9.3 mg/dL (ref 8.9–10.3)
Chloride: 104 mmol/L (ref 98–111)
Creatinine: 0.7 mg/dL (ref 0.44–1.00)
GFR, Estimated: >60 mL/min (ref >60)
Glucose, Bld: 135 mg/dL — ABNORMAL HIGH (ref 70–99)
Potassium: 3.6 mmol/L (ref 3.5–5.1)
Sodium: 139 mmol/L (ref 135–145)
Total Bilirubin: 0.3 mg/dL (ref 0.3–1.2)
Total Protein: 7.6 g/dL (ref 6.5–8.1)

## 2021-10-14 LAB — RETIC PANEL
Immature Retic Fract: 23.3 % — ABNORMAL HIGH (ref 2.3–15.9)
RBC.: 4.1 MIL/uL (ref 3.87–5.11)
Retic Count, Absolute: 69.7 10*3/uL (ref 19.0–186.0)
Retic Ct Pct: 1.7 % (ref 0.4–3.1)
Reticulocyte Hemoglobin: 30.7 pg (ref >27.9)

## 2021-10-14 LAB — IRON AND IRON BINDING CAPACITY (CC-WL,HP ONLY)
Iron: 35 ug/dL (ref 28–170)
Saturation Ratios: 9 % — ABNORMAL LOW (ref 10.4–31.8)
TIBC: 377 ug/dL (ref 250–450)
UIBC: 342 ug/dL (ref 148–442)

## 2021-10-14 LAB — C-REACTIVE PROTEIN: CRP: 1.1 mg/dL — ABNORMAL HIGH (ref <1.0)

## 2021-10-14 LAB — FERRITIN: Ferritin: 61 ng/mL (ref 11–307)

## 2021-10-14 LAB — SEDIMENTATION RATE: Sed Rate: 39 mm/hr — ABNORMAL HIGH (ref 0–22)

## 2021-10-15 ENCOUNTER — Ambulatory Visit (INDEPENDENT_AMBULATORY_CARE_PROVIDER_SITE_OTHER): Payer: BC Managed Care – PPO | Admitting: Family Medicine

## 2021-10-15 ENCOUNTER — Telehealth: Payer: Self-pay | Admitting: Hematology and Oncology

## 2021-10-15 ENCOUNTER — Ambulatory Visit (INDEPENDENT_AMBULATORY_CARE_PROVIDER_SITE_OTHER): Payer: BC Managed Care – PPO | Admitting: Ophthalmology

## 2021-10-15 ENCOUNTER — Encounter (INDEPENDENT_AMBULATORY_CARE_PROVIDER_SITE_OTHER): Payer: Self-pay | Admitting: Ophthalmology

## 2021-10-15 ENCOUNTER — Encounter (INDEPENDENT_AMBULATORY_CARE_PROVIDER_SITE_OTHER): Payer: Self-pay | Admitting: Family Medicine

## 2021-10-15 VITALS — BP 121/82 | HR 93 | Temp 97.9°F | Ht 63.0 in | Wt 272.0 lb

## 2021-10-15 DIAGNOSIS — D72828 Other elevated white blood cell count: Secondary | ICD-10-CM | POA: Diagnosis not present

## 2021-10-15 DIAGNOSIS — Z6841 Body Mass Index (BMI) 40.0 and over, adult: Secondary | ICD-10-CM | POA: Diagnosis not present

## 2021-10-15 DIAGNOSIS — Z9989 Dependence on other enabling machines and devices: Secondary | ICD-10-CM | POA: Diagnosis not present

## 2021-10-15 DIAGNOSIS — I10 Essential (primary) hypertension: Secondary | ICD-10-CM

## 2021-10-15 DIAGNOSIS — E119 Type 2 diabetes mellitus without complications: Secondary | ICD-10-CM

## 2021-10-15 DIAGNOSIS — G4733 Obstructive sleep apnea (adult) (pediatric): Secondary | ICD-10-CM | POA: Diagnosis not present

## 2021-10-15 DIAGNOSIS — H25813 Combined forms of age-related cataract, bilateral: Secondary | ICD-10-CM | POA: Diagnosis not present

## 2021-10-15 DIAGNOSIS — E1169 Type 2 diabetes mellitus with other specified complication: Secondary | ICD-10-CM

## 2021-10-15 DIAGNOSIS — E669 Obesity, unspecified: Secondary | ICD-10-CM | POA: Diagnosis not present

## 2021-10-15 DIAGNOSIS — H35033 Hypertensive retinopathy, bilateral: Secondary | ICD-10-CM

## 2021-10-15 MED ORDER — TIRZEPATIDE 10 MG/0.5ML ~~LOC~~ SOAJ: 10.0000 mg | SUBCUTANEOUS | 1 refills | Status: DC

## 2021-10-15 MED ORDER — TIRZEPATIDE 7.5 MG/0.5ML ~~LOC~~ SOAJ: 7.5000 mg | SUBCUTANEOUS | 1 refills | Status: DC

## 2021-10-15 MED ORDER — TIRZEPATIDE 5 MG/0.5ML ~~LOC~~ SOAJ: 5.0000 mg | SUBCUTANEOUS | 1 refills | Status: DC

## 2021-10-15 NOTE — Telephone Encounter (Signed)
Scheduled per 2/13 los, pt has been called and confirmed  °

## 2021-10-16 ENCOUNTER — Encounter (INDEPENDENT_AMBULATORY_CARE_PROVIDER_SITE_OTHER): Payer: Self-pay | Admitting: Ophthalmology

## 2021-10-16 NOTE — Progress Notes (Signed)
Chief Complaint:   OBESITY Dorothy Haynes is here to discuss her progress with her obesity treatment plan along with follow-up of her obesity related diagnoses. See Medical Weight Management Flowsheet for complete bioelectrical impedance results.  Today's visit was #: 3 Starting weight: 275 lbs Starting date: 10/16/2020 Weight change since last visit: 3 lbs Total lbs lost to date: 3 lbs Total weight loss percentage to date: -1.09%  Nutrition Plan: Category 3 Plan for 0% of the time.  Activity: None. Anti-obesity medications: Mounjaro 2.5 mg subcutaneously weekly. Reported side effects: None.  Interim History: Dorothy Haynes says that Dorothy Haynes worked well, but unable to get for the last 2 weeks due to insurance issues.  Assessment/Plan:   1. Type 2 diabetes mellitus with other specified complication, without long-term current use of insulin (HCC) Diabetes Mellitus: Not at goal. Medication: Mounjaro 2.5 mg subcutaneously weekly.  She has been off this for 2 weeks. Issues reviewed: blood sugar goals, complications of diabetes mellitus, hypoglycemia prevention and treatment, exercise, and nutrition.  Plan:  Restart Mounjaro at 5 mg subcutaneously weekly.  Continue glimepiride.  Okay to stop metformin. The patient will continue to focus on protein-rich, low simple carbohydrate foods. We reviewed the importance of hydration, regular exercise for stress reduction, and restorative sleep.   Lab Results  Component Value Date   HGBA1C 7.3 (H) 08/15/2021   HGBA1C 7.9 (A) 01/15/2021   HGBA1C 6.5 (A) 10/09/2020   Lab Results  Component Value Date   LDLCALC 51 08/15/2021   CREATININE 0.70 10/14/2021   - Start tirzepatide [facility]) 5 MG/0.5ML Pen; Inject 5 mg into the skin once a week. (Patient not taking: Reported on 10/15/2021)  Dispense: 6 mL; Refill: 1 - tirzepatide (MOUNJARO) 7.5 MG/0.5ML Pen; Inject 7.5 mg into the skin once a week. (Patient not taking: Reported on 10/15/2021)  Dispense: 6 mL;  Refill: 1 - tirzepatide (MOUNJARO) 10 MG/0.5ML Pen; Inject 10 mg into the skin once a week. (Patient not taking: Reported on 10/15/2021)  Dispense: 6 mL; Refill: 1  2. OSA on CPAP Dorothy Haynes says she has been wearing her CPAP more often and is getting less headaches.  OSA is a cause of systemic hypertension and is associated with an increased incidence of stroke, heart failure, atrial fibrillation, and coronary heart disease. Severe OSA increases all-cause mortality and cardiovascular mortality.   Goal: Treatment of OSA via CPAP compliance and weight loss. Plasma ghrelin levels (appetite or "hunger hormone") are significantly higher in OSA patients than in BMI-matched controls, but decrease to levels similar to those of obese patients without OSA after CPAP treatment.  Weight loss improves OSA by several mechanisms, including reduction in fatty tissue in the throat (i.e. parapharyngeal fat) and the tongue. Loss of abdominal fat increases mediastinal traction on the upper airway making it less likely to collapse during sleep. Studies have also shown that compliance with CPAP treatment improves leptin (hunger inhibitory hormone) imbalance.  3. Other elevated white blood cell (WBC) count Reviewed Hematology visit from yesterday.  4. Obesity, current BMI 48.3  Course: Dorothy Haynes is currently in the action stage of change. As such, her goal is to continue with weight loss efforts.   Nutrition goals: She has agreed to the Category 3 Plan.   Exercise goals: All adults should avoid inactivity. Some physical activity is better than none, and adults who participate in any amount of physical activity gain some health benefits.  Behavioral modification strategies: increasing lean protein intake, decreasing simple carbohydrates, increasing vegetables, and increasing  water intake.  Dorothy Haynes has agreed to follow-up with our clinic in 4 weeks. She was informed of the importance of frequent follow-up visits to  maximize her success with intensive lifestyle modifications for her multiple health conditions.   Objective:   Blood pressure 121/82, pulse 93, temperature 97.9 F (36.6 C), temperature source Oral, height 5\' 3"  (1.6 m), weight 272 lb (123.4 kg), SpO2 98 %. Body mass index is 48.18 kg/m.  General: Cooperative, alert, well developed, in no acute distress. HEENT: Conjunctivae and lids unremarkable. Cardiovascular: Regular rhythm.  Lungs: Normal work of breathing. Neurologic: No focal deficits.   Lab Results  Component Value Date   CREATININE 0.70 10/14/2021   BUN 14 10/14/2021   NA 139 10/14/2021   K 3.6 10/14/2021   CL 104 10/14/2021   CO2 26 10/14/2021   Lab Results  Component Value Date   ALT 9 10/14/2021   AST 9 (L) 10/14/2021   ALKPHOS 65 10/14/2021   BILITOT 0.3 10/14/2021   Lab Results  Component Value Date   HGBA1C 7.3 (H) 08/15/2021   HGBA1C 7.9 (A) 01/15/2021   HGBA1C 6.5 (A) 10/09/2020   HGBA1C 6.1 (A) 07/10/2020   HGBA1C 6.9 04/04/2020   Lab Results  Component Value Date   TSH 1.540 08/15/2021   Lab Results  Component Value Date   CHOL 125 08/15/2021   HDL 50 08/15/2021   LDLCALC 51 08/15/2021   TRIG 139 08/15/2021   CHOLHDL 2.5 08/15/2021   Lab Results  Component Value Date   VD25OH 25.1 (L) 08/15/2021   Lab Results  Component Value Date   WBC 12.8 (H) 10/14/2021   HGB 11.4 (L) 10/14/2021   HCT 35.7 (L) 10/14/2021   MCV 84.4 10/14/2021   PLT 448 (H) 10/14/2021   Lab Results  Component Value Date   IRON 35 10/14/2021   TIBC 377 10/14/2021   FERRITIN 61 10/14/2021   Attestation Statements:   Reviewed by clinician on day of visit: allergies, medications, problem list, medical history, surgical history, family history, social history, and previous encounter notes.  I, 10/16/2021, CMA, am acting as transcriptionist for Insurance claims handler, DO  I have reviewed the above documentation for accuracy and completeness, and I agree with the above.  -  Helane Rima, DO, MS, FAAFP, DABOM - Family and Bariatric Medicine.

## 2021-10-21 LAB — JAK2 (INCLUDING V617F AND EXON 12), MPL,& CALR W/RFL MPN PANEL (NGS)

## 2021-10-21 LAB — BCR ABL1 FISH (GENPATH)

## 2021-10-29 ENCOUNTER — Telehealth: Payer: Self-pay | Admitting: *Deleted

## 2021-10-29 MED ORDER — FERROUS SULFATE 325 (65 FE) MG PO TABS: 325.0000 mg | ORAL_TABLET | Freq: Every day | ORAL | 5 refills | Status: DC

## 2021-10-29 NOTE — Telephone Encounter (Signed)
-----   Message from Jaci Standard, MD sent at 10/29/2021 11:33 AM EST ----- Please let Dorothy Haynes know that her blood work showed no evidence of a bone marrow disorder.  Because of her elevated white blood cell count is her sleep apnea.  She was found to be mildly iron deficient and would benefit from oral iron therapy.  Please call in ferrous sulfate 325 mg p.o. daily to a pharmacy of her choice.  We will plan to see her back in August 2023.  ----- Message ----- From: Leory Plowman, Lab In [place] Sent: 10/14/2021  10:06 AM EST To: Jaci Standard, MD

## 2021-10-29 NOTE — Telephone Encounter (Signed)
Notified of message below. Pt verbalized understanding.

## 2021-11-19 ENCOUNTER — Ambulatory Visit (INDEPENDENT_AMBULATORY_CARE_PROVIDER_SITE_OTHER): Payer: Medicaid Other | Admitting: Family Medicine

## 2021-11-19 ENCOUNTER — Encounter (INDEPENDENT_AMBULATORY_CARE_PROVIDER_SITE_OTHER): Payer: Self-pay | Admitting: Family Medicine

## 2021-11-19 ENCOUNTER — Other Ambulatory Visit: Payer: Self-pay

## 2021-11-19 VITALS — BP 130/83 | HR 91 | Temp 98.0°F | Ht 63.0 in | Wt 269.0 lb

## 2021-11-19 DIAGNOSIS — Z6841 Body Mass Index (BMI) 40.0 and over, adult: Secondary | ICD-10-CM | POA: Diagnosis not present

## 2021-11-19 DIAGNOSIS — E1159 Type 2 diabetes mellitus with other circulatory complications: Secondary | ICD-10-CM | POA: Diagnosis not present

## 2021-11-19 DIAGNOSIS — Z7985 Long-term (current) use of injectable non-insulin antidiabetic drugs: Secondary | ICD-10-CM | POA: Diagnosis not present

## 2021-11-19 DIAGNOSIS — E785 Hyperlipidemia, unspecified: Secondary | ICD-10-CM

## 2021-11-19 DIAGNOSIS — R252 Cramp and spasm: Secondary | ICD-10-CM

## 2021-11-19 DIAGNOSIS — E669 Obesity, unspecified: Secondary | ICD-10-CM | POA: Diagnosis not present

## 2021-11-19 DIAGNOSIS — I152 Hypertension secondary to endocrine disorders: Secondary | ICD-10-CM | POA: Diagnosis not present

## 2021-11-19 DIAGNOSIS — E1169 Type 2 diabetes mellitus with other specified complication: Secondary | ICD-10-CM | POA: Diagnosis not present

## 2021-11-19 MED ORDER — POTASSIUM CHLORIDE CRYS ER 10 MEQ PO TBCR: 10.0000 meq | EXTENDED_RELEASE_TABLET | Freq: Every day | ORAL | 0 refills | Status: DC

## 2021-11-25 DIAGNOSIS — Z6841 Body Mass Index (BMI) 40.0 and over, adult: Secondary | ICD-10-CM | POA: Diagnosis not present

## 2021-11-25 DIAGNOSIS — N921 Excessive and frequent menstruation with irregular cycle: Secondary | ICD-10-CM | POA: Diagnosis not present

## 2021-11-25 DIAGNOSIS — I10 Essential (primary) hypertension: Secondary | ICD-10-CM | POA: Diagnosis not present

## 2021-11-25 DIAGNOSIS — E1165 Type 2 diabetes mellitus with hyperglycemia: Secondary | ICD-10-CM | POA: Diagnosis not present

## 2021-11-25 DIAGNOSIS — E785 Hyperlipidemia, unspecified: Secondary | ICD-10-CM | POA: Diagnosis not present

## 2021-11-25 DIAGNOSIS — D649 Anemia, unspecified: Secondary | ICD-10-CM | POA: Diagnosis not present

## 2021-11-28 NOTE — Progress Notes (Signed)
Chief Complaint:   OBESITY Dorothy Haynes is here to discuss her progress with her obesity treatment plan along with follow-up of her obesity related diagnoses. See Medical Weight Management Flowsheet for complete bioelectrical impedance results.  Today's visit was #: 4 Starting weight: 275 lbs Starting date: 10/16/2020 Weight change since last visit:  3 lbs  Total lbs lost to date: 6 lbs Total weight loss percentage to date: -2.18%  Nutrition Plan: Category 3 Plan for 0% of the time.  Activity: None. Anti-obesity medications: Mounjaro 5 mg subcutaneously weekly. Reported side effects: None.  Interim History: Dorothy Haynes is on Mounjaro 5 mg.  She says that she is doing well.  She endorses some leg cramps at night.  Assessment/Plan:   1. Muscle cramps Start potassium 10-20 meQ at bedtime for muscle cramps.  - Start potassium chloride (KLOR-CON M) 10 MEQ tablet; Take 1-2 tablets (10-20 mEq total) by mouth at bedtime.  Dispense: 24 tablet; Refill: 0  2. Type 2 diabetes mellitus with other specified complication, without long-term current use of insulin (HCC) Diabetes Mellitus: Not at goal. Medication: Mounjaro 5 mg subcutaneously weekly. Issues reviewed: blood sugar goals, complications of diabetes mellitus, hypoglycemia prevention and treatment, exercise, and nutrition.  Plan: Continue Mounjaro 5 mg subcutaneously weekly.  The patient will continue to focus on protein-rich, low simple carbohydrate foods. We reviewed the importance of hydration, regular exercise for stress reduction, and restorative sleep.   Lab Results  Component Value Date   HGBA1C 7.3 (H) 08/15/2021   HGBA1C 7.9 (A) 01/15/2021   HGBA1C 6.5 (A) 10/09/2020   Lab Results  Component Value Date   LDLCALC 51 08/15/2021   CREATININE 0.70 10/14/2021   3. Hypertension associated with diabetes (HCC) At goal. Medications: Cozaar 25 mg daily, .   Plan: Avoid buying foods that are: processed, frozen, or prepackaged to  avoid excess salt. We will watch for signs of hypotension as she continues lifestyle modifications.  BP Readings from Last 3 Encounters:  11/19/21 130/83  10/15/21 121/82  10/14/21 134/89   Lab Results  Component Value Date   CREATININE 0.70 10/14/2021   4. Hyperlipidemia associated with type 2 diabetes mellitus (HCC) Course: Controlled. Lipid-lowering medications: Crestor 10 mg daily.   Plan: Dietary changes: Increase soluble fiber, decrease simple carbohydrates, decrease saturated fat. Exercise changes: Moderate to vigorous-intensity aerobic activity 150 minutes per week or as tolerated. We will continue to monitor along with PCP/specialists as it pertains to her weight loss journey.  Lab Results  Component Value Date   CHOL 125 08/15/2021   HDL 50 08/15/2021   LDLCALC 51 08/15/2021   TRIG 139 08/15/2021   CHOLHDL 2.5 08/15/2021   Lab Results  Component Value Date   ALT 9 10/14/2021   AST 9 (L) 10/14/2021   ALKPHOS 65 10/14/2021   BILITOT 0.3 10/14/2021   5. Obesity, current BMI 47.7  Course: Dorothy Haynes is currently in the action stage of change. As such, her goal is to continue with weight loss efforts.   Nutrition goals: She has agreed to the Category 3 Plan.   Exercise goals:  As is.  Behavioral modification strategies: increasing lean protein intake, decreasing simple carbohydrates, and increasing vegetables.  Dorothy Haynes has agreed to follow-up with our clinic in 6 weeks. She was informed of the importance of frequent follow-up visits to maximize her success with intensive lifestyle modifications for her multiple health conditions.   Objective:   Blood pressure 130/83, pulse 91, temperature 98 F (36.7 C), temperature  source Oral, height 5\' 3"  (1.6 m), weight 269 lb (122 kg), SpO2 99 %. Body mass index is 47.65 kg/m.  General: Cooperative, alert, well developed, in no acute distress. HEENT: Conjunctivae and lids unremarkable. Cardiovascular: Regular rhythm.   Lungs: Normal work of breathing. Neurologic: No focal deficits.   Lab Results  Component Value Date   CREATININE 0.70 10/14/2021   BUN 14 10/14/2021   NA 139 10/14/2021   K 3.6 10/14/2021   CL 104 10/14/2021   CO2 26 10/14/2021   Lab Results  Component Value Date   ALT 9 10/14/2021   AST 9 (L) 10/14/2021   ALKPHOS 65 10/14/2021   BILITOT 0.3 10/14/2021   Lab Results  Component Value Date   HGBA1C 7.3 (H) 08/15/2021   HGBA1C 7.9 (A) 01/15/2021   HGBA1C 6.5 (A) 10/09/2020   HGBA1C 6.1 (A) 07/10/2020   HGBA1C 6.9 04/04/2020   Lab Results  Component Value Date   TSH 1.540 08/15/2021   Lab Results  Component Value Date   CHOL 125 08/15/2021   HDL 50 08/15/2021   LDLCALC 51 08/15/2021   TRIG 139 08/15/2021   CHOLHDL 2.5 08/15/2021   Lab Results  Component Value Date   VD25OH 25.1 (L) 08/15/2021   Lab Results  Component Value Date   WBC 12.8 (H) 10/14/2021   HGB 11.4 (L) 10/14/2021   HCT 35.7 (L) 10/14/2021   MCV 84.4 10/14/2021   PLT 448 (H) 10/14/2021   Lab Results  Component Value Date   IRON 35 10/14/2021   TIBC 377 10/14/2021   FERRITIN 61 10/14/2021   Attestation Statements:   Reviewed by clinician on day of visit: allergies, medications, problem list, medical history, surgical history, family history, social history, and previous encounter notes.  I, 10/16/2021, CMA, am acting as transcriptionist for Insurance claims handler, DO  I have reviewed the above documentation for accuracy and completeness, and I agree with the above. -  Helane Rima, DO, MS, FAAFP, DABOM - Family and Bariatric Medicine.

## 2021-12-24 ENCOUNTER — Encounter: Payer: Self-pay | Admitting: Lactation Services

## 2021-12-24 ENCOUNTER — Ambulatory Visit (INDEPENDENT_AMBULATORY_CARE_PROVIDER_SITE_OTHER): Payer: BC Managed Care – PPO | Admitting: Lactation Services

## 2021-12-24 VITALS — BP 127/90 | HR 91 | Wt 276.0 lb

## 2021-12-24 DIAGNOSIS — Z3042 Encounter for surveillance of injectable contraceptive: Secondary | ICD-10-CM

## 2021-12-24 MED ORDER — MEDROXYPROGESTERONE ACETATE 150 MG/ML IM SUSP: 150.0000 mg | INTRAMUSCULAR | 0 refills | Status: DC

## 2021-12-24 MED ORDER — MEDROXYPROGESTERONE ACETATE 150 MG/ML IM SUSP
150.0000 mg | Freq: Once | INTRAMUSCULAR | Status: AC
Start: 2021-12-24 — End: 2021-12-24
  Administered 2021-12-24: 150 mg via INTRAMUSCULAR

## 2021-12-24 NOTE — Progress Notes (Signed)
Dorothy Haynes here for Depo-Provera Injection. Injection administered without complication. Patient will return in 3 months for next injection between  03/11/2022 and 03/25/2022. Next annual visit due August 2023, will need to schedule at next Depo appointment. Patient reports she is not having cycles currently, she gets occasional cramping.  ? ?BP slightly elevated. She takes Losartan daily and follows with PCP. She was upset about a large bill she received from a Medical provider just before BP was taken. Initial BP was 136/93, repeat 127/90. She reports is occasionally runs high. She reports she is feeling normal for her.   ? ?Ed Blalock, RN ?12/24/2021  9:00 AM  ? ?

## 2022-01-07 DIAGNOSIS — G4733 Obstructive sleep apnea (adult) (pediatric): Secondary | ICD-10-CM | POA: Diagnosis not present

## 2022-01-07 DIAGNOSIS — I10 Essential (primary) hypertension: Secondary | ICD-10-CM | POA: Diagnosis not present

## 2022-01-07 DIAGNOSIS — E1169 Type 2 diabetes mellitus with other specified complication: Secondary | ICD-10-CM | POA: Diagnosis not present

## 2022-01-07 DIAGNOSIS — K219 Gastro-esophageal reflux disease without esophagitis: Secondary | ICD-10-CM | POA: Diagnosis not present

## 2022-01-07 DIAGNOSIS — E1159 Type 2 diabetes mellitus with other circulatory complications: Secondary | ICD-10-CM | POA: Diagnosis not present

## 2022-02-17 ENCOUNTER — Other Ambulatory Visit: Payer: Self-pay | Admitting: Family Medicine

## 2022-02-17 DIAGNOSIS — Z9109 Other allergy status, other than to drugs and biological substances: Secondary | ICD-10-CM

## 2022-02-24 DIAGNOSIS — L918 Other hypertrophic disorders of the skin: Secondary | ICD-10-CM | POA: Diagnosis not present

## 2022-02-24 DIAGNOSIS — I10 Essential (primary) hypertension: Secondary | ICD-10-CM | POA: Diagnosis not present

## 2022-02-24 DIAGNOSIS — E785 Hyperlipidemia, unspecified: Secondary | ICD-10-CM | POA: Diagnosis not present

## 2022-02-24 DIAGNOSIS — E1165 Type 2 diabetes mellitus with hyperglycemia: Secondary | ICD-10-CM | POA: Diagnosis not present

## 2022-02-24 DIAGNOSIS — Z6841 Body Mass Index (BMI) 40.0 and over, adult: Secondary | ICD-10-CM | POA: Diagnosis not present

## 2022-02-24 DIAGNOSIS — D649 Anemia, unspecified: Secondary | ICD-10-CM | POA: Diagnosis not present

## 2022-02-24 DIAGNOSIS — J309 Allergic rhinitis, unspecified: Secondary | ICD-10-CM | POA: Diagnosis not present

## 2022-03-11 ENCOUNTER — Ambulatory Visit (INDEPENDENT_AMBULATORY_CARE_PROVIDER_SITE_OTHER): Payer: BC Managed Care – PPO

## 2022-03-11 ENCOUNTER — Other Ambulatory Visit: Payer: Self-pay

## 2022-03-11 VITALS — BP 133/94 | HR 90 | Ht 63.0 in | Wt 262.9 lb

## 2022-03-11 DIAGNOSIS — Z3042 Encounter for surveillance of injectable contraceptive: Secondary | ICD-10-CM

## 2022-03-11 MED ORDER — MEDROXYPROGESTERONE ACETATE 150 MG/ML IM SUSP
150.0000 mg | Freq: Once | INTRAMUSCULAR | Status: AC
Start: 2022-03-11 — End: 2022-03-11
  Administered 2022-03-11: 150 mg via INTRAMUSCULAR

## 2022-03-11 NOTE — Progress Notes (Signed)
Dorothy Haynes here for Depo-Provera Injection. Injection administered without complication. Patient will return in 3 months for next injection between Sept 26 and Oct 10,2023. Next annual visit due August 2023.   Isabell Jarvis, RN 03/11/2022  8:28 AM

## 2022-03-19 DIAGNOSIS — L918 Other hypertrophic disorders of the skin: Secondary | ICD-10-CM | POA: Diagnosis not present

## 2022-03-31 DIAGNOSIS — L918 Other hypertrophic disorders of the skin: Secondary | ICD-10-CM | POA: Diagnosis not present

## 2022-03-31 DIAGNOSIS — D509 Iron deficiency anemia, unspecified: Secondary | ICD-10-CM | POA: Diagnosis not present

## 2022-03-31 DIAGNOSIS — E7800 Pure hypercholesterolemia, unspecified: Secondary | ICD-10-CM | POA: Diagnosis not present

## 2022-03-31 DIAGNOSIS — G4733 Obstructive sleep apnea (adult) (pediatric): Secondary | ICD-10-CM | POA: Diagnosis not present

## 2022-03-31 DIAGNOSIS — I10 Essential (primary) hypertension: Secondary | ICD-10-CM | POA: Diagnosis not present

## 2022-03-31 DIAGNOSIS — M545 Low back pain, unspecified: Secondary | ICD-10-CM | POA: Diagnosis not present

## 2022-03-31 DIAGNOSIS — E1169 Type 2 diabetes mellitus with other specified complication: Secondary | ICD-10-CM | POA: Diagnosis not present

## 2022-04-09 ENCOUNTER — Encounter (INDEPENDENT_AMBULATORY_CARE_PROVIDER_SITE_OTHER): Payer: Self-pay

## 2022-04-11 ENCOUNTER — Inpatient Hospital Stay: Payer: BC Managed Care – PPO | Admitting: Hematology and Oncology

## 2022-04-11 ENCOUNTER — Inpatient Hospital Stay: Payer: BC Managed Care – PPO | Attending: Family Medicine

## 2022-04-11 ENCOUNTER — Other Ambulatory Visit: Payer: Self-pay | Admitting: Hematology and Oncology

## 2022-04-11 DIAGNOSIS — D75839 Thrombocytosis, unspecified: Secondary | ICD-10-CM

## 2022-04-14 ENCOUNTER — Ambulatory Visit: Payer: Medicaid Other | Admitting: Hematology and Oncology

## 2022-04-14 ENCOUNTER — Other Ambulatory Visit: Payer: Medicaid Other

## 2022-04-22 ENCOUNTER — Ambulatory Visit (INDEPENDENT_AMBULATORY_CARE_PROVIDER_SITE_OTHER): Payer: BC Managed Care – PPO | Admitting: Plastic Surgery

## 2022-04-22 ENCOUNTER — Encounter: Payer: Self-pay | Admitting: Plastic Surgery

## 2022-04-22 VITALS — BP 128/90 | HR 92 | Ht 63.5 in | Wt 264.4 lb

## 2022-04-22 DIAGNOSIS — Z794 Long term (current) use of insulin: Secondary | ICD-10-CM

## 2022-04-22 DIAGNOSIS — M545 Low back pain, unspecified: Secondary | ICD-10-CM | POA: Diagnosis not present

## 2022-04-22 DIAGNOSIS — Z6841 Body Mass Index (BMI) 40.0 and over, adult: Secondary | ICD-10-CM | POA: Diagnosis not present

## 2022-04-22 DIAGNOSIS — Z720 Tobacco use: Secondary | ICD-10-CM

## 2022-04-22 DIAGNOSIS — E1169 Type 2 diabetes mellitus with other specified complication: Secondary | ICD-10-CM

## 2022-04-22 DIAGNOSIS — R21 Rash and other nonspecific skin eruption: Secondary | ICD-10-CM

## 2022-04-22 DIAGNOSIS — M546 Pain in thoracic spine: Secondary | ICD-10-CM | POA: Diagnosis not present

## 2022-04-22 DIAGNOSIS — N62 Hypertrophy of breast: Secondary | ICD-10-CM

## 2022-04-22 DIAGNOSIS — E119 Type 2 diabetes mellitus without complications: Secondary | ICD-10-CM | POA: Diagnosis not present

## 2022-04-22 DIAGNOSIS — M542 Cervicalgia: Secondary | ICD-10-CM

## 2022-04-22 NOTE — Progress Notes (Signed)
Patient ID: Dorothy Haynes, female    DOB: [birth year], 36 y.o.   MRN: [id number]   Chief Complaint  Patient presents with   Advice Only   Breast Problem    Mammary Hyperplasia: The patient is a 36 y.o. female with a history of mammary hyperplasia for several years.  She has extremely large breasts causing symptoms that include the following: Back pain in the upper and lower back, including neck pain. She pulls or pins her bra straps to provide better lift and relief of the pressure and pain. She notices relief by holding her breast up manually.  Her shoulder straps cause grooves and pain and pressure that requires padding for relief. Pain medication is sometimes required with motrin and tylenol.  Activities that are hindered by enlarged breasts include: exercise and running.  She has tried supportive clothing as well as fitted bras without improvement.  Her breasts are extremely large and fairly symmetric.  She has hyperpigmentation of the inframammary area on both sides.  The sternal to nipple distance on the right is 44 cm and the left is 45 cm.  The IMF distance is 24 cm.  She is 5 feet 3 inches tall and weighs 264 pounds.  The BMI = 46.8 kg/m.  Preoperative bra size = 48 H cup. She would like to be a D/DD. The estimated excess breast tissue to be removed at the time of surgery = 800-900 grams on the left and 800-900 grams on the right.  Mammogram history:   Family history of breast cancer:  none.  Tobacco use:  quit 4 years ago.   The patient expresses the desire to pursue surgical intervention. Past surgical history included gallbladder.     Review of Systems  Constitutional: Negative.   HENT: Negative.    Eyes: Negative.   Respiratory: Negative.  Negative for chest tightness and shortness of breath.   Cardiovascular: Negative.   Gastrointestinal: Negative.   Endocrine: Negative.   Genitourinary: Negative.   Musculoskeletal:  Positive for back pain and neck pain.  Skin:   Positive for rash.  Hematological: Negative.   Psychiatric/Behavioral: Negative.      Past Medical History:  Diagnosis Date   Anxiety    Back pain    Diabetes (HCC)    Diverticulitis 03/15/2018   Gallbladder problem    GERD (gastroesophageal reflux disease) 2009   Gestational diabetes 2008   Headache    History of gallstones 10/2015   s/p cholecystectomy 10/09/15   Hyperlipidemia    Hypertension    Joint pain    Menorrhagia with irregular cycle 10/23/2016   Morbid obesity (HCC) 10/09/2015   Physical deconditioning 06/22/2019   Plantar fasciitis, bilateral 06/02/2019   Shortness of breath dyspnea    Sleep apnea    TMJ (dislocation of temporomandibular joint), initial encounter 06/02/2019   Tobacco abuse 10/09/2015    Past Surgical History:  Procedure Laterality Date   CHOLECYSTECTOMY N/A 10/09/2015   Procedure: LAPAROSCOPIC CHOLECYSTECTOMY ;  Surgeon: Claud Kelp, MD;  Location: [facility];  Service: General;  Laterality: N/A;   WISDOM TOOTH EXTRACTION        Current Outpatient Medications:    calcium-vitamin D (OSCAL WITH D) 500-200 MG-UNIT tablet, Take 1 tablet by mouth 2 (two) times daily., Disp: 60 tablet, Rfl: 11   cetirizine (ZYRTEC) 10 MG tablet, Take 1 tablet by mouth once daily, Disp: 30 tablet, Rfl: 11   ferrous sulfate 325 (65 FE) MG tablet, Take 1 tablet (  325 mg total) by mouth daily with breakfast., Disp: 30 tablet, Rfl: 5   losartan (COZAAR) 25 MG tablet, TAKE 1 TABLET BY MOUTH AT BEDTIME, Disp: 90 tablet, Rfl: 0   metFORMIN (GLUCOPHAGE-XR) 500 MG 24 hr tablet, Take 1,000 mg by mouth 2 (two) times daily., Disp: , Rfl:    potassium chloride (KLOR-CON M) 10 MEQ tablet, Take 1-2 tablets (10-20 mEq total) by mouth at bedtime., Disp: 24 tablet, Rfl: 0   rosuvastatin (CRESTOR) 10 MG tablet, Take 10 mg by mouth at bedtime., Disp: , Rfl:    tirzepatide (MOUNJARO) 10 MG/0.5ML Pen, Inject 10 mg into the skin once a week., Disp: 6 mL, Rfl: 1   VENTOLIN HFA 108 (90 Base)  MCG/ACT inhaler, Inhale into the lungs., Disp: , Rfl:    Vitamin D, Ergocalciferol, (DRISDOL) 1.25 MG (50000 UNIT) CAPS capsule, Take 1 capsule by mouth once a week, Disp: 4 capsule, Rfl: 0   Objective:   Vitals:   04/22/22 1353  BP: (!) 128/90  Pulse: 92  SpO2: 99%    Physical Exam Vitals and nursing note reviewed.  Constitutional:      Appearance: Normal appearance.  HENT:     Head: Normocephalic and atraumatic.  Cardiovascular:     Rate and Rhythm: Normal rate.     Pulses: Normal pulses.  Pulmonary:     Effort: Pulmonary effort is normal.  Abdominal:     General: There is no distension.     Palpations: Abdomen is soft.  Musculoskeletal:        General: No swelling or deformity.  Skin:    General: Skin is warm.     Capillary Refill: Capillary refill takes less than 2 seconds.     Coloration: Skin is not jaundiced or pale.  Neurological:     Mental Status: She is alert and oriented to person, place, and time.  Psychiatric:        Mood and Affect: Mood normal.        Behavior: Behavior normal.        Thought Content: Thought content normal.        Judgment: Judgment normal.     Assessment & Plan:  Hyperlipidemia associated with type 2 diabetes mellitus (HCC)  Symptomatic mammary hypertrophy  Type 2 diabetes mellitus without complication, without long-term current use of insulin (HCC)  Morbid obesity (HCC)  Tobacco abuse  The procedure the patient selected and that was best for the patient was discussed. The risk were discussed and include but not limited to the following:  Breast asymmetry, fluid accumulation, firmness of the breast, inability to breast feed, loss of nipple or areola, skin loss, change in skin and nipple sensation, fat necrosis of the breast tissue, bleeding, infection and healing delay.  There are risks of anesthesia and injury to nerves or blood vessels.  Allergic reaction to tape, suture and skin glue are possible.  There will be swelling.  Any  of these can lead to the need for revisional surgery.  A breast reduction has potential to interfere with diagnostic procedures in the future.  This procedure is best done when the breast is fully developed.  Changes in the breast will continue to occur over time: pregnancy, weight gain or weigh loss.    Total time: 40 minutes. This includes time spent with the patient during the visit as well as time spent before and after the visit reviewing the chart, documenting the encounter, ordering pertinent studies and literature for the patient.  Physical therapy:  none Mammogram:  will order  Candidate for bilateral breast reduction (amputation technique) with possible liposuction.  Pictures were obtained of the patient and placed in the chart with the patient's or guardian's permission.   Alena Bills Adiyah Lame, DO

## 2022-04-23 NOTE — Addendum Note (Signed)
Addended by: Sherol Dade on: 04/23/2022 09:29 AM   Modules accepted: Orders

## 2022-04-28 ENCOUNTER — Telehealth: Payer: Self-pay

## 2022-04-28 NOTE — Telephone Encounter (Signed)
Faxed mammogram order to [facility], received confirmation fax.

## 2022-05-01 DIAGNOSIS — E1169 Type 2 diabetes mellitus with other specified complication: Secondary | ICD-10-CM | POA: Diagnosis not present

## 2022-05-01 DIAGNOSIS — D509 Iron deficiency anemia, unspecified: Secondary | ICD-10-CM | POA: Diagnosis not present

## 2022-05-01 DIAGNOSIS — Z6841 Body Mass Index (BMI) 40.0 and over, adult: Secondary | ICD-10-CM | POA: Diagnosis not present

## 2022-05-01 DIAGNOSIS — Z79899 Other long term (current) drug therapy: Secondary | ICD-10-CM | POA: Diagnosis not present

## 2022-05-10 ENCOUNTER — Encounter: Payer: Self-pay | Admitting: *Deleted

## 2022-05-15 ENCOUNTER — Other Ambulatory Visit: Payer: Self-pay | Admitting: Plastic Surgery

## 2022-05-15 DIAGNOSIS — N62 Hypertrophy of breast: Secondary | ICD-10-CM

## 2022-05-15 DIAGNOSIS — E119 Type 2 diabetes mellitus without complications: Secondary | ICD-10-CM

## 2022-05-20 DIAGNOSIS — M546 Pain in thoracic spine: Secondary | ICD-10-CM | POA: Diagnosis not present

## 2022-05-20 DIAGNOSIS — K219 Gastro-esophageal reflux disease without esophagitis: Secondary | ICD-10-CM | POA: Diagnosis not present

## 2022-05-20 DIAGNOSIS — Z6841 Body Mass Index (BMI) 40.0 and over, adult: Secondary | ICD-10-CM | POA: Diagnosis not present

## 2022-05-20 DIAGNOSIS — Z9189 Other specified personal risk factors, not elsewhere classified: Secondary | ICD-10-CM | POA: Diagnosis not present

## 2022-05-20 DIAGNOSIS — I10 Essential (primary) hypertension: Secondary | ICD-10-CM | POA: Diagnosis not present

## 2022-05-20 DIAGNOSIS — E1169 Type 2 diabetes mellitus with other specified complication: Secondary | ICD-10-CM | POA: Diagnosis not present

## 2022-05-26 DIAGNOSIS — D649 Anemia, unspecified: Secondary | ICD-10-CM | POA: Diagnosis not present

## 2022-05-26 DIAGNOSIS — Z6841 Body Mass Index (BMI) 40.0 and over, adult: Secondary | ICD-10-CM | POA: Diagnosis not present

## 2022-05-26 DIAGNOSIS — L918 Other hypertrophic disorders of the skin: Secondary | ICD-10-CM | POA: Diagnosis not present

## 2022-05-26 DIAGNOSIS — J309 Allergic rhinitis, unspecified: Secondary | ICD-10-CM | POA: Diagnosis not present

## 2022-05-26 DIAGNOSIS — G4733 Obstructive sleep apnea (adult) (pediatric): Secondary | ICD-10-CM | POA: Diagnosis not present

## 2022-05-26 DIAGNOSIS — I10 Essential (primary) hypertension: Secondary | ICD-10-CM | POA: Diagnosis not present

## 2022-05-26 DIAGNOSIS — M545 Low back pain, unspecified: Secondary | ICD-10-CM | POA: Diagnosis not present

## 2022-05-26 DIAGNOSIS — E785 Hyperlipidemia, unspecified: Secondary | ICD-10-CM | POA: Diagnosis not present

## 2022-05-26 DIAGNOSIS — E1165 Type 2 diabetes mellitus with hyperglycemia: Secondary | ICD-10-CM | POA: Diagnosis not present

## 2022-05-27 ENCOUNTER — Ambulatory Visit (INDEPENDENT_AMBULATORY_CARE_PROVIDER_SITE_OTHER): Payer: BC Managed Care – PPO | Admitting: Family Medicine

## 2022-05-27 ENCOUNTER — Other Ambulatory Visit: Payer: Self-pay

## 2022-05-27 ENCOUNTER — Encounter: Payer: Self-pay | Admitting: Family Medicine

## 2022-05-27 VITALS — BP 137/96 | HR 88 | Wt 264.3 lb

## 2022-05-27 DIAGNOSIS — Z3042 Encounter for surveillance of injectable contraceptive: Secondary | ICD-10-CM | POA: Diagnosis not present

## 2022-05-27 DIAGNOSIS — Z01419 Encounter for gynecological examination (general) (routine) without abnormal findings: Secondary | ICD-10-CM | POA: Diagnosis not present

## 2022-05-27 DIAGNOSIS — N938 Other specified abnormal uterine and vaginal bleeding: Secondary | ICD-10-CM

## 2022-05-27 MED ORDER — MEDROXYPROGESTERONE ACETATE 150 MG/ML IM SUSP
150.0000 mg | Freq: Once | INTRAMUSCULAR | Status: AC
  Administered 2022-05-27: 150 mg via INTRAMUSCULAR

## 2022-05-27 NOTE — Assessment & Plan Note (Signed)
Cancer screening: - Pap: up to date - Mammogram: getting one for upcoming breast reduction surgery  - Colonoscopy: not indicated  Contraception: Counseled that most similar option that is not Depo would be Nexplanon. She is not interested in that. Given dose of Depo, will set up for discussion of hysterectomy w Dr. Rip Harbour  Mood: PHQ9=0  Vaccinations: - Flu: declines - HPV: declines for now  Metabolic: - DM: known D3HY, following w weight loss clinic - Lipids: see above  ID: - STI: declines

## 2022-05-27 NOTE — Assessment & Plan Note (Addendum)
Discussed with patient I will refer her to a colleague for discussion of hysterectomy, however advised that she will continue to need to work hard on improving her risk factors, as she is not a good candidate at present with her HTN and DM2.

## 2022-05-27 NOTE — Progress Notes (Signed)
GYNECOLOGY OFFICE VISIT NOTE  History:   Dorothy Haynes is a 36 y.o. G1P0 here today for annual wellness visit.  Wants to talk about depo Goes to a weight loss clinic and her doctor there would like her to come off of it to better help her lose weight She very much likes it, has been on and off of it for many years Uses it both as birth control and as an effective treatment for her dysfunctional uterine bleeding Endorses very heavy periods that last about 10 days requiring pad changes every 2-3 hours Depo has been the only thing effective at controlling them She is interested in getting a hysterectomy Does not want more children She does not smoke Reports her A1c was checked yesterday and was 7.3%   Health Maintenance Due  Topic Date Due   Diabetic kidney evaluation - Urine ACR  Never done   COVID-19 Vaccine (3 - Moderna series) 05/18/2020   FOOT EXAM  07/10/2021   OPHTHALMOLOGY EXAM  10/03/2021   HEMOGLOBIN A1C  02/13/2022    Past Medical History:  Diagnosis Date   Anxiety    Back pain    Diabetes ([place])    Diverticulitis 03/15/2018   Gallbladder problem    GERD (gastroesophageal reflux disease) 2009   Gestational diabetes 2008   Headache    History of gallstones 10/2015   s/p cholecystectomy 10/09/15   Hyperlipidemia    Hypertension    Joint pain    Menorrhagia with irregular cycle 10/23/2016   Morbid obesity ([place]) 10/09/2015   Physical deconditioning 06/22/2019   Plantar fasciitis, bilateral 06/02/2019   Shortness of breath dyspnea    Sleep apnea    TMJ (dislocation of temporomandibular joint), initial encounter 06/02/2019   Tobacco abuse 10/09/2015    Past Surgical History:  Procedure Laterality Date   CHOLECYSTECTOMY N/A 10/09/2015   Procedure: LAPAROSCOPIC CHOLECYSTECTOMY ;  Surgeon: Fanny Skates, MD;  Location: [facility];  Service: General;  Laterality: N/A;   WISDOM TOOTH EXTRACTION      The following portions of the patient's history were reviewed and  updated as appropriate: allergies, current medications, past family history, past medical history, past social history, past surgical history and problem list.   Health Maintenance:   Last pap: Lab Results  Component Value Date   DIAGPAP  03/02/2020    - Negative for intraepithelial lesion or malignancy (NILM)   HPV NOT DETECTED 03/20/2017   [place] Negative 03/02/2020     Last mammogram:  N/a    Review of Systems:  Pertinent items noted in HPI and remainder of comprehensive ROS otherwise negative.  Physical Exam:  BP (!) 137/96   Pulse 88   Wt 264 lb 4.8 oz (119.9 kg)   BMI 46.08 kg/m  CONSTITUTIONAL: Well-developed, well-nourished female in no acute distress.  HEENT:  Normocephalic, atraumatic. External right and left ear normal. No scleral icterus.  NECK: Normal range of motion, supple, no masses noted on observation SKIN: No rash noted. Not diaphoretic. No erythema. No pallor. MUSCULOSKELETAL: Normal range of motion. No edema noted. NEUROLOGIC: Alert and oriented to person, place, and time. Normal muscle tone coordination.  PSYCHIATRIC: Normal mood and affect. Normal behavior. Normal judgment and thought content. RESPIRATORY: Effort normal, no problems with respiration noted   Labs and Imaging No results found for this or any previous visit (from the past 168 hour(s)). No results found.    Assessment and Plan:   Problem List Items Addressed This Visit  Genitourinary   DUB (dysfunctional uterine bleeding)    Discussed with patient I will refer her to a colleague for discussion of hysterectomy, however advised that she will continue to need to work hard on improving her risk factors, as she is not a good candidate at present with her HTN and DM2.         Other   Well woman exam - Primary    Cancer screening: - Pap: up to date - Mammogram: getting one for upcoming breast reduction surgery  - Colonoscopy: not indicated  Contraception: Counseled that most  similar option that is not Depo would be Nexplanon. She is not interested in that. Given dose of Depo, will set up for discussion of hysterectomy w Dr. Alysia Penna  Mood: PHQ9=0  Vaccinations: - Flu: declines - HPV: declines for now  Metabolic: - DM: known T2DM, following w weight loss clinic - Lipids: see above  ID: - STI: declines      Other Visit Diagnoses     Encounter for surveillance of injectable contraceptive       Relevant Medications   medroxyPROGESTERone (DEPO-PROVERA) injection 150 mg       Routine preventative health maintenance measures emphasized. Please refer to After Visit Summary for other counseling recommendations.   Return in about 2 months (around 07/27/2022), or discuss DUB and possible hysterectomy.    Total face-to-face time with patient: 20 minutes.  Over 50% of encounter was spent on counseling and coordination of care.   Venora Maples, MD/MPH Attending Family Medicine Physician, [facility] for [facility], [facility]

## 2022-06-01 ENCOUNTER — Other Ambulatory Visit: Payer: Self-pay | Admitting: Family Medicine

## 2022-06-01 DIAGNOSIS — E119 Type 2 diabetes mellitus without complications: Secondary | ICD-10-CM

## 2022-06-01 DIAGNOSIS — I10 Essential (primary) hypertension: Secondary | ICD-10-CM

## 2022-06-02 DIAGNOSIS — L918 Other hypertrophic disorders of the skin: Secondary | ICD-10-CM | POA: Diagnosis not present

## 2022-06-04 ENCOUNTER — Ambulatory Visit
Admission: RE | Admit: 2022-06-04 | Discharge: 2022-06-04 | Disposition: A | Discharge status: Home / Self Care | Payer: BC Managed Care – PPO | Source: Ambulatory Visit | Attending: Plastic Surgery | Admitting: Plastic Surgery

## 2022-06-04 DIAGNOSIS — N62 Hypertrophy of breast: Secondary | ICD-10-CM

## 2022-06-04 DIAGNOSIS — Z1231 Encounter for screening mammogram for malignant neoplasm of breast: Secondary | ICD-10-CM | POA: Diagnosis not present

## 2022-06-04 DIAGNOSIS — E119 Type 2 diabetes mellitus without complications: Secondary | ICD-10-CM

## 2022-06-09 ENCOUNTER — Emergency Department (HOSPITAL_BASED_OUTPATIENT_CLINIC_OR_DEPARTMENT_OTHER)
Admission: EM | Admit: 2022-06-09 | Discharge: 2022-06-09 | Disposition: A | Discharge status: Home / Self Care | Payer: BC Managed Care – PPO | Attending: Emergency Medicine | Admitting: Emergency Medicine

## 2022-06-09 ENCOUNTER — Other Ambulatory Visit: Payer: Self-pay

## 2022-06-09 ENCOUNTER — Encounter (HOSPITAL_BASED_OUTPATIENT_CLINIC_OR_DEPARTMENT_OTHER): Payer: Self-pay | Admitting: Emergency Medicine

## 2022-06-09 DIAGNOSIS — F17200 Nicotine dependence, unspecified, uncomplicated: Secondary | ICD-10-CM | POA: Diagnosis not present

## 2022-06-09 DIAGNOSIS — I10 Essential (primary) hypertension: Secondary | ICD-10-CM | POA: Insufficient documentation

## 2022-06-09 DIAGNOSIS — Z79899 Other long term (current) drug therapy: Secondary | ICD-10-CM | POA: Diagnosis not present

## 2022-06-09 DIAGNOSIS — E119 Type 2 diabetes mellitus without complications: Secondary | ICD-10-CM | POA: Insufficient documentation

## 2022-06-09 DIAGNOSIS — M549 Dorsalgia, unspecified: Secondary | ICD-10-CM | POA: Diagnosis not present

## 2022-06-09 DIAGNOSIS — M545 Low back pain, unspecified: Secondary | ICD-10-CM | POA: Insufficient documentation

## 2022-06-09 LAB — URINALYSIS, ROUTINE W REFLEX MICROSCOPIC
Bilirubin Urine: NEGATIVE
Glucose, UA: NEGATIVE mg/dL
Hgb urine dipstick: NEGATIVE
Ketones, ur: NEGATIVE mg/dL
Leukocytes,Ua: NEGATIVE
Nitrite: NEGATIVE
Protein, ur: NEGATIVE mg/dL
Specific Gravity, Urine: 1.024 (ref 1.005–1.030)
pH: 5.5 (ref 5.0–8.0)

## 2022-06-09 LAB — PREGNANCY, URINE: Preg Test, Ur: NEGATIVE

## 2022-06-09 MED ORDER — METHOCARBAMOL 500 MG PO TABS
1000.0000 mg | ORAL_TABLET | Freq: Once | ORAL | Status: AC
  Administered 2022-06-09: 1000 mg via ORAL
  Filled 2022-06-09: qty 2

## 2022-06-09 MED ORDER — ACETAMINOPHEN 500 MG PO TABS
1000.0000 mg | ORAL_TABLET | Freq: Four times a day (QID) | ORAL | Status: DC | PRN
  Administered 2022-06-09: 1000 mg via ORAL
  Filled 2022-06-09: qty 2

## 2022-06-09 MED ORDER — KETOROLAC TROMETHAMINE 15 MG/ML IJ SOLN
15.0000 mg | Freq: Once | INTRAMUSCULAR | Status: AC
  Administered 2022-06-09: 15 mg via INTRAMUSCULAR
  Filled 2022-06-09: qty 1

## 2022-06-09 MED ORDER — METHOCARBAMOL 500 MG PO TABS: 1500.0000 mg | ORAL_TABLET | Freq: Three times a day (TID) | ORAL | 0 refills | Status: AC

## 2022-06-09 MED ORDER — METHOCARBAMOL 500 MG PO TABS: 1500.0000 mg | ORAL_TABLET | Freq: Three times a day (TID) | ORAL | 0 refills | Status: DC

## 2022-06-09 NOTE — ED Provider Notes (Signed)
[facility] EMERGENCY DEPT Provider Note   CSN: [id number] Arrival date & time: 06/09/22  1059     History  Chief Complaint  Patient presents with   Back Pain    Dorothy Haynes is a 36 y.o. female with T2DM, OSA, HLD, GERD, tobacco abuse, HTN, obesity presents with back pain.   Patient c/o acute on chronic back pain that worsened on 10/5. Located right of lumbar spine, midline and also on the left but worst on the right. Radiating down. Started on Thursday. Patient has had no trauma and was sleeping when the pain started. Has been trying ibuprofen, biofreeze, and horse liniment which hasn't helped. Has had this back pain before but not this severe. Denies f/c, headache, chest pain, SOB, N/V, abd pain, D/C, urinary/vaginal symptoms, bowel/bladder incontinence, numbness/tingling, asymmetric weakness. Stated that she used a pillow yesterday in a way behind her back that helped somewhat. States she has a history of diverticulitis several years ago and associates the pain on her left back with "irritating the diverticulitis." Similar to pain when she "eats something she shouldn't have."  No h/o malignancy, no h/o IVDU.    Back Pain      Home Medications Prior to Admission medications   Medication Sig Start Date End Date Taking? Authorizing Provider  calcium-vitamin D (OSCAL WITH D) 500-200 MG-UNIT tablet Take 1 tablet by mouth 2 (two) times daily. 10/14/19   Marny Lowenstein, PA-C  cetirizine (ZYRTEC) 10 MG tablet Take 1 tablet by mouth once daily 02/21/21   Shirlean Mylar, MD  ferrous sulfate 325 (65 FE) MG tablet Take 1 tablet (325 mg total) by mouth daily with breakfast. 10/29/21   Jaci Standard, MD  ibuprofen (ADVIL) 800 MG tablet Take 800 mg by mouth 3 (three) times daily as needed. 03/31/22   [provider]  losartan (COZAAR) 25 MG tablet TAKE 1 TABLET BY MOUTH AT BEDTIME 08/30/21   McDiarmid, Leighton Roach, MD  potassium chloride (KLOR-CON M) 10 MEQ tablet Take  1-2 tablets (10-20 mEq total) by mouth at bedtime. 11/19/21   Helane Rima, DO  rosuvastatin (CRESTOR) 10 MG tablet Take 10 mg by mouth at bedtime. 05/24/21   [provider]  tirzepatide Greggory Keen) 10 MG/0.5ML Pen Inject 10 mg into the skin once a week. 10/15/21   Helane Rima, DO  VENTOLIN HFA 108 (90 Base) MCG/ACT inhaler Inhale into the lungs. 07/30/21   [provider]      Allergies    Patient has no known allergies.    Review of Systems   Review of Systems  Musculoskeletal:  Positive for back pain.   Review of systems negative for f/c.  A 10 point review of systems was performed and is negative unless otherwise reported in HPI.  Physical Exam Updated Vital Signs BP 109/72 (BP Location: Right Arm)   Pulse 74   Temp 98.3 F (36.8 C) (Oral)   Resp 16   Ht 5\' 3"  (1.6 m)   Wt 119.9 kg   SpO2 99%   BMI 46.82 kg/m  Physical Exam General: Normal appearing, well-appearing female, lying in bed.  HEENT:  Sclera anicteric, MMM, trachea midline. Cardiology: RRR, no murmurs/rubs/gallops. BL radial and DP pulses equal bilaterally.  Resp: Normal respiratory rate and effort. CTAB, no wheezes, rhonchi, crackles.  Abd: Soft, non-tender, non-distended. No rebound tenderness or guarding.  GU: Deferred. MSK: No peripheral edema or signs of trauma. Extremities without deformity or TTP. No cyanosis or clubbing. Skin: warm,  dry. No rashes or lesions. Back: No CVA tenderness. R and L lower back pain tender to palpation of paraspinal musculature. Negative straight leg raise bilaterally. No palpable step-offs or deformities. No midline tenderness to palpation. Neuro: A&Ox4, CNs II-XII grossly intact. MAEs. Sensation grossly intact.  Psych: Normal mood and affect.   ED Results / Procedures / Treatments   Labs (all labs ordered are listed, but only abnormal results are displayed) Labs Reviewed  URINALYSIS, ROUTINE W REFLEX MICROSCOPIC - Abnormal; Notable for the following  components:      Result Value   APPearance HAZY (*)    All other components within normal limits  PREGNANCY, URINE   Procedures Procedures    Medications Ordered in ED Medications  ketorolac (TORADOL) 15 MG/ML injection 15 mg (15 mg Intramuscular Given 06/09/22 1218)  methocarbamol (ROBAXIN) tablet 1,000 mg (1,000 mg Oral Given 06/09/22 1600)    ED Course/ Medical Decision Making/ A&P                          Medical Decision Making Amount and/or Complexity of Data Reviewed Labs: ordered.  Risk OTC drugs. Prescription drug management.   Patient is overall very well-appearing, vitally stable, afebrile.  Differential diagnoses for this well-appearing patient with LBP includes lumbago versus musculoskeletal spasm / strain versus sciatica with radiating pain. No back pain red flags on history or physical. Presentation not consistent with malignancy (lack of history of malignancy, lack of B symptoms), fracture (no trauma, no bony tenderness to palpation), cauda equina (no bowel or urinary incontinence/retention, no saddle anesthesia, no distal weakness), nephrolithiasis/pyelonephritis (afebrile, no CVAT, no dysuria/hematuria). Patient's pain primarily is in her back, with no abdominal tenderness to palpation.  Very low concern for acute diverticulitis causing her symptoms, given no report of abdominal pain and abdominal exam very benign. Will obtain urine and urine pregnancy. Will treat with tylenol ibuprofen and reevaluate.    I have personally reviewed and interpreted all labs.   Clinical Course as of 06/20/22 1538  Mon Jun 09, 2022  1514 Pain was unrelieved by tylenol/ibuprofen. She reports ongoing pain in her entire lower back up both sides that is a 6/10 at rest and worse with movement. Discussed with patient that imaging in the absence of trauma is not likely to reveal anything, and patient has negative straight leg raise for radiculopathy. Offered patient robaxin to help treat  her symptoms and advised rest, ice, heat, tylenol/ibuprofen, and robaxin. Patient is amenable to this plan and will f/u with PCP. Patient is given extensive discharge instructions/return precautions.   [HN]    Clinical Course User Index [HN] Audley Hose, MD          Final Clinical Impression(s) / ED Diagnoses Final diagnoses:  Bilateral low back pain without sciatica, unspecified chronicity    Rx / DC Orders ED Discharge Orders          Ordered    methocarbamol (ROBAXIN) 500 MG tablet  3 times daily,   Status:  Discontinued        06/09/22 1602    methocarbamol (ROBAXIN) 500 MG tablet  3 times daily        06/09/22 1602             This note was created using dictation software, which may contain spelling or grammatical errors.    Audley Hose, MD 06/20/22 1539

## 2022-06-09 NOTE — ED Notes (Signed)
Discharge paperwork given and verbally understood. 

## 2022-06-09 NOTE — Discharge Instructions (Addendum)
Thank you for coming to [facility] Emergency Department. You were seen for back pain. We did an exam, labs, and these showed no acute findings. We have prescribed robaxin for your musculoskeletal pain. You can take this 1,5000 mg three times per day for 3 days for pain. You can also alternate taking  tylenol and ibuprofen. Rest, and ice or heat the area as well.  Please follow up with your primary care provider within 1 week.   Return to the ED if you develop any of the following: - Fever (100.4 F or 38 C) or chills at home that do not respond to over the counter medications - Weakness, numbness, or tingling in your extremities - Difficulty emptying bladder / urinary incontinence - Fecal incontinence - Uncontrolled nausea/vomiting with inability to keep down liquids - Feeling as though you are going to pass out or passing out - Anything else that concerns you

## 2022-06-09 NOTE — ED Triage Notes (Signed)
Pt arrives to ED with c/o acute on chronic back pain that worsened on 10/5. Pain is lower right sided.

## 2022-06-10 DIAGNOSIS — M545 Low back pain, unspecified: Secondary | ICD-10-CM | POA: Diagnosis not present

## 2022-06-10 DIAGNOSIS — Z7689 Persons encountering health services in other specified circumstances: Secondary | ICD-10-CM | POA: Diagnosis not present

## 2022-06-11 ENCOUNTER — Other Ambulatory Visit: Payer: Self-pay | Admitting: Family Medicine

## 2022-06-11 ENCOUNTER — Ambulatory Visit
Admission: RE | Admit: 2022-06-11 | Discharge: 2022-06-11 | Disposition: A | Discharge status: Home / Self Care | Payer: BC Managed Care – PPO | Source: Ambulatory Visit | Attending: Family Medicine | Admitting: Family Medicine

## 2022-06-11 DIAGNOSIS — M545 Low back pain, unspecified: Secondary | ICD-10-CM | POA: Diagnosis not present

## 2022-06-12 DIAGNOSIS — E1169 Type 2 diabetes mellitus with other specified complication: Secondary | ICD-10-CM | POA: Diagnosis not present

## 2022-06-12 DIAGNOSIS — M62830 Muscle spasm of back: Secondary | ICD-10-CM | POA: Diagnosis not present

## 2022-06-12 DIAGNOSIS — N62 Hypertrophy of breast: Secondary | ICD-10-CM | POA: Diagnosis not present

## 2022-06-12 DIAGNOSIS — Z79899 Other long term (current) drug therapy: Secondary | ICD-10-CM | POA: Diagnosis not present

## 2022-06-12 DIAGNOSIS — Z6841 Body Mass Index (BMI) 40.0 and over, adult: Secondary | ICD-10-CM | POA: Diagnosis not present

## 2022-06-16 DIAGNOSIS — M9905 Segmental and somatic dysfunction of pelvic region: Secondary | ICD-10-CM | POA: Diagnosis not present

## 2022-06-16 DIAGNOSIS — M5116 Intervertebral disc disorders with radiculopathy, lumbar region: Secondary | ICD-10-CM | POA: Diagnosis not present

## 2022-06-16 DIAGNOSIS — M9903 Segmental and somatic dysfunction of lumbar region: Secondary | ICD-10-CM | POA: Diagnosis not present

## 2022-06-16 DIAGNOSIS — M25552 Pain in left hip: Secondary | ICD-10-CM | POA: Diagnosis not present

## 2022-07-03 DIAGNOSIS — M62830 Muscle spasm of back: Secondary | ICD-10-CM | POA: Diagnosis not present

## 2022-07-08 ENCOUNTER — Telehealth: Payer: Self-pay | Admitting: *Deleted

## 2022-07-08 NOTE — Telephone Encounter (Signed)
Auth submitted to [place]  / [facility] MCD for 2 units [place]:   [employer] :

## 2022-07-09 ENCOUNTER — Encounter: Payer: Self-pay | Admitting: Surgery

## 2022-07-09 ENCOUNTER — Ambulatory Visit (INDEPENDENT_AMBULATORY_CARE_PROVIDER_SITE_OTHER): Payer: BC Managed Care – PPO | Admitting: Surgery

## 2022-07-09 VITALS — BP 125/90 | HR 87 | Ht 63.0 in | Wt 264.3 lb

## 2022-07-09 DIAGNOSIS — M533 Sacrococcygeal disorders, not elsewhere classified: Secondary | ICD-10-CM | POA: Diagnosis not present

## 2022-07-09 DIAGNOSIS — Q7649 Other congenital malformations of spine, not associated with scoliosis: Secondary | ICD-10-CM

## 2022-07-09 DIAGNOSIS — G8929 Other chronic pain: Secondary | ICD-10-CM

## 2022-07-09 DIAGNOSIS — M5416 Radiculopathy, lumbar region: Secondary | ICD-10-CM | POA: Diagnosis not present

## 2022-07-09 DIAGNOSIS — M7061 Trochanteric bursitis, right hip: Secondary | ICD-10-CM

## 2022-07-09 MED ORDER — LIDOCAINE HCL 1 % IJ SOLN
3.0000 mL | INTRAMUSCULAR | Status: AC | PRN
  Administered 2022-07-09: 3 mL

## 2022-07-09 MED ORDER — KETOROLAC TROMETHAMINE 30 MG/ML IJ SOLN: 30.0000 mg | Freq: Once | INTRAMUSCULAR | Status: AC

## 2022-07-09 MED ORDER — BUPIVACAINE HCL 0.25 % IJ SOLN
6.0000 mL | INTRAMUSCULAR | Status: AC | PRN
  Administered 2022-07-09: 6 mL via INTRA_ARTICULAR

## 2022-07-09 MED ORDER — METHYLPREDNISOLONE ACETATE 40 MG/ML IJ SUSP
40.0000 mg | INTRAMUSCULAR | Status: AC | PRN
  Administered 2022-07-09: 40 mg via INTRA_ARTICULAR

## 2022-07-09 NOTE — Progress Notes (Signed)
Office Visit Note   Patient: Dorothy Haynes           Date of Birth: 08/06/1986           MRN: [id number] Visit Date: 07/09/2022              Requested by: Harvest Forest, MD [address] [place],  [place] [address] PCP: Harvest Forest, MD   Assessment & Plan: Visit Diagnoses:  1. Radiculopathy, lumbar region   2. Greater trochanteric bursitis of right hip   3. Chronic bilateral SI joint pain   4. Congenital fusion of lumbosacral spine     Plan: In hopes of giving patient some relief of her right lateral hip pain offered injection.  After patient consent right lateral hip was prepped with Betadine and greater trochanter bursa Marcaine/Depo-Medrol injection was performed.  Sitting for few minutes she reported excellent relief of her hip pain and her gait also greatly improved with anesthetic in place.  Patient also given Toradol 30 mg IM injection today in the clinic.  I will send patient to formal PT for a few weeks to work on all areas addressed today.  We will also instruct home exercise program.  She did state that she believes her mother is followed by rheumatologist for rheumatoid arthritis.  I may consider getting blood work to check a CBC and arthritis panel when she returns.  Follow-Up Instructions: Return in about 3 weeks (around 07/30/2022) for With Fayrene Fearing for recheck.   Orders:  Orders Placed This Encounter  Procedures   Ambulatory referral to Physical Therapy   Meds ordered this encounter  Medications   ketorolac (TORADOL) 30 MG/ML injection 30 mg      Procedures: Large Joint Inj: R greater trochanter on 07/09/2022 10:02 AM Indications: pain Details: 22 G 3.5 in needle, lateral approach Medications: 3 mL lidocaine 1 %; 6 mL bupivacaine 0.25 %; 40 mg methylPREDNISolone acetate 40 MG/ML  After sitting for a few minutes patient reported excellent relief of her lateral hip and upper leg pain with anesthetic in place.  Gait greatly  improved.      Clinical Data: No additional findings.   Subjective: Chief Complaint  Patient presents with   Lower Back - Pain    HPI 36 year old black female comes in today with complaints of low back pain and right leg pain.  I reviewed patient's chart and she has previously seen Dr. August Saucer for low back issues in 2016.  He ordered an MRI scan which was done February 15, 2015 and that showed:  EXAM: MRI LUMBAR SPINE WITHOUT CONTRAST   TECHNIQUE: Multiplanar, multisequence MR imaging of the lumbar spine was performed. No intravenous contrast was administered.   COMPARISON:  Radiographs dated 05/19/2009 and chest CT scan dated 03/09/2007   FINDINGS: Tip of the conus is at T12-L1. There is a tiny lipoma adjacent to the tip of the lipoma to the right of midline. Normal paraspinal soft tissues.   T12-L1 through L3-4:  Normal.   L4-5: Small central soft disc protrusion with an annular fissure. This touches the ventral aspect of the thecal sac but there is no neural impingement. Slight degenerative changes of the facet joints.   L5-S1: The L5 vertebra is sacralized with solid congenital fusion on the right with a vestigial disc. No neural impingement.   IMPRESSION: 1. Small central soft disc protrusion at L4-5 with no neural impingement. Slight degenerative changes of both facet joints. 2. Congenital fusion  of L5 to the sacrum. 3. Small lipoma adjacent to the tip of the conus at T12.     Electronically Signed   By: Francene Boyers M.D.   On: 02/15/2015 10:45  States that she has had chronic back pain for several years.  Normally she just deals with it and treats it conservatively.  About 3 weeks ago she been having increased low back pain with some radiation of pain numbness and tingling into her right calf.  No left-sided symptoms.  Also having pain in the right lateral hip.  All areas are aggravated with ambulation.  She did go to the ED June 09, 2022 and was given a  Toradol injection and muscle relaxer.  She has not had any improvement.  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  She has not missed any days of work.  Review of Systems No current cardiopulmonary GI/GU issues  Objective: Vital Signs: BP (!) 125/90   Pulse 87   Ht 5\' 3"  (1.6 m)   Wt 264 lb 4.8 oz (119.9 kg)   BMI 46.82 kg/m   Physical Exam Constitutional:      Appearance: She is obese.  HENT:     Head: Normocephalic and atraumatic.  Eyes:     Extraocular Movements: Extraocular movements intact.  Pulmonary:     Effort: No respiratory distress.  Musculoskeletal:     Comments: Patient does have a slight Trendelenburg gait.  Mild right-sided lumbar paraspinal tenderness.  Moderate tenderness over the bilateral SI joints.  Minimally tender over the right sciatic notch.  Moderate to marked tenderness over the right hip greater trochanter bursa.  Negative on the left side.  Negative log roll bilateral hips.  Negative straight leg raise.  No focal motor deficits.  Neurological:     Mental Status: She is alert and oriented to person, place, and time.  Psychiatric:        Mood and Affect: Mood normal.     Ortho Exam  Specialty Comments:  No specialty comments available.  Imaging: No results found.   PMFS History: Patient Active Problem List   Diagnosis Date Noted   Well woman exam 05/27/2022   DUB (dysfunctional uterine bleeding) 05/27/2022   Symptomatic mammary hypertrophy 04/22/2022   Retinal edema 08/16/2021   Hypertensive retinopathy of both eyes 08/16/2021   Combined forms of age-related cataract of both eyes 08/16/2021   Leukocytosis, chronic 08/16/2021   Thrombocytosis, chronic 08/16/2021   Encounter for Depo-Provera contraception 04/29/2021   GERD (gastroesophageal reflux disease) 05/15/2020   Hyperlipidemia associated with type 2 diabetes mellitus (HCC) 07/13/2019   Type 2 diabetes mellitus without complication, without long-term current  use of insulin (HCC) 03/22/2018   OSA (obstructive sleep apnea) 03/22/2018   Hypertension associated with diabetes (HCC) 02/08/2018   Morbid obesity (HCC) 10/09/2015   Tobacco abuse 10/09/2015   Past Medical History:  Diagnosis Date   Anxiety    Back pain    Diabetes (HCC)    Diverticulitis 03/15/2018   Gallbladder problem    GERD (gastroesophageal reflux disease) 2009   Gestational diabetes 2008   Headache    History of gallstones 10/2015   s/p cholecystectomy 10/09/15   Hyperlipidemia    Hypertension    Joint pain    Menorrhagia with irregular cycle 10/23/2016   Morbid obesity (HCC) 10/09/2015   Physical deconditioning 06/22/2019   Plantar fasciitis, bilateral 06/02/2019   Shortness of breath dyspnea    Sleep apnea    TMJ (  dislocation of temporomandibular joint), initial encounter 06/02/2019   Tobacco abuse 10/09/2015    Family History  Problem Relation Age of Onset   Drug abuse Mother    Heart disease Mother    Diabetes Mother    Hypertension Mother    Cataracts Mother    Cancer Father    Throat cancer Father    Kidney Stones Father    Alcoholism Father    Drug abuse Father    Diabetes Maternal Grandmother    Diabetes Maternal Grandfather    Blindness Maternal Aunt    Alzheimer's disease Other    Diabetes Other    Hypertension Other    Heart disease Other    Heart disease Other    Diabetes Other    Hypertension Other    Thyroid disease Other    Amblyopia Neg Hx    Glaucoma Neg Hx    Macular degeneration Neg Hx    Retinal detachment Neg Hx    Strabismus Neg Hx    Retinitis pigmentosa Neg Hx     Past Surgical History:  Procedure Laterality Date   CHOLECYSTECTOMY N/A 10/09/2015   Procedure: LAPAROSCOPIC CHOLECYSTECTOMY ;  Surgeon: Claud Kelp, MD;  Location: [facility];  Service: General;  Laterality: N/A;   WISDOM TOOTH EXTRACTION     Social History   Occupational History    Employer: [employer]    Comment: part-time   Occupation: Dispensing optician  Tobacco Use   Smoking status: Former    Packs/day: 0.20    Years: 17.00    Total pack years: 3.40    Types: Cigarettes    Quit date: 03/01/2018    Years since quitting: 4.3   Smokeless tobacco: Never  Vaping Use   Vaping Use: Never used  Substance and Sexual Activity   Alcohol use: Yes    Comment: occ, 1-2 drink per week   Drug use: No    Comment: previously used MJ, has been off recreational drugs   Sexual activity: Yes    Partners: Male    Birth control/protection: Injection

## 2022-07-14 ENCOUNTER — Ambulatory Visit (INDEPENDENT_AMBULATORY_CARE_PROVIDER_SITE_OTHER): Payer: BC Managed Care – PPO | Admitting: Obstetrics and Gynecology

## 2022-07-14 ENCOUNTER — Encounter: Payer: Self-pay | Admitting: Obstetrics and Gynecology

## 2022-07-14 VITALS — BP 121/99 | HR 98 | Wt 257.0 lb

## 2022-07-14 DIAGNOSIS — N938 Other specified abnormal uterine and vaginal bleeding: Secondary | ICD-10-CM

## 2022-07-14 NOTE — Progress Notes (Signed)
Ms Graw presents for discussion of Tx options for her [facility]. Currently on Depo Provera but has had excessive wt gain with it. Does not desire to stay on Depo Provera. Has tried IUD in the past but had to be removed due to malposition Has completed childbearing Pap smear UTP H/O TSVD x 1 ( 7 #) H/O DM  PE AF VSS Chaperone present  Lungs clear Heart RRR Abd soft + BS GU Nl EGBUS, uterus small, mobile, < 8 weeks, no masses  A/P [facility] Desires definite therpay. TVH reviewed with pt. R/B/Post op care discussed. Information provided. Will check GYN U/S. Medicaid papers signed Will schedule once GYN U/S completed. Continue with Depo Provera in the mean time.

## 2022-07-14 NOTE — Patient Instructions (Signed)
Vaginal Hysterectomy, Care After The following information offers guidance on how to care for yourself after your procedure. Your health care provider may also give you more specific instructions. If you have problems or questions, contact your health care provider. What can I expect after the procedure? After the procedure, it is common to have: Pain in the lower abdomen and vagina. Vaginal bleeding and discharge for up to 1 week. You will need to use a sanitary pad after this procedure. Difficulty having a bowel movement (constipation). Temporary problems emptying the bladder. Tiredness (fatigue). Poor appetite. Less interest in sex. Feelings of sadness or other emotions. If your ovaries were also removed, it is also common to have symptoms of menopause, such as hot flashes, night sweats, and lack of sleep (insomnia). Follow these instructions at home: Medicines  Take over-the-counter and prescription medicines only as told by your health care provider. Ask your health care provider if the medicine prescribed to you: Requires you to avoid driving or using machinery. Can cause constipation. You may need to take these actions to prevent or treat constipation: Drink enough fluid to keep your urine pale yellow. Take over-the-counter or prescription medicines. Eat foods that are high in fiber, such as beans, whole grains, and fresh fruits and vegetables. Limit foods that are high in fat and processed sugars, such as fried or sweet foods. Activity  Rest as told by your health care provider. Return to your normal activities as told by your health care provider. Ask your health care provider what activities are safe for you Avoid sitting for a long time without moving. Get up to take short walks every 1-2 hours. This is important to improve blood flow and breathing. Ask for help if you feel weak or unsteady. Try to have someone home with you for 1-2 weeks to help you with everyday chores. Do  not lift anything that is heavier than 10 lb (4.5 kg), or the limit that you are told, until your health care provider says that it is safe. If you were given a sedative during the procedure, it can affect you for several hours. Do not drive or operate machinery until your health care provider says that it is safe. Lifestyle Do not use any products that contain nicotine or tobacco. These products include cigarettes, chewing tobacco, and vaping devices, such as e-cigarettes. These can delay healing after surgery. If you need help quitting, ask your health care provider. Do not drink alcohol until your health care provider approves. General instructions Do not douche, use tampons, or have sex for at least 6 weeks, or as told by your health care provider. If you struggle with physical or emotional changes after your procedure, speak with your health care provider or a therapist. The stitches inside your vagina will dissolve over time and do not need to be taken out. Do not take baths, swim, or use a hot tub until your health care provider approves. You may only be allowed to take showers for 2-3 weeks Wear compression stockings as told by your health care provider. These stockings help to prevent blood clots and reduce swelling in your legs. Keep all follow-up visits. This is important. Contact a health care provider if: Your pain medicine is not helping. You have a fever. You have nausea or vomiting that does not go away. You feel dizzy. You have blood, pus, or a bad-smelling discharge from your vagina more than 1 week after the procedure. You continue to have trouble urinating 3-5   days after the procedure. Get help right away if: You have severe pain in your abdomen or back. You faint. You have heavy vaginal bleeding and blood clots, soaking through a sanitary pad in less than 1 hour. You have chest pain or shortness of breath. You have pain, swelling, or redness in your leg. These symptoms  may represent a serious problem that is an emergency. Do not wait to see if the symptoms will go away. Get medical help right away. Call your local emergency services (911 in the [place] Do not drive yourself to the hospital. Summary After the procedure, it is common to have pain, vaginal bleeding, constipation, temporary problems emptying your bladder, and feelings of sadness or other emotions. Take over-the-counter and prescription medicines only as told by your health care provider. Rest as told by your health care provider. Return to your normal activities as told by your health care provider. Contact a health care provider if your pain medicine is not helping, or you have a fever, dizziness, or trouble urinating several days after the procedure. Get help right away if you have severe pain in your abdomen or back, or if you faint, have heavy bleeding, or have chest pain or shortness of breath. This information is not intended to replace advice given to you by your health care provider. Make sure you discuss any questions you have with your health care provider. Document Revised: 10/30/2021 Document Reviewed: 04/20/2020 Elsevier Patient Education  2023 [employer] Vaginal Hysterectomy  A vaginal hysterectomy is a procedure to remove all or part of the uterus through a small incision in the vagina. In this procedure, your health care provider may remove your entire uterus, including the cervix. The cervix is the opening and bottom part of the uterus and is located between the vagina and the uterus. Sometimes, the ovaries and fallopian tubes are also removed. This surgery may be done to treat problems such as: Noncancerous growths in the uterus (uterine fibroids) that cause symptoms. A condition that causes the lining of the uterus to grow in other areas (endometriosis). Problems with pelvic support. Cancer of the cervix, ovaries, uterus, or tissue that lines the uterus (endometrium). Excessive  bleeding in the uterus. When removing your uterus, your health care provider may also remove the ovaries and the fallopian tubes. After this procedure, you will no longer be able to have a baby, and you will no longer have a menstrual period. Tell a health care provider about: Any allergies you have. All medicines you are taking, including vitamins, herbs, eye drops, creams, and over-the-counter medicines. Any problems you or family members have had with anesthetic medicines. Any blood disorders you have. Any surgeries you have had. Any medical conditions you have. Whether you are pregnant or may be pregnant. What are the risks? Generally, this is a safe procedure. However, problems may occur, including: Bleeding. Infection. Blood clots in the legs or lungs. Damage to nearby structures or organs. Pain during sex. Allergic reactions to medicines. What happens before the procedure? Staying hydrated Follow instructions from your health care provider about hydration, which may include: Up to 2 hours before the procedure - you may continue to drink clear liquids, such as water, clear fruit juice, black coffee, and plain tea.  Eating and drinking restrictions Follow instructions from your health care provider about eating and drinking, which may include: 8 hours before the procedure - stop eating heavy meals or foods, such as meat, fried foods, or fatty foods. 6 hours   before the procedure - stop eating light meals or foods, such as toast or cereal. 6 hours before the procedure - stop drinking milk or drinks that contain milk. 2 hours before the procedure - stop drinking clear liquids. Medicines Take over-the-counter and prescription medicines only as told by your health care provider. You may be asked to take a medicine to empty your colon (bowel preparation). General instructions If you were asked to do a bowel preparation before the procedure, follow instructions from your health care  provider. This procedure can affect the way you feel about yourself. Talk with your health care provider about the physical and emotional changes hysterectomy may cause. Do not use any products that contain nicotine or tobacco for at least 4 weeks before the procedure. These products include cigarettes, e-cigarettes, and chewing tobacco. If you need help quitting, ask your health care provider. Plan to have a responsible adult take you home from the hospital or clinic. Plan to have a responsible adult care for you for the time you are told after you leave the hospital or clinic. This is important. Surgery safety Ask your health care provider: How your surgery site will be marked. What steps will be taken to help prevent infection. These may include: Removing hair at the surgery site. Washing skin with a germ-killing soap. Receiving antibiotic medicine. What happens during the procedure? An IV will be inserted into one of your veins. You will be given one or more of the following: A medicine to help you relax (sedative). A medicine to numb the area (local anesthetic). A medicine to make you fall asleep (general anesthetic). A medicine that is injected into your spine to numb the area below and slightly above the injection site (spinal anesthetic). A medicine that is injected into an area of your body to numb everything below the injection site (regional anesthetic). Your surgeon will make an incision in your vagina. Your surgeon will locate and remove all or part of your uterus. Part or all of the uterus will be removed through the vagina. Your ovaries and fallopian tubes may be removed at the same time. The incision in your vagina will be closed with stitches (sutures) that dissolve over time. The procedure may vary among health care providers and hospitals. What happens after the procedure? Your blood pressure, heart rate, breathing rate, and blood oxygen level will be monitored until you  leave the hospital or clinic. You will be encouraged to walk as soon as possible. You will also use a device or do breathing exercises to keep your lungs clear. You may have to wear compression stockings. These stockings help to prevent blood clots and reduce swelling in your legs. You will be given pain medicine as needed. You will need to wear a sanitary pad for vaginal discharge or bleeding. Summary A vaginal hysterectomy is a procedure to remove all or part of the uterus through the vagina. You may need a vaginal hysterectomy to treat a variety of abnormalities of the uterus. Plan to have a responsible adult take you home from the hospital or clinic. Plan to have a responsible adult care for you for the time you are told after you leave the hospital or clinic. This is important. This information is not intended to replace advice given to you by your health care provider. Make sure you discuss any questions you have with your health care provider. Document Revised: 10/30/2021 Document Reviewed: 04/20/2020 Elsevier Patient Education  2023 [employer] Inc.  

## 2022-07-15 ENCOUNTER — Ambulatory Visit: Payer: BC Managed Care – PPO | Admitting: Surgery

## 2022-07-17 ENCOUNTER — Ambulatory Visit: Payer: BC Managed Care – PPO | Attending: Surgery

## 2022-07-17 ENCOUNTER — Encounter: Payer: Self-pay | Admitting: *Deleted

## 2022-07-17 ENCOUNTER — Other Ambulatory Visit: Payer: Self-pay

## 2022-07-17 DIAGNOSIS — G8929 Other chronic pain: Secondary | ICD-10-CM | POA: Diagnosis not present

## 2022-07-17 DIAGNOSIS — Q7649 Other congenital malformations of spine, not associated with scoliosis: Secondary | ICD-10-CM | POA: Insufficient documentation

## 2022-07-17 DIAGNOSIS — M5416 Radiculopathy, lumbar region: Secondary | ICD-10-CM | POA: Insufficient documentation

## 2022-07-17 DIAGNOSIS — M7061 Trochanteric bursitis, right hip: Secondary | ICD-10-CM | POA: Insufficient documentation

## 2022-07-17 DIAGNOSIS — M533 Sacrococcygeal disorders, not elsewhere classified: Secondary | ICD-10-CM | POA: Insufficient documentation

## 2022-07-17 DIAGNOSIS — M6281 Muscle weakness (generalized): Secondary | ICD-10-CM | POA: Insufficient documentation

## 2022-07-17 DIAGNOSIS — M5441 Lumbago with sciatica, right side: Secondary | ICD-10-CM | POA: Insufficient documentation

## 2022-07-17 NOTE — Therapy (Signed)
OUTPATIENT PHYSICAL THERAPY THORACOLUMBAR EVALUATION   Patient Name: Dorothy Haynes MRN: [id number] DOB:27-Jun-1986, 36 y.o., female Today's Date: 07/17/2022  END OF SESSION:  PT End of Session - 07/17/22 0913     Visit Number 2    Number of Visits 13    Date for PT Re-Evaluation 12/08/18    Authorization Type [employer]BS COMM PPO; [place] MEDICAID [employer]    Progress Note Due on Visit 10    PT Start Time 0720    PT Stop Time 0805    PT Time Calculation (min) 45 min    Activity Tolerance Patient tolerated treatment well;No increased pain    Behavior During Therapy [facility] for tasks assessed/performed             Past Medical History:  Diagnosis Date   Anxiety    Back pain    Diabetes (HCC)    Diverticulitis 03/15/2018   Gallbladder problem    GERD (gastroesophageal reflux disease) 2009   Gestational diabetes 2008   Headache    History of gallstones 10/2015   s/p cholecystectomy 10/09/15   Hyperlipidemia    Hypertension    Joint pain    Menorrhagia with irregular cycle 10/23/2016   Morbid obesity (HCC) 10/09/2015   Physical deconditioning 06/22/2019   Plantar fasciitis, bilateral 06/02/2019   Shortness of breath dyspnea    Sleep apnea    TMJ (dislocation of temporomandibular joint), initial encounter 06/02/2019   Tobacco abuse 10/09/2015   Past Surgical History:  Procedure Laterality Date   CHOLECYSTECTOMY N/A 10/09/2015   Procedure: LAPAROSCOPIC CHOLECYSTECTOMY ;  Surgeon: Claud Kelp, MD;  Location: [facility];  Service: General;  Laterality: N/A;   WISDOM TOOTH EXTRACTION     Patient Active Problem List   Diagnosis Date Noted   Well woman exam 05/27/2022   DUB (dysfunctional uterine bleeding) 05/27/2022   Symptomatic mammary hypertrophy 04/22/2022   Retinal edema 08/16/2021   Hypertensive retinopathy of both eyes 08/16/2021   Combined forms of age-related cataract of both eyes 08/16/2021   Leukocytosis, chronic 08/16/2021   Thrombocytosis, chronic  08/16/2021   Encounter for Depo-Provera contraception 04/29/2021   GERD (gastroesophageal reflux disease) 05/15/2020   Hyperlipidemia associated with type 2 diabetes mellitus (HCC) 07/13/2019   Type 2 diabetes mellitus without complication, without long-term current use of insulin (HCC) 03/22/2018   OSA (obstructive sleep apnea) 03/22/2018   Hypertension associated with diabetes (HCC) 02/08/2018   Morbid obesity (HCC) 10/09/2015   Tobacco abuse 10/09/2015    PCP: [place], MD REFERRING PROVIDER: Naida Sleight, PA-C   REFERRING DIAG: M54.16 (ICD-10-CM) - Radiculopathy, lumbar region;M70.61 (ICD-10-CM) - Greater trochanteric bursitis of right hip; M53.3,G89.29 (ICD-10-CM) - Chronic SI joint pain; Q76.49 (ICD-10-CM) - Congenital fusion of lumbosacral spine  Rationale for Evaluation and Treatment: Rehabilitation  THERAPY DIAG:  Chronic bilateral low back pain with right-sided sciatica  Muscle weakness (generalized)  ONSET DATE: Chronic with a recent acute onset 1 minth ago  SUBJECTIVE:  SUBJECTIVE STATEMENT: Pt reports 1 month ago an increase in her low back pain s a specific MOI. Pt notes this was the worst episode of low back pain she has had. At that time the pt reports when she walked forward, it was worse, but when she walked backward the pain decrease. The back pain has now resolved, but now she has pain and N/T down her whole R leg to her toes.  PERTINENT HISTORY:  High BMI, DM, anxiety, HTN  PAIN:  Are you having pain? Yes: NPRS scale: 10/10 Pain location: Whole R leg Pain description: Tooth ache Aggravating factors: Sitting Relieving factors: Walking , sleeping on stomach  PRECAUTIONS: None  WEIGHT BEARING RESTRICTIONS: No  FALLS:  Has patient fallen in last 6 months?  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 Alignment is normal. Intervertebral disc spaces are maintained. Cholecystectomy clips are present.   IMPRESSION: Negative.  PATIENT SURVEYS:  FOTO: Perceived function   49%, predicted   65%   SCREENING FOR RED FLAGS: Bowel or bladder incontinence: No Spinal tumors: No Cauda equina syndrome: No Compression fracture: No  COGNITION: Overall cognitive status: Within functional limits for tasks assessed     SENSATION: WFL  MUSCLE LENGTH: Hamstrings: Right WNLs deg; Left WNLs deg Maisie Fus test: Right Tight deg; Left Tight deg  POSTURE: rounded shoulders, forward head, increased lumbar lordosis, decreased thoracic kyphosis, anterior pelvic tilt, and weight shift left  PALPATION: TTP R PSIS. No tenderness to the lumbar paraspinals  LUMBAR ROM:   AROM eval  Flexion Mod limitation* s lordosis being reversed  Extension Min limitation*  Right lateral flexion Min limitation*  Left lateral flexion Min limitation  Right rotation Min limitation*  Left rotation Min limitation   (Blank rows = not tested)  LOWER EXTREMITY ROM:      WNLs Active  Right eval Left eval  Hip flexion    Hip extension    Hip abduction    Hip adduction    Hip internal rotation    Hip external rotation    Knee flexion    Knee extension    Ankle dorsiflexion    Ankle plantarflexion    Ankle inversion    Ankle eversion     (Blank rows = not tested)  LOWER EXTREMITY MMT:     WNLs for Legs. Weak core. MMT Right eval Left eval  Hip flexion    Hip extension    Hip abduction    Hip adduction    Hip internal  rotation    Hip external rotation    Knee flexion    Knee extension    Ankle dorsiflexion    Ankle plantarflexion    Ankle inversion    Ankle eversion     (Blank rows = not tested)  LUMBAR SPECIAL TESTS:  Straight leg raise test: Positive, Slump test: Negative, and SI Compression/distraction test: Positive  FUNCTIONAL TESTS:  Not tested  GAIT: Distance walked: 262ft Assistive device utilized: None Level of assistance: Complete Independence Comments: WHNLs at a decreased pace  TODAY'S TREATMENT:  [facility] Adult PT Treatment:                                                DATE: 07/17/22 Therapeutic Exercise: - Supine Posterior Pelvic Tilt  10 reps - 3 hold - Seated Flexion Stretch with Swiss Ball  10 reps - 10 hold  PATIENT EDUCATION:  Education details: Eval findings, POC, HEP Person educated: Patient Education method: Explanation, Demonstration, Tactile cues, Verbal cues, and Handouts Education comprehension: verbalized understanding, returned demonstration, verbal cues required, and tactile cues required  HOME EXERCISE PROGRAM: Access Code: [email or web address] URL: [email or web address] Date: 07/17/2022 Prepared by: Joellyn Rued  Exercises - Supine Posterior Pelvic Tilt  - 2 x daily - 7 x weekly - 2 sets - 10 reps - 3 hold - Seated Flexion Stretch with Swiss Ball  - 1 x daily - 7 x weekly - 1 sets - 10 reps - 10 hold  ASSESSMENT:  CLINICAL IMPRESSION: Patient is a 36 y.o. female who was seen today for physical therapy evaluation and treatment for M54.16 (ICD-10-CM) - Radiculopathy, lumbar region;M70.61 (ICD-10-CM) - Greater trochanteric bursitis of right hip; M53.3,G89.29 (ICD-10-CM) - Chronic SI joint pain; Q76.49 (ICD-10-CM) - Congenital fusion of lumbosacral spine. Pt presents with postural dysfunction with increased lordosis which does not  reverse with forward bending, radicular R leg pain c N/T, deceased trunk mobility with report of pain, decreased core strength, and tenderness to palpation of the R PSIS.  OBJECTIVE IMPAIRMENTS: decreased activity tolerance, difficulty walking, decreased ROM, decreased strength, postural dysfunction, obesity, and pain.   ACTIVITY LIMITATIONS: bending, sitting, stairs, dressing, and locomotion level  PARTICIPATION LIMITATIONS: meal prep, cleaning, laundry, driving, and occupation  PERSONAL FACTORS: Fitness, Past/current experiences, Time since onset of injury/illness/exacerbation, and 1 comorbidity: high BMI  are also affecting patient's functional outcome.   REHAB POTENTIAL: Good  CLINICAL DECISION MAKING: Evolving/moderate complexity  EVALUATION COMPLEXITY: Moderate   GOALS:  SHORT TERM GOALS: Target date: 08/08/22   Pt will be Ind in an initial HEP Baseline:initiated Goal status: INITIAL  2.  Pt will voice understanding of measures to assist in pain reduction Baseline:  Goal status: INITIAL  LONG TERM GOALS: Target date: 09/05/2022   Pt will be Ind in a final HEP to maintain achieved LOF Baseline: initiated Goal status: INITIAL  2.  Improve trunk ROM to min limitation for all movements as indication of decreased pain Baseline: Mod limitation for flexion and extension Goal status: INITIAL  3.  Pt will report a decrease on R LE pian to 5/10 or less with daily home and work related activities for improved QOL Baseline: 10/10 Goal status: INITIAL  4.  Pt's FOTO score will improved to the predicted value of 65% as indication of improved function  Baseline: 49% Goal status: INITIAL  PLAN:  PT FREQUENCY: 2x/week  PT DURATION: 6 weeks  PLANNED INTERVENTIONS: Therapeutic exercises, Therapeutic activity, Patient/Family education, Self Care, Aquatic Therapy, Dry Needling, Spinal manipulation, Spinal mobilization, Cryotherapy, Moist heat, Taping, Traction, Manual therapy,  and Re-evaluation.  PLAN FOR NEXT SESSION: Review FOTO; assess response to HEP; progress therex as indicated; use of modalities, manual therapy; and TPDN as indicated.  Karla Pavone MS, PT 07/17/22 9:55 AM

## 2022-07-21 ENCOUNTER — Ambulatory Visit: Payer: BC Managed Care – PPO

## 2022-07-22 NOTE — Therapy (Unsigned)
OUTPATIENT PHYSICAL THERAPY TREATMENT NOTE   Patient Name: Dorothy Haynes MRN: [id number] DOB:02/10/1986, 36 y.o., female Today's Date: 07/23/2022  PCP: Corky DownsBakare, seeing new doctor next week   REFERRING PROVIDER: Zonia KiefJames Owens, PA   END OF SESSION:   PT End of Session - 07/23/22 1048     Visit Number 2    Number of Visits 13    Date for PT Re-Evaluation 09/05/22    Authorization Type BCBS COMM PPO; [place] MEDICAID [employer]    Progress Note Due on Visit 10    PT Start Time 1055    PT Stop Time 1136    PT Time Calculation (min) 41 min    Activity Tolerance Patient tolerated treatment well    Behavior During Therapy WFL for tasks assessed/performed             Past Medical History:  Diagnosis Date   Anxiety    Back pain    Diabetes (HCC)    Diverticulitis 03/15/2018   Gallbladder problem    GERD (gastroesophageal reflux disease) 2009   Gestational diabetes 2008   Headache    History of gallstones 10/2015   s/p cholecystectomy 10/09/15   Hyperlipidemia    Hypertension    Joint pain    Menorrhagia with irregular cycle 10/23/2016   Morbid obesity (HCC) 10/09/2015   Physical deconditioning 06/22/2019   Plantar fasciitis, bilateral 06/02/2019   Shortness of breath dyspnea    Sleep apnea    TMJ (dislocation of temporomandibular joint), initial encounter 06/02/2019   Tobacco abuse 10/09/2015   Past Surgical History:  Procedure Laterality Date   CHOLECYSTECTOMY N/A 10/09/2015   Procedure: LAPAROSCOPIC CHOLECYSTECTOMY ;  Surgeon: Claud KelpHaywood Ingram, MD;  Location: [facility];  Service: General;  Laterality: N/A;   WISDOM TOOTH EXTRACTION     Patient Active Problem List   Diagnosis Date Noted   Well woman exam 05/27/2022   DUB (dysfunctional uterine bleeding) 05/27/2022   Symptomatic mammary hypertrophy 04/22/2022   Retinal edema 08/16/2021   Hypertensive retinopathy of both eyes 08/16/2021   Combined forms of age-related cataract of both eyes 08/16/2021    Leukocytosis, chronic 08/16/2021   Thrombocytosis, chronic 08/16/2021   Encounter for Depo-Provera contraception 04/29/2021   GERD (gastroesophageal reflux disease) 05/15/2020   Hyperlipidemia associated with type 2 diabetes mellitus (HCC) 07/13/2019   Type 2 diabetes mellitus without complication, without long-term current use of insulin (HCC) 03/22/2018   OSA (obstructive sleep apnea) 03/22/2018   Hypertension associated with diabetes (HCC) 02/08/2018   Morbid obesity (HCC) 10/09/2015   Tobacco abuse 10/09/2015    REFERRING DIAG: l Diagnosis  M54.16 (ICD-10-CM) - Radiculopathy, lumbar region  M70.61 (ICD-10-CM) - Greater trochanteric bursitis of right hip  M53.3,G89.29 (ICD-10-CM) - Chronic SI joint pain  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  Numb and tingling in Rt LE to my toes.  I didn't do the exercises.     PAIN:  Are you having pain? Yes: NPRS scale: 8/10 Pain location: low back and Rt hip, thigh, calf  Pain description: numb tingling  Aggravating factors: activity  Relieving factors: laying down, heat    OBJECTIVE: (objective measures completed at initial evaluation unless otherwise dated)  DIAGNOSTIC FINDINGS:  Xray Lumbar 06/11/22 FINDINGS: There is no evidence of lumbar spine fracture. Alignment is normal. Intervertebral disc spaces are maintained. Cholecystectomy clips are present.   IMPRESSION: Negative.   PATIENT SURVEYS:  FOTO: Perceived function    49%, predicted   65%    SCREENING FOR RED FLAGS: Bowel or bladder incontinence: No Spinal tumors: No Cauda equina syndrome: No Compression fracture: No   COGNITION: Overall cognitive status: Within functional limits for tasks assessed                          SENSATION: WFL   MUSCLE LENGTH: Hamstrings: Right WNLs deg; Left WNLs deg Maisie Fus test: Right Tight deg; Left Tight deg   POSTURE: rounded shoulders, forward head, increased lumbar lordosis, decreased thoracic kyphosis, anterior pelvic tilt, and weight shift left   PALPATION: TTP R PSIS. No tenderness to the lumbar paraspinals   LUMBAR ROM:    AROM eval  Flexion Mod limitation* s lordosis being reversed  Extension Min limitation*  Right lateral flexion Min limitation*  Left lateral flexion Min limitation  Right rotation Min limitation*  Left rotation Min limitation   (Blank rows = not tested)   LOWER EXTREMITY ROM:                          WNLs Active  Right eval Left eval  Hip flexion      Hip extension      Hip abduction      Hip adduction      Hip internal rotation      Hip external rotation      Knee flexion      Knee extension      Ankle dorsiflexion      Ankle plantarflexion      Ankle inversion      Ankle eversion       (Blank rows = not tested)   LOWER EXTREMITY MMT:                         WNLs for Legs. Weak core. MMT Right eval Left eval  Hip flexion      Hip extension      Hip abduction      Hip adduction      Hip internal rotation      Hip external rotation      Knee flexion      Knee extension      Ankle dorsiflexion      Ankle plantarflexion      Ankle inversion      Ankle eversion       (Blank rows = not tested)   LUMBAR SPECIAL TESTS:  Straight leg raise test: Positive, Slump test: Negative, and SI Compression/distraction test: Positive   FUNCTIONAL TESTS:  Not tested   GAIT: Distance walked: 230ft Assistive device utilized: None Level of assistance: Complete  Independence Comments: WHNLs at a decreased  pain  TODAY'S TREATMENT:       [facility] Adult PT Treatment:                                                DATE: 07/23/22 Therapeutic Exercise: Prone positioning Prone on forearms  x 8 Prone press up full 2 x 5 reps, 5 sec  Quadruped cat to neutral x 5 to child's pose , rocking Prone Tr A x 10 Prone knee flexion x 10 (pelvic stabilization)  Prone hip extension x 10  Supine breathing, core activation Supine bent knee fall out x 10 each side  Small ROM lower trunk rotation x 10  Wall slides for trunk extension  x 8  Manual Therapy: SLR passive (pos Rt LE, neg crossed LLE) LAD to Rt LE x 5, pain relief  Self Care: Disc pathology Directional preference with exercises POC (6 visit minimum) Imaging    [facility] Adult PT Treatment:                                                DATE: 07/17/22 Therapeutic Exercise: - Supine Posterior Pelvic Tilt  10 reps - 3 hold - Seated Flexion Stretch with Swiss Ball  10 reps - 10 hold Added prone today as LE symptoms eased a bit   PATIENT EDUCATION:  Education details: Eval findings, POC, HEP Person educated: Patient Education method: Explanation, Demonstration, Tactile cues, Verbal cues, and Handouts Education comprehension: verbalized understanding, returned demonstration, verbal cues required, and tactile cues required   HOME EXERCISE PROGRAM: Access Code: [email or web address] URL: [email or web address] Date: 07/17/2022 Prepared by: Joellyn Rued   Exercises - Supine Posterior Pelvic Tilt  - 2 x daily - 7 x weekly - 2 sets - 10 reps - 3 hold - Seated Flexion Stretch with Swiss Ball  - 1 x daily - 7 x weekly - 1 sets - 10 reps - 10 hold   ASSESSMENT:    CLINICAL IMPRESSION:  Patient was able to be seen at a time other than her scheduled appointment today.  She reports pain in right lower extremity today numbness, tingling as severe.  Her right lower extremity symptoms decreased when in prone press  up position.  Initiated some light core exercises and when she got off the table and walked out her pain was still 8 out of 10.  Asked her to try the prone exercises over the weekend to see if that would reduce some of the nerve irritation that she feels.  Continue plan of care.  Consider including modalities next session including IFC, heat?   OBJECTIVE IMPAIRMENTS: decreased activity tolerance, difficulty walking, decreased ROM, decreased strength, postural dysfunction, obesity, and pain.    ACTIVITY LIMITATIONS: bending, sitting, stairs, dressing, and locomotion level   PARTICIPATION LIMITATIONS: meal prep, cleaning, laundry, driving, and occupation   PERSONAL FACTORS: Fitness, Past/current experiences, Time since onset of injury/illness/exacerbation, and 1 comorbidity: high BMI  are also affecting patient's functional outcome.    REHAB POTENTIAL: Good   CLINICAL DECISION MAKING: Evolving/moderate complexity   EVALUATION COMPLEXITY: Moderate     GOALS:   SHORT TERM GOALS: Target date: 08/08/22     Pt will be Ind in an initial HEP Baseline:initiated Goal status: INITIAL  2.  Pt will voice understanding of measures to assist in pain reduction Baseline:  Goal status: INITIAL   LONG TERM GOALS: Target date: 09/05/2022     Pt will be Ind in a final HEP to maintain achieved LOF Baseline: initiated Goal status: INITIAL   2.  Improve trunk ROM to min limitation for all movements as indication of decreased pain Baseline: Mod limitation for flexion and extension Goal status: INITIAL   3.  Pt will report a decrease on R LE pian to 5/10 or less with daily home and work related activities for improved QOL Baseline: 10/10 Goal status: INITIAL   4.  Pt's FOTO score will improved to the predicted value of 65% as indication of improved function  Baseline: 49% Goal status: INITIAL   PLAN:   PT FREQUENCY: 2x/week   PT DURATION: 6 weeks   PLANNED INTERVENTIONS: Therapeutic  exercises, Therapeutic activity, Patient/Family education, Self Care, Aquatic Therapy, Dry Needling, Spinal manipulation, Spinal mobilization, Cryotherapy, Moist heat, Taping, Traction, Manual therapy, and Re-evaluation.   PLAN FOR NEXT SESSION: Prone progression?  progress therex as indicated; use of modalities, manual therapy; and TPDN as indicated.   Karie Mainland, PT 07/23/22 12:12 PM Phone: [phone] Fax: [phone]    Braylen Denunzio, PT 07/23/2022, 12:06 PM

## 2022-07-23 ENCOUNTER — Ambulatory Visit: Payer: BC Managed Care – PPO | Admitting: Physical Therapy

## 2022-07-23 ENCOUNTER — Ambulatory Visit
Admission: RE | Admit: 2022-07-23 | Discharge: 2022-07-23 | Disposition: A | Discharge status: Home / Self Care | Payer: BC Managed Care – PPO | Source: Ambulatory Visit | Attending: Obstetrics and Gynecology | Admitting: Obstetrics and Gynecology

## 2022-07-23 ENCOUNTER — Encounter: Payer: Self-pay | Admitting: Physical Therapy

## 2022-07-23 DIAGNOSIS — M5416 Radiculopathy, lumbar region: Secondary | ICD-10-CM | POA: Diagnosis not present

## 2022-07-23 DIAGNOSIS — G8929 Other chronic pain: Secondary | ICD-10-CM

## 2022-07-23 DIAGNOSIS — M6281 Muscle weakness (generalized): Secondary | ICD-10-CM

## 2022-07-23 DIAGNOSIS — M5441 Lumbago with sciatica, right side: Secondary | ICD-10-CM | POA: Diagnosis not present

## 2022-07-23 DIAGNOSIS — N938 Other specified abnormal uterine and vaginal bleeding: Secondary | ICD-10-CM | POA: Diagnosis not present

## 2022-07-23 DIAGNOSIS — Q7649 Other congenital malformations of spine, not associated with scoliosis: Secondary | ICD-10-CM | POA: Diagnosis not present

## 2022-07-23 DIAGNOSIS — M533 Sacrococcygeal disorders, not elsewhere classified: Secondary | ICD-10-CM | POA: Diagnosis not present

## 2022-07-23 DIAGNOSIS — M7061 Trochanteric bursitis, right hip: Secondary | ICD-10-CM | POA: Diagnosis not present

## 2022-07-29 ENCOUNTER — Encounter: Payer: Self-pay | Admitting: Physical Therapy

## 2022-07-29 ENCOUNTER — Ambulatory Visit: Payer: BC Managed Care – PPO | Admitting: Physical Therapy

## 2022-07-29 DIAGNOSIS — G8929 Other chronic pain: Secondary | ICD-10-CM | POA: Diagnosis not present

## 2022-07-29 DIAGNOSIS — M6281 Muscle weakness (generalized): Secondary | ICD-10-CM

## 2022-07-29 DIAGNOSIS — M7061 Trochanteric bursitis, right hip: Secondary | ICD-10-CM | POA: Diagnosis not present

## 2022-07-29 DIAGNOSIS — Q7649 Other congenital malformations of spine, not associated with scoliosis: Secondary | ICD-10-CM | POA: Diagnosis not present

## 2022-07-29 DIAGNOSIS — M5441 Lumbago with sciatica, right side: Secondary | ICD-10-CM | POA: Diagnosis not present

## 2022-07-29 DIAGNOSIS — M533 Sacrococcygeal disorders, not elsewhere classified: Secondary | ICD-10-CM | POA: Diagnosis not present

## 2022-07-29 DIAGNOSIS — M5416 Radiculopathy, lumbar region: Secondary | ICD-10-CM | POA: Diagnosis not present

## 2022-07-29 NOTE — Therapy (Addendum)
OUTPATIENT PHYSICAL THERAPY TREATMENT NOTE/Discharge   Patient Name: Dorothy Haynes MRN: [id number] DOB:1985/11/19, 36 y.o., female Today's Date: 07/29/2022  PCP: Corky Downs, seeing new doctor next week   REFERRING PROVIDER: Zonia Kief, PA   END OF SESSION:   PT End of Session - 07/29/22 0719     Visit Number 3    Number of Visits 13    Date for PT Re-Evaluation 09/05/22    Authorization Type BCBS COMM PPO; [place] MEDICAID [employer]    Progress Note Due on Visit 10    PT Start Time 0718    PT Stop Time 0800    PT Time Calculation (min) 42 min             Past Medical History:  Diagnosis Date   Anxiety    Back pain    Diabetes (HCC)    Diverticulitis 03/15/2018   Gallbladder problem    GERD (gastroesophageal reflux disease) 2009   Gestational diabetes 2008   Headache    History of gallstones 10/2015   s/p cholecystectomy 10/09/15   Hyperlipidemia    Hypertension    Joint pain    Menorrhagia with irregular cycle 10/23/2016   Morbid obesity (HCC) 10/09/2015   Physical deconditioning 06/22/2019   Plantar fasciitis, bilateral 06/02/2019   Shortness of breath dyspnea    Sleep apnea    TMJ (dislocation of temporomandibular joint), initial encounter 06/02/2019   Tobacco abuse 10/09/2015   Past Surgical History:  Procedure Laterality Date   CHOLECYSTECTOMY N/A 10/09/2015   Procedure: LAPAROSCOPIC CHOLECYSTECTOMY ;  Surgeon: Claud Kelp, MD;  Location: [facility];  Service: General;  Laterality: N/A;   WISDOM TOOTH EXTRACTION     Patient Active Problem List   Diagnosis Date Noted   Well woman exam 05/27/2022   DUB (dysfunctional uterine bleeding) 05/27/2022   Symptomatic mammary hypertrophy 04/22/2022   Retinal edema 08/16/2021   Hypertensive retinopathy of both eyes 08/16/2021   Combined forms of age-related cataract of both eyes 08/16/2021   Leukocytosis, chronic 08/16/2021   Thrombocytosis, chronic 08/16/2021   Encounter for Depo-Provera contraception  04/29/2021   GERD (gastroesophageal reflux disease) 05/15/2020   Hyperlipidemia associated with type 2 diabetes mellitus (HCC) 07/13/2019   Type 2 diabetes mellitus without complication, without long-term current use of insulin (HCC) 03/22/2018   OSA (obstructive sleep apnea) 03/22/2018   Hypertension associated with diabetes (HCC) 02/08/2018   Morbid obesity (HCC) 10/09/2015   Tobacco abuse 10/09/2015    REFERRING DIAG: l Diagnosis  M54.16 (ICD-10-CM) - Radiculopathy, lumbar region  M70.61 (ICD-10-CM) - Greater trochanteric bursitis of right hip  M53.3,G89.29 (ICD-10-CM) - Chronic SI joint pain  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   PAIN:  Are you having pain? Yes: NPRS scale: 8/10 Pain location: low back and Rt hip, thigh, calf  Pain description: numb tingling  Aggravating factors: activity  Relieving factors: laying down, heat    OBJECTIVE: (objective measures completed at initial evaluation unless otherwise dated)  DIAGNOSTIC FINDINGS:  Xray Lumbar 06/11/22 FINDINGS: There is no evidence of lumbar spine fracture. Alignment is normal. Intervertebral disc spaces are maintained. Cholecystectomy clips are present.   IMPRESSION: Negative.   PATIENT SURVEYS:  FOTO: Perceived function   49%, predicted   65%    SCREENING FOR RED FLAGS: Bowel or bladder incontinence: No Spinal tumors: No Cauda equina syndrome: No Compression fracture: No   COGNITION: Overall cognitive status:  Within functional limits for tasks assessed                          SENSATION: WFL   MUSCLE LENGTH: Hamstrings: Right WNLs deg; Left WNLs deg Maisie Fus test: Right Tight deg; Left Tight deg   POSTURE: rounded shoulders, forward head, increased lumbar lordosis, decreased thoracic kyphosis, anterior pelvic tilt, and weight shift left   PALPATION: TTP R PSIS. No tenderness to the lumbar paraspinals   LUMBAR ROM:    AROM eval  Flexion Mod limitation* s lordosis being reversed  Extension Min limitation*  Right lateral flexion Min limitation*  Left lateral flexion Min limitation  Right rotation Min limitation*  Left rotation Min limitation   (Blank rows = not tested)   LOWER EXTREMITY ROM:                          WNLs Active  Right eval Left eval  Hip flexion      Hip extension      Hip abduction      Hip adduction      Hip internal rotation      Hip external rotation      Knee flexion      Knee extension      Ankle dorsiflexion      Ankle plantarflexion      Ankle inversion      Ankle eversion       (Blank rows = not tested)   LOWER EXTREMITY MMT:                         WNLs for Legs. Weak core. MMT Right eval Left eval  Hip flexion      Hip extension      Hip abduction      Hip adduction      Hip internal rotation      Hip external rotation      Knee flexion      Knee extension      Ankle dorsiflexion      Ankle plantarflexion      Ankle inversion      Ankle eversion       (Blank rows = not tested)   LUMBAR SPECIAL TESTS:  Straight leg raise test: Positive, Slump test: Negative, and SI Compression/distraction test: Positive   FUNCTIONAL TESTS:  Not tested   GAIT: Distance walked: 258ft Assistive device utilized: None Level of assistance: Complete Independence Comments: WHNLs at a decreased pain  TODAY'S TREATMENT:     [facility] Adult PT Treatment:  DATE: 07/29/22 Therapeutic Exercise: Standing lumbar  extension 10 sec x 5; increased LE Sx Supine lying- pt reports no pain in this position SKTC x 2 for 15 sec each Piriformis stretch 15 sec x 2 each  Pt reports aggravation of back pain after stretches LTR  PPT x 10 PPT with bilateral clam  Discussed potential mechanical traction a next session  Modalities: IFC with HMP prone lying x 15 min   [facility] Adult PT Treatment:                                                DATE: 07/23/22 Therapeutic Exercise: Prone positioning Prone on forearms  x 8 Prone press up full 2 x 5 reps, 5 sec  Quadruped cat to neutral x 5 to child's pose , rocking Prone Tr A x 10 Prone knee flexion x 10 (pelvic stabilization)  Prone hip extension x 10  Supine breathing, core activation Supine bent knee fall out x 10 each side  Small ROM lower trunk rotation x 10  Wall slides for trunk extension  x 8  Manual Therapy: SLR passive (pos Rt LE, neg crossed LLE) LAD to Rt LE x 5, pain relief  Self Care: Disc pathology Directional preference with exercises POC (6 visit minimum) Imaging    [facility] Adult PT Treatment:                                                DATE: 07/17/22 Therapeutic Exercise: - Supine Posterior Pelvic Tilt  10 reps - 3 hold - Seated Flexion Stretch with Swiss Ball  10 reps - 10 hold Added prone today as LE symptoms eased a bit   PATIENT EDUCATION:  Education details: Eval findings, POC, HEP Person educated: Patient Education method: Explanation, Demonstration, Tactile cues, Verbal cues, and Handouts Education comprehension: verbalized understanding, returned demonstration, verbal cues required, and tactile cues required   HOME EXERCISE PROGRAM: Access Code: [email or web address] URL: [email or web address] Date: 07/17/2022 Prepared by: Joellyn Rued   Exercises - Supine Posterior Pelvic Tilt  - 2 x daily - 7 x weekly - 2 sets - 10 reps - 3 hold - Seated Flexion Stretch with Swiss Ball  - 1 x daily - 7 x weekly - 1 sets - 10 reps - 10  hold   ASSESSMENT:    CLINICAL IMPRESSION: Patient arrives reporting no change in sx and that pain is aggravated by PT sessions. She denies improvement with HEP or prone positioning techniques performed at last visit. Trial of repeated standing lumbar extension which she reported increase in her leg pan. Maintained supine position for neutral spine stretching. She reported increased back pain after stretching. Discussed potential trial of mechanical traction however she wants to wait until last visit. Trial of Clinic IFC for pain relief. She chose to be prone for this modality. She denied any change in pain level or sx post treatment. Will consider mechanical traction next session.   OBJECTIVE IMPAIRMENTS: decreased activity tolerance, difficulty walking, decreased ROM, decreased strength, postural dysfunction, obesity, and pain.    ACTIVITY LIMITATIONS: bending, sitting, stairs, dressing, and locomotion level   PARTICIPATION LIMITATIONS: meal prep, cleaning, laundry, driving, and occupation   PERSONAL FACTORS: Fitness, Past/current experiences, Time since onset  of injury/illness/exacerbation, and 1 comorbidity: high BMI  are also affecting patient's functional outcome.    REHAB POTENTIAL: Good   CLINICAL DECISION MAKING: Evolving/moderate complexity   EVALUATION COMPLEXITY: Moderate     GOALS:   SHORT TERM GOALS: Target date: 08/08/22     Pt will be Ind in an initial HEP Baseline:initiated Goal status: INITIAL   2.  Pt will voice understanding of measures to assist in pain reduction Baseline:  Goal status: INITIAL   LONG TERM GOALS: Target date: 09/05/2022     Pt will be Ind in a final HEP to maintain achieved LOF Baseline: initiated Goal status: INITIAL   2.  Improve trunk ROM to min limitation for all movements as indication of decreased pain Baseline: Mod limitation for flexion and extension Goal status: INITIAL   3.  Pt will report a decrease on R LE pian to 5/10 or  less with daily home and work related activities for improved QOL Baseline: 10/10 Goal status: INITIAL   4.  Pt's FOTO score will improved to the predicted value of 65% as indication of improved function  Baseline: 49% Goal status: INITIAL   PLAN:   PT FREQUENCY: 2x/week   PT DURATION: 6 weeks   PLANNED INTERVENTIONS: Therapeutic exercises, Therapeutic activity, Patient/Family education, Self Care, Aquatic Therapy, Dry Needling, Spinal manipulation, Spinal mobilization, Cryotherapy, Moist heat, Taping, Traction, Manual therapy, and Re-evaluation.   PLAN FOR NEXT SESSION: Prone progression? Try mechanical traction?   progress therex as indicated; use of modalities, manual therapy; and TPDN as indicated.     Jannette SpannerJessica Klever [place], PTA 07/29/22 12:43 PM Phone: [phone] Fax: [phone]   PHYSICAL THERAPY DISCHARGE SUMMARY  Visits from Start of Care: 3  Current functional level related to goals / functional outcomes: See clinical impression and PT goals  Remaining deficits: See clinical impression and PT goals    Education / Equipment: HEP   Patient agrees to discharge. Patient goals were not met. Patient is being discharged due to not returning since the last visit.  Allen Ralls MS, PT 09/30/22 10:58 AM

## 2022-07-30 ENCOUNTER — Ambulatory Visit (INDEPENDENT_AMBULATORY_CARE_PROVIDER_SITE_OTHER): Payer: BC Managed Care – PPO | Admitting: Surgery

## 2022-07-30 ENCOUNTER — Encounter: Payer: Self-pay | Admitting: Surgery

## 2022-07-30 ENCOUNTER — Other Ambulatory Visit (HOSPITAL_BASED_OUTPATIENT_CLINIC_OR_DEPARTMENT_OTHER): Payer: Self-pay

## 2022-07-30 DIAGNOSIS — M5416 Radiculopathy, lumbar region: Secondary | ICD-10-CM | POA: Diagnosis not present

## 2022-07-30 DIAGNOSIS — M533 Sacrococcygeal disorders, not elsewhere classified: Secondary | ICD-10-CM

## 2022-07-30 DIAGNOSIS — G8929 Other chronic pain: Secondary | ICD-10-CM

## 2022-07-30 DIAGNOSIS — Q7649 Other congenital malformations of spine, not associated with scoliosis: Secondary | ICD-10-CM | POA: Diagnosis not present

## 2022-07-30 DIAGNOSIS — M7061 Trochanteric bursitis, right hip: Secondary | ICD-10-CM

## 2022-07-30 MED ORDER — MOUNJARO 12.5 MG/0.5ML ~~LOC~~ SOAJ
12.5000 mg | SUBCUTANEOUS | 1 refills | Status: DC
  Filled 2022-07-30: qty 2, 28d supply, fill #0

## 2022-07-30 NOTE — Progress Notes (Signed)
Office Visit Note   Patient: Dorothy Haynes           Date of Birth: Jun 28, 1986           MRN: [id number] Visit Date: 07/30/2022              Requested by: Audley Hose, MD [place],  [place] [id number] PCP: Audley Hose, MD   Assessment & Plan: Visit Diagnoses:  1. Congenital fusion of lumbosacral spine   2. Radiculopathy, lumbar region   3. Chronic right SI joint pain   4. Chronic left SI joint pain     Plan: With patient's ongoing symptoms that failed conservative treatment I will order lumbar MRI to rule out HNP/gnosis.  Radiologist can compare to study that was done in 2016.  Also will have blood work drawn today to check a CBC and arthritis panel.  Follow-up with Dr. Laurance Flatten in 3 weeks for recheck to review MRI and he can also discuss labs at that time.  Follow-Up Instructions: Return in about 3 weeks (around 08/20/2022) for With Dr. Laurance Flatten to review lumbar MRI and blood work.   Orders:  Orders Placed This Encounter  Procedures   MR LUMBAR SPINE WO CONTRAST   CBC   Antinuclear Antib (ANA)   Rheumatoid Factor   Sed Rate (ESR)   Uric acid   C-reactive protein   No orders of the defined types were placed in this encounter.     Procedures: No procedures performed   Clinical Data: No additional findings.   Subjective: Chief Complaint  Patient presents with   Lower Back - Pain   Right Hip - Follow-up    HPI 36 year old black female returns for recheck.  States that previous right hip greater trochanter bursa injection gave good relief for 2 to 3 days.  She has gone to physical therapy and states that this has been aggravating her back pain.  He described pain across the low back that extends into the right lower extremity. Review of Systems No current complaints of cardiopulmonary GI/GU issues  Objective: Vital Signs: There were no vitals taken for this visit.  Physical Exam HENT:     Head: Normocephalic and  atraumatic.     Nose: Nose normal.  Eyes:     Extraocular Movements: Extraocular movements intact.  Musculoskeletal:     Comments: Gait is somewhat antalgic.  Mildly tender over the bilateral SI joints and right hip greater trochanter bursa.  Positive right-sided notch tenderness and negative on the left side.    Neurological:     Mental Status: She is alert and oriented to person, place, and time.  Psychiatric:        Mood and Affect: Mood normal.     Ortho Exam  Specialty Comments:  No specialty comments available.  Imaging: No results found.   PMFS History: Patient Active Problem List   Diagnosis Date Noted   Well woman exam 05/27/2022   DUB (dysfunctional uterine bleeding) 05/27/2022   Symptomatic mammary hypertrophy 04/22/2022   Retinal edema 08/16/2021   Hypertensive retinopathy of both eyes 08/16/2021   Combined forms of age-related cataract of both eyes 08/16/2021   Leukocytosis, chronic 08/16/2021   Thrombocytosis, chronic 08/16/2021   Encounter for Depo-Provera contraception 04/29/2021   GERD (gastroesophageal reflux disease) 05/15/2020   Hyperlipidemia associated with type 2 diabetes mellitus ([facility]) 07/13/2019   Type 2 diabetes mellitus without complication, without long-term current use of insulin ([facility]) 03/22/2018  OSA (obstructive sleep apnea) 03/22/2018   Hypertension associated with diabetes (HCC) 02/08/2018   Morbid obesity (HCC) 10/09/2015   Tobacco abuse 10/09/2015   Past Medical History:  Diagnosis Date   Anxiety    Back pain    Diabetes (HCC)    Diverticulitis 03/15/2018   Gallbladder problem    GERD (gastroesophageal reflux disease) 2009   Gestational diabetes 2008   Headache    History of gallstones 10/2015   s/p cholecystectomy 10/09/15   Hyperlipidemia    Hypertension    Joint pain    Menorrhagia with irregular cycle 10/23/2016   Morbid obesity (HCC) 10/09/2015   Physical deconditioning 06/22/2019   Plantar fasciitis, bilateral  06/02/2019   Shortness of breath dyspnea    Sleep apnea    TMJ (dislocation of temporomandibular joint), initial encounter 06/02/2019   Tobacco abuse 10/09/2015    Family History  Problem Relation Age of Onset   Drug abuse Mother    Heart disease Mother    Diabetes Mother    Hypertension Mother    Cataracts Mother    Cancer Father    Throat cancer Father    Kidney Stones Father    Alcoholism Father    Drug abuse Father    Diabetes Maternal Grandmother    Diabetes Maternal Grandfather    Blindness Maternal Aunt    Alzheimer's disease Other    Diabetes Other    Hypertension Other    Heart disease Other    Heart disease Other    Diabetes Other    Hypertension Other    Thyroid disease Other    Amblyopia Neg Hx    Glaucoma Neg Hx    Macular degeneration Neg Hx    Retinal detachment Neg Hx    Strabismus Neg Hx    Retinitis pigmentosa Neg Hx     Past Surgical History:  Procedure Laterality Date   CHOLECYSTECTOMY N/A 10/09/2015   Procedure: LAPAROSCOPIC CHOLECYSTECTOMY ;  Surgeon: Haywood Ingram, MD;  Location: [facility];  Service: General;  Laterality: N/A;   WISDOM TOOTH EXTRACTION     Social History   Occupational History    Employer: [employer]    Comment: part-time   Occupation: Transport driver and baker  Tobacco Use   Smoking status: Former    Packs/day: 0.20    Years: 17.00    Total pack years: 3.40    Types: Cigarettes    Quit date: 03/01/2018    Years since quitting: 4.4   Smokeless tobacco: Never  Vaping Use   Vaping Use: Never used  Substance and Sexual Activity   Alcohol use: Yes    Comment: occ, 1-2 drink per week   Drug use: No    Comment: previously used MJ, has been off recreational drugs   Sexual activity: Yes    Partners: Male    Birth control/protection: Injection        

## 2022-07-31 ENCOUNTER — Ambulatory Visit: Payer: BC Managed Care – PPO | Admitting: Physical Therapy

## 2022-07-31 DIAGNOSIS — M5431 Sciatica, right side: Secondary | ICD-10-CM | POA: Diagnosis not present

## 2022-07-31 DIAGNOSIS — Z6841 Body Mass Index (BMI) 40.0 and over, adult: Secondary | ICD-10-CM | POA: Diagnosis not present

## 2022-07-31 DIAGNOSIS — E1169 Type 2 diabetes mellitus with other specified complication: Secondary | ICD-10-CM | POA: Diagnosis not present

## 2022-08-02 ENCOUNTER — Other Ambulatory Visit (HOSPITAL_BASED_OUTPATIENT_CLINIC_OR_DEPARTMENT_OTHER): Payer: Self-pay

## 2022-08-02 LAB — CBC
HCT: 36.6 % (ref 35.0–45.0)
Hemoglobin: 12.4 g/dL (ref 11.7–15.5)
MCH: 29.1 pg (ref 27.0–33.0)
MCHC: 33.9 g/dL (ref 32.0–36.0)
MCV: 85.9 fL (ref 80.0–100.0)
MPV: 9.7 fL (ref 7.5–12.5)
Platelets: 429 10*3/uL — ABNORMAL HIGH (ref 140–400)
RBC: 4.26 10*6/uL (ref 3.80–5.10)
RDW: 13.1 % (ref 11.0–15.0)
WBC: 10.8 10*3/uL (ref 3.8–10.8)

## 2022-08-02 LAB — SEDIMENTATION RATE: Sed Rate: 60 mm/h — ABNORMAL HIGH (ref 0–20)

## 2022-08-02 LAB — URIC ACID: Uric Acid, Serum: 4.3 mg/dL (ref 2.5–7.0)

## 2022-08-02 LAB — C-REACTIVE PROTEIN: CRP: 8.8 mg/L — ABNORMAL HIGH (ref <8.0)

## 2022-08-02 LAB — RHEUMATOID FACTOR: Rheumatoid fact SerPl-aCnc: <14 IU/mL (ref <14)

## 2022-08-02 LAB — ANTI-NUCLEAR AB-TITER (ANA TITER): ANA Titer 1: 1:40 {titer} — ABNORMAL HIGH

## 2022-08-02 LAB — ANA: Anti Nuclear Antibody (ANA): POSITIVE — AB

## 2022-08-05 ENCOUNTER — Ambulatory Visit: Payer: BC Managed Care – PPO

## 2022-08-07 ENCOUNTER — Ambulatory Visit: Payer: BC Managed Care – PPO | Admitting: Physical Therapy

## 2022-08-08 ENCOUNTER — Telehealth: Payer: Self-pay | Admitting: *Deleted

## 2022-08-08 NOTE — Telephone Encounter (Signed)
[place] - [place]  Secondary [employer] [place] - approved

## 2022-08-12 ENCOUNTER — Ambulatory Visit (INDEPENDENT_AMBULATORY_CARE_PROVIDER_SITE_OTHER): Payer: BC Managed Care – PPO | Admitting: Family Medicine

## 2022-08-12 ENCOUNTER — Encounter: Payer: Self-pay | Admitting: Family Medicine

## 2022-08-12 VITALS — BP 128/73 | HR 83 | Temp 98.1°F | Resp 12 | Ht 63.0 in | Wt 258.4 lb

## 2022-08-12 DIAGNOSIS — E1169 Type 2 diabetes mellitus with other specified complication: Secondary | ICD-10-CM

## 2022-08-12 DIAGNOSIS — E785 Hyperlipidemia, unspecified: Secondary | ICD-10-CM

## 2022-08-12 DIAGNOSIS — Z6841 Body Mass Index (BMI) 40.0 and over, adult: Secondary | ICD-10-CM

## 2022-08-12 DIAGNOSIS — M5441 Lumbago with sciatica, right side: Secondary | ICD-10-CM | POA: Diagnosis not present

## 2022-08-12 DIAGNOSIS — I10 Essential (primary) hypertension: Secondary | ICD-10-CM

## 2022-08-12 DIAGNOSIS — Z7689 Persons encountering health services in other specified circumstances: Secondary | ICD-10-CM

## 2022-08-12 NOTE — Progress Notes (Unsigned)
New Patient Office Visit  Subjective    Patient ID: Dorothy Haynes, female    DOB: [birth year]  Age: 36 y.o. MRN: [id number]  CC:  Chief Complaint  Patient presents with   Establish Care    HPI SPECIAL RANES presents to establish care and to review chronic med issues.   Outpatient Encounter Medications as of 08/12/2022  Medication Sig   calcium-vitamin D (OSCAL WITH D) 500-200 MG-UNIT tablet Take 1 tablet by mouth 2 (two) times daily.   cetirizine (ZYRTEC) 10 MG tablet Take 1 tablet by mouth once daily   ferrous sulfate 325 (65 FE) MG tablet Take 1 tablet (325 mg total) by mouth daily with breakfast.   ibuprofen (ADVIL) 800 MG tablet Take 800 mg by mouth 3 (three) times daily as needed.   losartan (COZAAR) 25 MG tablet TAKE 1 TABLET BY MOUTH AT BEDTIME   potassium chloride (KLOR-CON M) 10 MEQ tablet Take 1-2 tablets (10-20 mEq total) by mouth at bedtime.   rosuvastatin (CRESTOR) 10 MG tablet Take 10 mg by mouth at bedtime.   tirzepatide (MOUNJARO) 10 MG/0.5ML Pen Inject 10 mg into the skin once a week.   tirzepatide (MOUNJARO) 12.5 MG/0.5ML Pen Inject 12.5 mg into the skin every 7 (seven) days.   VENTOLIN HFA 108 (90 Base) MCG/ACT inhaler Inhale into the lungs.   No facility-administered encounter medications on file as of 08/12/2022.    Past Medical History:  Diagnosis Date   Anxiety    Back pain    Diabetes (HCC)    Diverticulitis 03/15/2018   Gallbladder problem    GERD (gastroesophageal reflux disease) 2009   Gestational diabetes 2008   Headache    History of gallstones 10/2015   s/p cholecystectomy 10/09/15   Hyperlipidemia    Hypertension    Joint pain    Menorrhagia with irregular cycle 10/23/2016   Morbid obesity (HCC) 10/09/2015   Physical deconditioning 06/22/2019   Plantar fasciitis, bilateral 06/02/2019   Shortness of breath dyspnea    Sleep apnea    TMJ (dislocation of temporomandibular joint), initial encounter 06/02/2019   Tobacco abuse 10/09/2015     Past Surgical History:  Procedure Laterality Date   CHOLECYSTECTOMY N/A 10/09/2015   Procedure: LAPAROSCOPIC CHOLECYSTECTOMY ;  Surgeon: Claud Kelp, MD;  Location: [facility];  Service: General;  Laterality: N/A;   WISDOM TOOTH EXTRACTION      Family History  Problem Relation Age of Onset   Drug abuse Mother    Heart disease Mother    Diabetes Mother    Hypertension Mother    Cataracts Mother    Cancer Father    Throat cancer Father    Kidney Stones Father    Alcoholism Father    Drug abuse Father    Diabetes Maternal Grandmother    Diabetes Maternal Grandfather    Blindness Maternal Aunt    Alzheimer's disease Other    Diabetes Other    Hypertension Other    Heart disease Other    Heart disease Other    Diabetes Other    Hypertension Other    Thyroid disease Other    Amblyopia Neg Hx    Glaucoma Neg Hx    Macular degeneration Neg Hx    Retinal detachment Neg Hx    Strabismus Neg Hx    Retinitis pigmentosa Neg Hx     Social History   Socioeconomic History   Marital status: Single    Spouse name: Not on file  Number of children: 1   Years of education: college graduate   Highest education level: Not on file  Occupational History    Employer: [employer]    Comment: part-time   Occupation: Press photographer  Tobacco Use   Smoking status: Former    Packs/day: 0.20    Years: 17.00    Total pack years: 3.40    Types: Cigarettes    Quit date: 03/01/2018    Years since quitting: 4.4   Smokeless tobacco: Never  Vaping Use   Vaping Use: Never used  Substance and Sexual Activity   Alcohol use: Yes    Comment: occ, 1-2 drink per week   Drug use: No    Comment: previously used MJ, has been off recreational drugs   Sexual activity: Yes    Partners: Male    Birth control/protection: Injection  Other Topics Concern   Not on file  Social History Narrative   Lives with mother and son   Social Determinants of Health   Financial Resource Strain:  Not on file  Food Insecurity: Food Insecurity Present (04/29/2021)   Hunger Vital Sign    Worried About Running Out of Food in the Last Year: Sometimes true    Ran Out of Food in the Last Year: Sometimes true  Transportation Needs: No Transportation Needs (04/29/2021)   PRAPARE - Administrator, Civil Service (Medical): No    Lack of Transportation (Non-Medical): No  Physical Activity: Not on file  Stress: Not on file  Social Connections: Not on file  Intimate Partner Violence: Not on file    ROS      Objective    BP 128/73   Pulse 83   Temp 98.1 F (36.7 C) (Oral)   Resp 12   Ht 5\' 3"  (1.6 m)   Wt 258 lb 6.4 oz (117.2 kg)   SpO2 96%   BMI 45.77 kg/m   Physical Exam  {Labs (Optional):23779}    Assessment & Plan:   Problem List Items Addressed This Visit       Endocrine   Hyperlipidemia associated with type 2 diabetes mellitus (HCC)   Other Visit Diagnoses     Encounter to establish care    -  Primary   Essential hypertension           No follow-ups on file.   , MD

## 2022-08-13 ENCOUNTER — Other Ambulatory Visit: Payer: Self-pay

## 2022-08-13 ENCOUNTER — Ambulatory Visit (INDEPENDENT_AMBULATORY_CARE_PROVIDER_SITE_OTHER): Payer: BC Managed Care – PPO

## 2022-08-13 DIAGNOSIS — Z3042 Encounter for surveillance of injectable contraceptive: Secondary | ICD-10-CM | POA: Diagnosis not present

## 2022-08-13 MED ORDER — MEDROXYPROGESTERONE ACETATE 150 MG/ML IM SUSP
150.0000 mg | Freq: Once | INTRAMUSCULAR | Status: AC
  Administered 2022-08-13: 150 mg via INTRAMUSCULAR

## 2022-08-13 NOTE — Addendum Note (Signed)
Addended by: Nira Retort D on: 08/13/2022 09:33 AM   Modules accepted: Orders

## 2022-08-13 NOTE — Progress Notes (Signed)
Dorothy Haynes here for Depo-Provera Injection. Injection administered without complication. Patient will return in 3 months for next injection between 02/28 and 03/14. Pt is currently scheduled for hysterectomy with Dr. Alysia Penna 01/30. She states this is her last dose. Next annual visit due September 2024.   Janeece Agee, RN 08/13/2022  9:12 AM

## 2022-08-14 ENCOUNTER — Encounter: Payer: BC Managed Care – PPO | Admitting: Physical Therapy

## 2022-08-14 ENCOUNTER — Encounter: Payer: Self-pay | Admitting: Family Medicine

## 2022-08-21 ENCOUNTER — Encounter: Payer: BC Managed Care – PPO | Admitting: Physical Therapy

## 2022-08-22 ENCOUNTER — Ambulatory Visit
Admission: RE | Admit: 2022-08-22 | Discharge: 2022-08-22 | Disposition: A | Discharge status: Home / Self Care | Payer: BC Managed Care – PPO | Source: Ambulatory Visit | Attending: Surgery | Admitting: Surgery

## 2022-08-22 DIAGNOSIS — Q7649 Other congenital malformations of spine, not associated with scoliosis: Secondary | ICD-10-CM

## 2022-08-22 DIAGNOSIS — M5416 Radiculopathy, lumbar region: Secondary | ICD-10-CM

## 2022-08-22 DIAGNOSIS — M48061 Spinal stenosis, lumbar region without neurogenic claudication: Secondary | ICD-10-CM | POA: Diagnosis not present

## 2022-08-26 ENCOUNTER — Encounter: Payer: BC Managed Care – PPO | Admitting: Physical Therapy

## 2022-08-26 ENCOUNTER — Ambulatory Visit: Payer: Self-pay

## 2022-08-26 ENCOUNTER — Ambulatory Visit: Payer: Self-pay | Admitting: *Deleted

## 2022-08-26 NOTE — Telephone Encounter (Signed)
Patient called and she says she just finished speaking to a triage nurse. Duplicate encounter from today.   Summary: pt vomiting Diarrhea 1 wk   Pt called back w/ concerns she has not heard from a nurse. Wants t know if she should go to UC.  I am attempting to get a nurse  ----- Message from Aretta Nip sent at 08/26/2022 10:58 AM EST ----- Pt requesting call back vomiting /Diarrhea 1 wk made appt for 12/28-soonest available. PT wants advice or anything she can take to get her to appt 12/28 [phone]

## 2022-08-26 NOTE — Telephone Encounter (Addendum)
Reason for Disposition  MILD vomiting with diarrhea  Answer Assessment - Initial Assessment Questions 1. VOMITING SEVERITY: "How many times have you vomited in the past 24 hours?"     - MILD:  1 - 2 times/day    - MODERATE: 3 - 5 times/day, decreased oral intake without significant weight loss or symptoms of dehydration    - SEVERE: 6 or more times/day, vomits everything or nearly everything, with significant weight loss, symptoms of dehydration     Vomiting and diarrhea. 2. ONSET: "When did the vomiting begin?"      Last Monday diarrhea started still going.         Thur. I vomited.    Sunday I started not feeling good again.   Vomited Sun. And Mon. And 3 times this morning.    3. FLUIDS: "What fluids or food have you vomited up today?" "Have you been able to keep any fluids down?"     It's yellow stuff I'm vomiting.     I've had diarrhea once today.      I'm drinking Pedialyte and it's really helping.   Since 1 AM I've been vomiting and diarrhea.    I'm feeling better than I did this morning.   I haven't eaten anything.   I feel better.    4. ABDOMEN PAIN: "Are your having any abdomen pain?" If Yes : "How bad is it and what does it feel like?" (e.g., crampy, dull, intermittent, constant)      If I try to eat something my stomach will hurt.    I went over the eating soft foods that are easily digested and to avoid spicy and fried foods.   Continue drinking the Pedialyte and take Pepto-Bismal for the diarrhea and vomiting     "My mother told me to get some Pepto Bismal and I did but I haven't taken any of it".     5. DIARRHEA: "Is there any diarrhea?" If Yes, ask: "How many times today?"      Yes    Only once today 6. CONTACTS: "Is there anyone else in the family with the same symptoms?"      My son has been sick but he is better now. 7. CAUSE: "What do you think is causing your vomiting?"     I don't know.   My son has been sick. 8. HYDRATION STATUS: "Any signs of dehydration?" (e.g., dry mouth  [not only dry lips], too weak to stand) "When did you last urinate?"     She is drinking Pedialyte and feeling better this evening since starting it. 9. OTHER SYMPTOMS: "Do you have any other symptoms?" (e.g., fever, headache, vertigo, vomiting blood or coffee grounds, recent head injury)     Just the diarrhea and vomiting. 10. PREGNANCY: "Is there any chance you are pregnant?" "When was your last menstrual period?"       Not asked  Protocols used: Vomiting-A-AH  Chief Complaint: Diarrhea and vomiting over the past week.  Feeling better today after starting Pedialyte.   Symptoms: Diarrhea only once today.   Vomited 3 times this morning.  Frequency: Since Sunday Pertinent Negatives: Patient denies N/A Disposition: [] ED /[] Urgent Care (no appt availability in office) / [] Appointment(In office/virtual)/ []  [facility]/ [x] Home Care/ [] Refused Recommended Disposition /[] [facility]/ []  Follow-up with PCP Additional Notes: Went over home care.   She was agreeable to that and taking the [facility] along with continuing the Pedialyte.  Instructed to call back if not better with the continuing the Pedialyte and taking the Pepto Bismal in a day or two.

## 2022-08-26 NOTE — Progress Notes (Deleted)
Patient ID: Dorothy Haynes, female    DOB: 11-13-1985  MRN: [id number]  CC: No chief complaint on file.   Subjective: Dorothy Haynes is a 36 y.o. female who presents for  Her concerns today include:    sick vomiting /diaherra  Last appt 08/12/2022 with Andrey Campanile for chronic meds  Patient Active Problem List   Diagnosis Date Noted   Well woman exam 05/27/2022   DUB (dysfunctional uterine bleeding) 05/27/2022   Symptomatic mammary hypertrophy 04/22/2022   Retinal edema 08/16/2021   Hypertensive retinopathy of both eyes 08/16/2021   Combined forms of age-related cataract of both eyes 08/16/2021   Leukocytosis, chronic 08/16/2021   Thrombocytosis, chronic 08/16/2021   Encounter for Depo-Provera contraception 04/29/2021   GERD (gastroesophageal reflux disease) 05/15/2020   Hyperlipidemia associated with type 2 diabetes mellitus (HCC) 07/13/2019   Type 2 diabetes mellitus without complication, without long-term current use of insulin ([facility]) 03/22/2018   OSA (obstructive sleep apnea) 03/22/2018   Hypertension associated with diabetes ([facility]CC) 02/08/2018   Morbid obesity ([facility]CC) 10/09/2015   Tobacco abuse 10/09/2015     Current Outpatient Medications on File Prior to Visit  Medication Sig Dispense Refill   calcium-vitamin D (OSCAL WITH D) 500-200 MG-UNIT tablet Take 1 tablet by mouth 2 (two) times daily. 60 tablet 11   cetirizine (ZYRTEC) 10 MG tablet Take 1 tablet by mouth once daily 30 tablet 11   ferrous sulfate 325 (65 FE) MG tablet Take 1 tablet (325 mg total) by mouth daily with breakfast. 30 tablet 5   ibuprofen (ADVIL) 800 MG tablet Take 800 mg by mouth 3 (three) times daily as needed.     losartan (COZAAR) 25 MG tablet TAKE 1 TABLET BY MOUTH AT BEDTIME 90 tablet 0   potassium chloride (KLOR-CON M) 10 MEQ tablet Take 1-2 tablets (10-20 mEq total) by mouth at bedtime. 24 tablet 0   rosuvastatin (CRESTOR) 10 MG tablet Take 10 mg by mouth at bedtime.     tirzepatide (MOUNJARO) 10  MG/0.5ML Pen Inject 10 mg into the skin once a week. 6 mL 1   tirzepatide (MOUNJARO) 12.5 MG/0.5ML Pen Inject 12.5 mg into the skin every 7 (seven) days. 2 mL 1   VENTOLIN HFA 108 (90 Base) MCG/ACT inhaler Inhale into the lungs.     No current facility-administered medications on file prior to visit.    No Known Allergies  Social History   Socioeconomic History   Marital status: Single    Spouse name: Not on file   Number of children: 1   Years of education: college graduate   Highest education level: Not on file  Occupational History    Employer: [employer]    Comment: part-time   Occupation: Press photographer  Tobacco Use   Smoking status: Former    Packs/day: 0.20    Years: 17.00    Total pack years: 3.40    Types: Cigarettes    Quit date: 03/01/2018    Years since quitting: 4.4   Smokeless tobacco: Never  Vaping Use   Vaping Use: Never used  Substance and Sexual Activity   Alcohol use: Yes    Comment: occ, 1-2 drink per week   Drug use: No    Comment: previously used MJ, has been off recreational drugs   Sexual activity: Yes    Partners: Male    Birth control/protection: Injection  Other Topics Concern   Not on file  Social History Narrative   Lives with  mother and son   Social Determinants of Health   Financial Resource Strain: Not on file  Food Insecurity: Food Insecurity Present (04/29/2021)   Hunger Vital Sign    Worried About Running Out of Food in the Last Year: Sometimes true    Ran Out of Food in the Last Year: Sometimes true  Transportation Needs: No Transportation Needs (04/29/2021)   PRAPARE - Administrator, Civil Service (Medical): No    Lack of Transportation (Non-Medical): No  Physical Activity: Not on file  Stress: Not on file  Social Connections: Not on file  Intimate Partner Violence: Not on file    Family History  Problem Relation Age of Onset   Drug abuse Mother    Heart disease Mother    Diabetes Mother     Hypertension Mother    Cataracts Mother    Cancer Father    Throat cancer Father    Kidney Stones Father    Alcoholism Father    Drug abuse Father    Diabetes Maternal Grandmother    Diabetes Maternal Grandfather    Blindness Maternal Aunt    Alzheimer's disease Other    Diabetes Other    Hypertension Other    Heart disease Other    Heart disease Other    Diabetes Other    Hypertension Other    Thyroid disease Other    Amblyopia Neg Hx    Glaucoma Neg Hx    Macular degeneration Neg Hx    Retinal detachment Neg Hx    Strabismus Neg Hx    Retinitis pigmentosa Neg Hx     Past Surgical History:  Procedure Laterality Date   CHOLECYSTECTOMY N/A 10/09/2015   Procedure: LAPAROSCOPIC CHOLECYSTECTOMY ;  Surgeon: Claud Kelp, MD;  Location: [facility];  Service: General;  Laterality: N/A;   WISDOM TOOTH EXTRACTION      ROS: Review of Systems Negative except as stated above  PHYSICAL EXAM: There were no vitals taken for this visit.  Physical Exam  {female adult master:310786} {female adult master:310785}     Latest Ref Rng & Units 10/14/2021    9:47 AM 08/15/2021    9:06 AM 04/04/2020   10:54 AM  CMP  Glucose 70 - 99 mg/dL 507  225  750   BUN 6 - 20 mg/dL 14  12  12    Creatinine 0.44 - 1.00 mg/dL  5.18  3.35   Sodium 135 - 145 mmol/L 139  138  141   Potassium 3.5 - 5.1 mmol/L 3.6  4.6  4.2   Chloride 98 - 111 mmol/L 104  99  103   CO2 22 - 32 mmol/L 26  23  22    Calcium 8.9 - 10.3 mg/dL 9.3  9.5  9.2   Total Protein 6.5 - 8.1 g/dL 7.6  7.4    Total Bilirubin 0.3 - 1.2 mg/dL 0.3  8.25    Alkaline Phos 38 - 126 U/L 65  75    AST 15 - 41 U/L 9  12    ALT 0 - 44 U/L 9  12     Lipid Panel     Component Value Date/Time   CHOL 125 08/15/2021 0906   TRIG 139 08/15/2021 0906   HDL 50 08/15/2021 0906   CHOLHDL 2.5 08/15/2021 0906   CHOLHDL 4.4 08/19/2016 0934   VLDL 51 (H) 08/19/2016 0934   LDLCALC 51 08/15/2021 0906    CBC    Component Value Date/Time  WBC  10.8 07/30/2022 0927   RBC 4.26 07/30/2022 0927   HGB 12.4 07/30/2022 0927   HGB 11.4 (L) 10/14/2021 0947   HGB 11.9 08/15/2021 0906   HCT 36.6 07/30/2022 0927   HCT 35.0 08/15/2021 0906   PLT 429 (H) 07/30/2022 0927   PLT 448 (H) 10/14/2021 0947   PLT 508 (H) 08/15/2021 0906   MCV 85.9 07/30/2022 0927   MCV 82 08/15/2021 0906   MCH 29.1 07/30/2022 0927   MCHC 33.9 07/30/2022 0927   RDW 13.1 07/30/2022 0927   RDW 13.9 08/15/2021 0906   LYMPHSABS 5.1 (H) 10/14/2021 0947   LYMPHSABS 4.6 (H) 08/15/2021 0906   MONOABS 0.5 10/14/2021 0947   EOSABS 0.2 10/14/2021 0947   EOSABS 0.2 08/15/2021 0906   BASOSABS 0.1 10/14/2021 0947   BASOSABS 0.1 08/15/2021 0906    ASSESSMENT AND PLAN:  There are no diagnoses linked to this encounter.   Patient was given the opportunity to ask questions.  Patient verbalized understanding of the plan and was able to repeat key elements of the plan. Patient was given clear instructions to go to Emergency Department or return to medical center if symptoms don't improve, worsen, or new problems develop.The patient verbalized understanding.   No orders of the defined types were placed in this encounter.    Requested Prescriptions    No prescriptions requested or ordered in this encounter    No follow-ups on file.  Rema Fendt, NP

## 2022-08-28 ENCOUNTER — Ambulatory Visit: Payer: BC Managed Care – PPO | Admitting: Family

## 2022-09-22 NOTE — H&P (Signed)
Dorothy Haynes is an 37 y.o. female.G1P1 with [facility] that has been refractory to medical management, including, Depo Provera and IUD GYN U/S normal, 46 gms. Pt desires definite therapy.  H/O TSVD x 1 ( 7#) H/O DM, followed by PCP     Menstrual History: Menarche age: 68 No LMP recorded. Patient has had an injection.    Past Medical History:  Diagnosis Date   Anxiety    Back pain    Diabetes ([place])    Diverticulitis 03/15/2018   Gallbladder problem    GERD (gastroesophageal reflux disease) 2009   Gestational diabetes 2008   Headache    History of gallstones 10/2015   s/p cholecystectomy 10/09/15   Hyperlipidemia    Hypertension    Joint pain    Menorrhagia with irregular cycle 10/23/2016   Morbid obesity ([place]) 10/09/2015   Physical deconditioning 06/22/2019   Plantar fasciitis, bilateral 06/02/2019   Shortness of breath dyspnea    Sleep apnea    TMJ (dislocation of temporomandibular joint), initial encounter 06/02/2019   Tobacco abuse 10/09/2015    Past Surgical History:  Procedure Laterality Date   CHOLECYSTECTOMY N/A 10/09/2015   Procedure: LAPAROSCOPIC CHOLECYSTECTOMY ;  Surgeon: Fanny Skates, MD;  Location: [facility];  Service: General;  Laterality: N/A;   WISDOM TOOTH EXTRACTION      Family History  Problem Relation Age of Onset   Drug abuse Mother    Heart disease Mother    Diabetes Mother    Hypertension Mother    Cataracts Mother    Cancer Father    Throat cancer Father    Kidney Stones Father    Alcoholism Father    Drug abuse Father    Diabetes Maternal Grandmother    Diabetes Maternal Grandfather    Blindness Maternal Aunt    Alzheimer's disease Other    Diabetes Other    Hypertension Other    Heart disease Other    Heart disease Other    Diabetes Other    Hypertension Other    Thyroid disease Other    Amblyopia Neg Hx    Glaucoma Neg Hx    Macular degeneration Neg Hx    Retinal detachment Neg Hx    Strabismus Neg Hx    Retinitis pigmentosa  Neg Hx     Social History:  reports that she quit smoking about 4 years ago. Her smoking use included cigarettes. She has a 3.40 pack-year smoking history. She has never used smokeless tobacco. She reports current alcohol use. She reports that she does not use drugs.  Allergies: No Known Allergies  No medications prior to admission.    Review of Systems  Constitutional: Negative.   Respiratory: Negative.    Cardiovascular: Negative.   Gastrointestinal: Negative.   Genitourinary:  Positive for menstrual problem.    There were no vitals taken for this visit. Physical Exam Constitutional:      Appearance: Normal appearance.  Cardiovascular:     Rate and Rhythm: Normal rate and regular rhythm.  Pulmonary:     Effort: Pulmonary effort is normal.     Breath sounds: Normal breath sounds.  Abdominal:     General: Bowel sounds are normal.     Palpations: Abdomen is soft.  Genitourinary:    Comments: Nl EGBUS, uterus  small, < 8 weeks, mobile, non tender, no masses    No results found for this or any previous visit (from the past 24 hour(s)).  No results found.  Assessment/Plan: [facility]  TVH with possible salpingectomy reviewed with pt. R/B/Post op care discussed. Pt has verbalized understanding and desires to proceed.  Chancy Milroy 09/22/2022, 11:09 AM

## 2022-09-23 DIAGNOSIS — I10 Essential (primary) hypertension: Secondary | ICD-10-CM | POA: Diagnosis not present

## 2022-09-23 DIAGNOSIS — Z6841 Body Mass Index (BMI) 40.0 and over, adult: Secondary | ICD-10-CM | POA: Diagnosis not present

## 2022-09-23 DIAGNOSIS — Z0000 Encounter for general adult medical examination without abnormal findings: Secondary | ICD-10-CM | POA: Diagnosis not present

## 2022-09-23 DIAGNOSIS — E1169 Type 2 diabetes mellitus with other specified complication: Secondary | ICD-10-CM | POA: Diagnosis not present

## 2022-09-24 DIAGNOSIS — G4733 Obstructive sleep apnea (adult) (pediatric): Secondary | ICD-10-CM | POA: Diagnosis not present

## 2022-09-24 NOTE — Pre-Procedure Instructions (Signed)
Surgical Instructions    Your procedure is scheduled on Tuesday 09/30/22.   Report to [facility] Main Entrance "A" at 10:05 A.M., then check in with the Admitting office.  Call this number if you have problems the morning of surgery:  [phone]   If you have any questions prior to your surgery date call [phone]: Open Monday-Friday 8am-4pm If you experience any cold or flu symptoms such as cough, fever, chills, shortness of breath, etc. between now and your scheduled surgery, please notify us at the above number     Remember:  Do not eat after midnight the night before your surgery  You may drink clear liquids until 09:00 A.M. the morning of your surgery.   Clear liquids allowed are: Water, Non-Citrus Juices (without pulp), Carbonated Beverages, Clear Tea, Black Coffee ONLY (NO MILK, CREAM OR POWDERED CREAMER of any kind), and Gatorade    Take these medicines the morning of surgery with A SIP OF WATER:   cetirizine (ZYRTEC)    Take these medicines if needed:   VENTOLIN HFA 108 (90 Base)    As of today, STOP taking any Aspirin (unless otherwise instructed by your surgeon) Aleve, Naproxen, Ibuprofen, Motrin, Advil, Goody's, BC's, all herbal medications, fish oil, and all vitamins.  WHAT DO I DO ABOUT MY DIABETES MEDICATION?   Do not take oral diabetes medicines (pills) the morning of surgery.  DO NOT TAKE MOUNJARO one week prior to surgery. Last dose should be taken on 09/20/22.  The day of surgery, do not take other diabetes injectables, including Byetta (exenatide), Bydureon (exenatide ER), Victoza (liraglutide), or Trulicity (dulaglutide).  HOW TO MANAGE YOUR DIABETES BEFORE AND AFTER SURGERY  Why is it important to control my blood sugar before and after surgery? Improving blood sugar levels before and after surgery helps healing and can limit problems. A way of improving blood sugar control is eating a healthy diet by:  Eating less sugar and carbohydrates   Increasing activity/exercise  Talking with your doctor about reaching your blood sugar goals High blood sugars (greater than 180 mg/dL) can raise your risk of infections and slow your recovery, so you will need to focus on controlling your diabetes during the weeks before surgery. Make sure that the doctor who takes care of your diabetes knows about your planned surgery including the date and location.  How do I manage my blood sugar before surgery? Check your blood sugar at least 4 times a day, starting 2 days before surgery, to make sure that the level is not too high or low.  Check your blood sugar the morning of your surgery when you wake up and every 2 hours until you get to the Short Stay unit.  If your blood sugar is less than 70 mg/dL, you will need to treat for low blood sugar: Do not take insulin. Treat a low blood sugar (less than 70 mg/dL) with  cup of clear juice (cranberry or apple), 4 glucose tablets, OR glucose gel. Recheck blood sugar in 15 minutes after treatment (to make sure it is greater than 70 mg/dL). If your blood sugar is not greater than 70 mg/dL on recheck, call [phone] for further instructions. Report your blood sugar to the short stay nurse when you get to Short Stay.  If you are admitted to the hospital after surgery: Your blood sugar will be checked by the staff and you will probably be given insulin after surgery (instead of oral diabetes medicines) to make sure you have good  blood sugar levels. The goal for blood sugar control after surgery is 80-180 mg/dL.            Do not wear jewelry or makeup. Do not wear lotions, powders, perfumes/cologne or deodorant. Do not shave 48 hours prior to surgery.  Men may shave face and neck. Do not bring valuables to the hospital. Do not wear nail polish, gel polish, artificial nails, or any other type of covering on natural nails (fingers and toes) If you have artificial nails or gel coating that need to be removed  by a nail salon, please have this removed prior to surgery. Artificial nails or gel coating may interfere with anesthesia's ability to adequately monitor your vital signs.  [facility] is not responsible for any belongings or valuables.    Do NOT Smoke (Tobacco/Vaping)  24 hours prior to your procedure  If you use a CPAP at night, you may bring your mask for your overnight stay.   Contacts, glasses, hearing aids, dentures or partials may not be worn into surgery, please bring cases for these belongings   For patients admitted to the hospital, discharge time will be determined by your treatment team.   Patients discharged the day of surgery will not be allowed to drive home, and someone needs to stay with them for 24 hours.   SURGICAL WAITING ROOM VISITATION Patients having surgery or a procedure may have no more than 2 support people in the waiting area - these visitors may rotate.   Children under the age of 56 must have an adult with them who is not the patient. If the patient needs to stay at the hospital during part of their recovery, the visitor guidelines for inpatient rooms apply. Pre-op nurse will coordinate an appropriate time for 1 support person to accompany patient in pre-op.  This support person may not rotate.   Please refer to [email or web address] for the visitor guidelines for Inpatients (after your surgery is over and you are in a regular room).    Special instructions:    Oral Hygiene is also important to reduce your risk of infection.  Remember - BRUSH YOUR TEETH THE MORNING OF SURGERY WITH YOUR REGULAR TOOTHPASTE   Dorothy Haynes- Preparing For Surgery  Before surgery, you can play an important role. Because skin is not sterile, your skin needs to be as free of germs as possible. You can reduce the number of germs on your skin by washing with CHG (chlorahexidine gluconate) Soap before surgery.  CHG is an antiseptic  cleaner which kills germs and bonds with the skin to continue killing germs even after washing.     Please do not use if you have an allergy to CHG or antibacterial soaps. If your skin becomes reddened/irritated stop using the CHG.  Do not shave (including legs and underarms) for at least 48 hours prior to first CHG shower. It is OK to shave your face.  Please follow these instructions carefully.     Shower the NIGHT BEFORE SURGERY and the MORNING OF SURGERY with CHG Soap.   If you chose to wash your hair, wash your hair first as usual with your normal shampoo. After you shampoo, rinse your hair and body thoroughly to remove the shampoo.  Then [employer] and genitals (private parts) with your normal soap and rinse thoroughly to remove soap.  After that Use CHG Soap as you would any other liquid soap. You can apply CHG directly to the skin and wash gently with  a scrungie or a clean washcloth.   Apply the CHG Soap to your body ONLY FROM THE NECK DOWN.  Do not use on open wounds or open sores. Avoid contact with your eyes, ears, mouth and genitals (private parts). Wash Face and genitals (private parts)  with your normal soap.   Wash thoroughly, paying special attention to the area where your surgery will be performed.  Thoroughly rinse your body with warm water from the neck down.  DO NOT shower/wash with your normal soap after using and rinsing off the CHG Soap.  Pat yourself dry with a CLEAN TOWEL.  Wear CLEAN PAJAMAS to bed the night before surgery  Place CLEAN SHEETS on your bed the night before your surgery  DO NOT SLEEP WITH PETS.   Day of Surgery:  Take a shower with CHG soap. Wear Clean/Comfortable clothing the morning of surgery Do not apply any deodorants/lotions.   Remember to brush your teeth WITH YOUR REGULAR TOOTHPASTE.    If you received a COVID test during your pre-op visit, it is requested that you wear a mask when out in public, stay away from anyone that may not  be feeling well, and notify your surgeon if you develop symptoms. If you have been in contact with anyone that has tested positive in the last 10 days, please notify your surgeon.    Please read over the following fact sheets that you were given.

## 2022-09-25 ENCOUNTER — Encounter (HOSPITAL_COMMUNITY): Payer: Self-pay

## 2022-09-25 ENCOUNTER — Encounter (HOSPITAL_COMMUNITY)
Admission: RE | Admit: 2022-09-25 | Discharge: 2022-09-25 | Disposition: A | Discharge status: Home / Self Care | Payer: Medicaid Other | Source: Ambulatory Visit | Attending: Obstetrics and Gynecology | Admitting: Obstetrics and Gynecology

## 2022-09-25 ENCOUNTER — Other Ambulatory Visit: Payer: Self-pay

## 2022-09-25 VITALS — BP 144/92 | HR 86 | Temp 98.1°F | Resp 18 | Ht 63.0 in | Wt 256.7 lb

## 2022-09-25 DIAGNOSIS — Z01818 Encounter for other preprocedural examination: Secondary | ICD-10-CM | POA: Insufficient documentation

## 2022-09-25 DIAGNOSIS — N938 Other specified abnormal uterine and vaginal bleeding: Secondary | ICD-10-CM

## 2022-09-25 DIAGNOSIS — E119 Type 2 diabetes mellitus without complications: Secondary | ICD-10-CM | POA: Diagnosis not present

## 2022-09-25 LAB — CBC
HCT: 36.8 % (ref 36.0–46.0)
Hemoglobin: 12 g/dL (ref 12.0–15.0)
MCH: 29.1 pg (ref 26.0–34.0)
MCHC: 32.6 g/dL (ref 30.0–36.0)
MCV: 89.3 fL (ref 80.0–100.0)
Platelets: 516 10*3/uL — ABNORMAL HIGH (ref 150–400)
RBC: 4.12 MIL/uL (ref 3.87–5.11)
RDW: 13.2 % (ref 11.5–15.5)
WBC: 12 10*3/uL — ABNORMAL HIGH (ref 4.0–10.5)
nRBC: 0 % (ref 0.0–0.2)

## 2022-09-25 LAB — BASIC METABOLIC PANEL
Anion gap: 11 (ref 5–15)
BUN: 9 mg/dL (ref 6–20)
CO2: 24 mmol/L (ref 22–32)
Calcium: 9.3 mg/dL (ref 8.9–10.3)
Chloride: 103 mmol/L (ref 98–111)
Creatinine, Ser: 0.72 mg/dL (ref 0.44–1.00)
GFR, Estimated: >60 mL/min (ref >60)
Glucose, Bld: 176 mg/dL — ABNORMAL HIGH (ref 70–99)
Potassium: 3.4 mmol/L — ABNORMAL LOW (ref 3.5–5.1)
Sodium: 138 mmol/L (ref 135–145)

## 2022-09-25 LAB — GLUCOSE, CAPILLARY: Glucose-Capillary: 186 mg/dL — ABNORMAL HIGH (ref 70–99)

## 2022-09-25 LAB — HEMOGLOBIN A1C
Hgb A1c MFr Bld: 7.7 % — ABNORMAL HIGH (ref 4.8–5.6)
Mean Plasma Glucose: 174.29 mg/dL

## 2022-09-25 LAB — TYPE AND SCREEN
ABO/RH(D): O POS
Antibody Screen: NEGATIVE

## 2022-09-25 NOTE — Progress Notes (Signed)
PCP - amelia wilson Cardiologist - denies  PPM/ICD - denies  Chest x-ray - n/a EKG - 09/25/22 Stress Test - 2020 ECHO - denies Cardiac Cath - denies  Sleep Study - +OSA CPAP - does not wear nightly  Fasting Blood Sugar - unknown Does not check CBG at home  Last dose of GLP1 agonist-  Mounjaro 09/20/22 GLP1 instructions: stop one week prior to surgery   ERAS Protcol -yes PRE-SURGERY Ensure or G2- none ordered  COVID TEST- not needed   Anesthesia review: yes, review stress test  Patient denies shortness of breath, fever, cough and chest pain at PAT appointment   All instructions explained to the patient, with a verbal understanding of the material. Patient agrees to go over the instructions while at home for a better understanding. Patient also instructed to self quarantine after being tested for COVID-19. The opportunity to ask questions was provided.

## 2022-09-29 NOTE — Progress Notes (Signed)
Pt made aware of surgery time change 1045-1220, arrival 0845, and to stop drinking clear liquids by 0745.

## 2022-09-30 ENCOUNTER — Observation Stay (HOSPITAL_COMMUNITY): Payer: Medicaid Other | Admitting: Physician Assistant

## 2022-09-30 ENCOUNTER — Observation Stay (HOSPITAL_COMMUNITY)
Admission: RE | Admit: 2022-09-30 | Discharge: 2022-10-01 | Disposition: A | Discharge status: Home / Self Care | Payer: Medicaid Other | Source: Ambulatory Visit | Attending: Obstetrics and Gynecology | Admitting: Obstetrics and Gynecology

## 2022-09-30 ENCOUNTER — Observation Stay (HOSPITAL_BASED_OUTPATIENT_CLINIC_OR_DEPARTMENT_OTHER): Payer: Medicaid Other | Admitting: Physician Assistant

## 2022-09-30 ENCOUNTER — Encounter (HOSPITAL_COMMUNITY): Payer: Self-pay | Admitting: Obstetrics and Gynecology

## 2022-09-30 ENCOUNTER — Other Ambulatory Visit: Payer: Self-pay

## 2022-09-30 ENCOUNTER — Encounter (HOSPITAL_COMMUNITY)
Admission: RE | Disposition: A | Discharge status: Home / Self Care | Payer: Self-pay | Source: Ambulatory Visit | Attending: Obstetrics and Gynecology

## 2022-09-30 DIAGNOSIS — F419 Anxiety disorder, unspecified: Secondary | ICD-10-CM

## 2022-09-30 DIAGNOSIS — Z01818 Encounter for other preprocedural examination: Secondary | ICD-10-CM

## 2022-09-30 DIAGNOSIS — Z87891 Personal history of nicotine dependence: Secondary | ICD-10-CM | POA: Diagnosis not present

## 2022-09-30 DIAGNOSIS — E119 Type 2 diabetes mellitus without complications: Secondary | ICD-10-CM

## 2022-09-30 DIAGNOSIS — N8003 Adenomyosis of the uterus: Secondary | ICD-10-CM

## 2022-09-30 DIAGNOSIS — N938 Other specified abnormal uterine and vaginal bleeding: Secondary | ICD-10-CM | POA: Diagnosis not present

## 2022-09-30 DIAGNOSIS — N920 Excessive and frequent menstruation with regular cycle: Principal | ICD-10-CM | POA: Insufficient documentation

## 2022-09-30 DIAGNOSIS — I10 Essential (primary) hypertension: Secondary | ICD-10-CM | POA: Insufficient documentation

## 2022-09-30 DIAGNOSIS — Z9989 Dependence on other enabling machines and devices: Secondary | ICD-10-CM | POA: Diagnosis not present

## 2022-09-30 DIAGNOSIS — Z9889 Other specified postprocedural states: Secondary | ICD-10-CM

## 2022-09-30 DIAGNOSIS — G4733 Obstructive sleep apnea (adult) (pediatric): Secondary | ICD-10-CM | POA: Diagnosis not present

## 2022-09-30 HISTORY — PX: VAGINAL HYSTERECTOMY: SHX2639

## 2022-09-30 LAB — ABO/RH: ABO/RH(D): O POS

## 2022-09-30 LAB — GLUCOSE, CAPILLARY
Glucose-Capillary: 165 mg/dL — ABNORMAL HIGH (ref 70–99)
Glucose-Capillary: 154 mg/dL — ABNORMAL HIGH (ref 70–99)
Glucose-Capillary: 131 mg/dL — ABNORMAL HIGH (ref 70–99)

## 2022-09-30 LAB — POCT PREGNANCY, URINE: Preg Test, Ur: NEGATIVE

## 2022-09-30 SURGERY — HYSTERECTOMY, VAGINAL
Anesthesia: General | Site: Vagina | Laterality: Bilateral

## 2022-09-30 MED ORDER — ONDANSETRON HCL 4 MG PO TABS: 4.0000 mg | ORAL_TABLET | Freq: Four times a day (QID) | ORAL | Status: DC | PRN

## 2022-09-30 MED ORDER — ONDANSETRON HCL 4 MG/2ML IJ SOLN
INTRAMUSCULAR | Status: DC | PRN
  Administered 2022-09-30: 4 mg via INTRAVENOUS

## 2022-09-30 MED ORDER — KETOROLAC TROMETHAMINE 15 MG/ML IJ SOLN
15.0000 mg | INTRAMUSCULAR | Status: AC
  Administered 2022-09-30: 15 mg via INTRAVENOUS
  Filled 2022-09-30: qty 1

## 2022-09-30 MED ORDER — POVIDONE-IODINE 10 % EX SWAB
2.0000 | Freq: Once | CUTANEOUS | Status: AC
  Administered 2022-09-30: 2 via TOPICAL

## 2022-09-30 MED ORDER — FENTANYL CITRATE (PF) 250 MCG/5ML IJ SOLN
INTRAMUSCULAR | Status: AC
  Filled 2022-09-30: qty 5

## 2022-09-30 MED ORDER — DEXAMETHASONE SODIUM PHOSPHATE 10 MG/ML IJ SOLN
INTRAMUSCULAR | Status: AC
  Filled 2022-09-30: qty 1

## 2022-09-30 MED ORDER — DEXAMETHASONE SODIUM PHOSPHATE 10 MG/ML IJ SOLN
INTRAMUSCULAR | Status: DC | PRN
  Administered 2022-09-30: 10 mg via INTRAVENOUS

## 2022-09-30 MED ORDER — ACETAMINOPHEN 500 MG PO TABS
1000.0000 mg | ORAL_TABLET | ORAL | Status: AC
  Administered 2022-09-30: 1000 mg via ORAL
  Filled 2022-09-30: qty 2

## 2022-09-30 MED ORDER — PROPOFOL 10 MG/ML IV BOLUS
INTRAVENOUS | Status: AC
  Filled 2022-09-30: qty 20

## 2022-09-30 MED ORDER — LACTATED RINGERS IV SOLN: INTRAVENOUS | Status: DC

## 2022-09-30 MED ORDER — OXYCODONE-ACETAMINOPHEN 5-325 MG PO TABS: 1.0000 | ORAL_TABLET | ORAL | Status: DC | PRN

## 2022-09-30 MED ORDER — HYDROMORPHONE HCL 1 MG/ML IJ SOLN
0.2500 mg | INTRAMUSCULAR | Status: DC | PRN
  Administered 2022-09-30 (×2): 0.5 mg via INTRAVENOUS

## 2022-09-30 MED ORDER — KETOROLAC TROMETHAMINE 30 MG/ML IJ SOLN
INTRAMUSCULAR | Status: DC | PRN
  Administered 2022-09-30: 15 mg via INTRAVENOUS

## 2022-09-30 MED ORDER — ONDANSETRON HCL 4 MG/2ML IJ SOLN: 4.0000 mg | Freq: Once | INTRAMUSCULAR | Status: DC | PRN

## 2022-09-30 MED ORDER — OXYCODONE-ACETAMINOPHEN 5-325 MG PO TABS
2.0000 | ORAL_TABLET | ORAL | Status: DC | PRN
  Administered 2022-09-30: 2 via ORAL
  Filled 2022-09-30: qty 2

## 2022-09-30 MED ORDER — PHENYLEPHRINE 80 MCG/ML (10ML) SYRINGE FOR IV PUSH (FOR BLOOD PRESSURE SUPPORT)
PREFILLED_SYRINGE | INTRAVENOUS | Status: DC | PRN
  Administered 2022-09-30 (×2): 160 ug via INTRAVENOUS
  Administered 2022-09-30: 80 ug via INTRAVENOUS
  Administered 2022-09-30 (×3): 160 ug via INTRAVENOUS

## 2022-09-30 MED ORDER — FENTANYL CITRATE (PF) 250 MCG/5ML IJ SOLN
INTRAMUSCULAR | Status: DC | PRN
  Administered 2022-09-30 (×2): 100 ug via INTRAVENOUS
  Administered 2022-09-30: 50 ug via INTRAVENOUS

## 2022-09-30 MED ORDER — IBUPROFEN 800 MG PO TABS
800.0000 mg | ORAL_TABLET | Freq: Three times a day (TID) | ORAL | Status: DC
  Filled 2022-09-30: qty 1

## 2022-09-30 MED ORDER — SOD CITRATE-CITRIC ACID 500-334 MG/5ML PO SOLN
30.0000 mL | ORAL | Status: AC
  Administered 2022-09-30: 30 mL via ORAL
  Filled 2022-09-30: qty 30

## 2022-09-30 MED ORDER — OXYCODONE HCL 5 MG PO TABS: 5.0000 mg | ORAL_TABLET | Freq: Once | ORAL | Status: DC | PRN

## 2022-09-30 MED ORDER — ONDANSETRON HCL 4 MG/2ML IJ SOLN: 4.0000 mg | Freq: Four times a day (QID) | INTRAMUSCULAR | Status: DC | PRN

## 2022-09-30 MED ORDER — EPHEDRINE SULFATE-NACL 50-0.9 MG/10ML-% IV SOSY
PREFILLED_SYRINGE | INTRAVENOUS | Status: DC | PRN
  Administered 2022-09-30: 10 mg via INTRAVENOUS

## 2022-09-30 MED ORDER — LOSARTAN POTASSIUM 25 MG PO TABS
25.0000 mg | ORAL_TABLET | Freq: Every day | ORAL | Status: DC
  Administered 2022-09-30: 25 mg via ORAL
  Filled 2022-09-30 (×2): qty 1

## 2022-09-30 MED ORDER — SIMETHICONE 80 MG PO CHEW: 80.0000 mg | CHEWABLE_TABLET | Freq: Four times a day (QID) | ORAL | Status: DC | PRN

## 2022-09-30 MED ORDER — ROSUVASTATIN CALCIUM 5 MG PO TABS
10.0000 mg | ORAL_TABLET | Freq: Every day | ORAL | Status: DC
  Administered 2022-09-30: 10 mg via ORAL
  Filled 2022-09-30 (×2): qty 2

## 2022-09-30 MED ORDER — CHLORHEXIDINE GLUCONATE 0.12 % MT SOLN
15.0000 mL | Freq: Once | OROMUCOSAL | Status: AC
  Administered 2022-09-30: 15 mL via OROMUCOSAL
  Filled 2022-09-30: qty 15

## 2022-09-30 MED ORDER — KETOROLAC TROMETHAMINE 30 MG/ML IJ SOLN
30.0000 mg | Freq: Four times a day (QID) | INTRAMUSCULAR | Status: DC
  Administered 2022-09-30 – 2022-10-01 (×3): 30 mg via INTRAVENOUS
  Filled 2022-09-30 (×3): qty 1

## 2022-09-30 MED ORDER — LIDOCAINE 2% (20 MG/ML) 5 ML SYRINGE
INTRAMUSCULAR | Status: DC | PRN
  Administered 2022-09-30: 60 mg via INTRAVENOUS

## 2022-09-30 MED ORDER — ZOLPIDEM TARTRATE 5 MG PO TABS: 5.0000 mg | ORAL_TABLET | Freq: Every evening | ORAL | Status: DC | PRN

## 2022-09-30 MED ORDER — PHENYLEPHRINE 80 MCG/ML (10ML) SYRINGE FOR IV PUSH (FOR BLOOD PRESSURE SUPPORT)
PREFILLED_SYRINGE | INTRAVENOUS | Status: AC
  Filled 2022-09-30: qty 10

## 2022-09-30 MED ORDER — LIDOCAINE 2% (20 MG/ML) 5 ML SYRINGE
INTRAMUSCULAR | Status: AC
  Filled 2022-09-30: qty 5

## 2022-09-30 MED ORDER — HYDROMORPHONE HCL 1 MG/ML IJ SOLN
INTRAMUSCULAR | Status: AC
  Filled 2022-09-30: qty 1

## 2022-09-30 MED ORDER — ACETAMINOPHEN 500 MG PO TABS
ORAL_TABLET | ORAL | Status: AC
  Filled 2022-09-30: qty 1

## 2022-09-30 MED ORDER — MIDAZOLAM HCL 2 MG/2ML IJ SOLN
INTRAMUSCULAR | Status: AC
  Filled 2022-09-30: qty 2

## 2022-09-30 MED ORDER — ONDANSETRON HCL 4 MG/2ML IJ SOLN
INTRAMUSCULAR | Status: AC
  Filled 2022-09-30: qty 2

## 2022-09-30 MED ORDER — CEFAZOLIN SODIUM-DEXTROSE 2-4 GM/100ML-% IV SOLN
2.0000 g | INTRAVENOUS | Status: AC
  Administered 2022-09-30: 2 g via INTRAVENOUS
  Filled 2022-09-30: qty 100

## 2022-09-30 MED ORDER — PROPOFOL 10 MG/ML IV BOLUS
INTRAVENOUS | Status: DC | PRN
  Administered 2022-09-30: 200 mg via INTRAVENOUS

## 2022-09-30 MED ORDER — ROCURONIUM BROMIDE 10 MG/ML (PF) SYRINGE
PREFILLED_SYRINGE | INTRAVENOUS | Status: DC | PRN
  Administered 2022-09-30: 70 mg via INTRAVENOUS

## 2022-09-30 MED ORDER — OXYCODONE HCL 5 MG/5ML PO SOLN: 5.0000 mg | Freq: Once | ORAL | Status: DC | PRN

## 2022-09-30 MED ORDER — SENNA 8.6 MG PO TABS
1.0000 | ORAL_TABLET | Freq: Two times a day (BID) | ORAL | Status: DC
  Administered 2022-09-30 – 2022-10-01 (×2): 8.6 mg via ORAL
  Filled 2022-09-30 (×2): qty 1

## 2022-09-30 MED ORDER — INSULIN ASPART 100 UNIT/ML IJ SOLN: 0.0000 [IU] | INTRAMUSCULAR | Status: DC | PRN

## 2022-09-30 MED ORDER — SUGAMMADEX SODIUM 200 MG/2ML IV SOLN
INTRAVENOUS | Status: DC | PRN
  Administered 2022-09-30: 400 mg via INTRAVENOUS

## 2022-09-30 MED ORDER — MIDAZOLAM HCL 2 MG/2ML IJ SOLN
INTRAMUSCULAR | Status: DC | PRN
  Administered 2022-09-30: 2 mg via INTRAVENOUS

## 2022-09-30 MED ORDER — ROCURONIUM BROMIDE 10 MG/ML (PF) SYRINGE
PREFILLED_SYRINGE | INTRAVENOUS | Status: AC
  Filled 2022-09-30: qty 10

## 2022-09-30 MED ORDER — HYDROMORPHONE HCL 1 MG/ML IJ SOLN
1.0000 mg | INTRAMUSCULAR | Status: DC | PRN
  Administered 2022-09-30: 1 mg via INTRAVENOUS
  Filled 2022-09-30: qty 1

## 2022-09-30 MED ORDER — 0.9 % SODIUM CHLORIDE (POUR BTL) OPTIME
TOPICAL | Status: DC | PRN
  Administered 2022-09-30: 1000 mL

## 2022-09-30 MED ORDER — ORAL CARE MOUTH RINSE: 15.0000 mL | Freq: Once | OROMUCOSAL | Status: AC

## 2022-09-30 SURGICAL SUPPLY — 29 items
BAG COUNTER SPONGE SURGICOUNT (BAG) ×2 IMPLANT
BAG SPNG CNTER NS LX DISP (BAG) ×1
BLADE SURG 10 STRL SS (BLADE) ×2 IMPLANT
GAUZE 4X4 16PLY ~~LOC~~+RFID DBL (SPONGE) ×4 IMPLANT
GLOVE BIO SURGEON STRL SZ 6.5 (GLOVE) IMPLANT
GLOVE BIO SURGEON STRL SZ7.5 (GLOVE) ×2 IMPLANT
GLOVE BIOGEL PI IND STRL 8 (GLOVE) ×2 IMPLANT
GLOVE SURG ORTHO 8.0 STRL STRW (GLOVE) ×2 IMPLANT
GLOVE SURG SS PI 6.5 STRL IVOR (GLOVE) IMPLANT
GLOVE SURG SS PI 7.5 STRL IVOR (GLOVE) IMPLANT
GLOVE SURG UNDER POLY LF SZ7 (GLOVE) ×2 IMPLANT
GOWN STRL REUS W/ TWL LRG LVL3 (GOWN DISPOSABLE) ×2 IMPLANT
GOWN STRL REUS W/ TWL XL LVL3 (GOWN DISPOSABLE) ×4 IMPLANT
GOWN STRL REUS W/TWL LRG LVL3 (GOWN DISPOSABLE) ×1
GOWN STRL REUS W/TWL XL LVL3 (GOWN DISPOSABLE) ×2
HIBICLENS CHG 4% 4OZ BTL (MISCELLANEOUS) ×2 IMPLANT
KIT TURNOVER KIT B (KITS) ×2 IMPLANT
MANIFOLD NEPTUNE II (INSTRUMENTS) ×2 IMPLANT
NS IRRIG 1000ML POUR BTL (IV SOLUTION) ×2 IMPLANT
PACK VAGINAL WOMENS (CUSTOM PROCEDURE TRAY) ×2 IMPLANT
PAD OB MATERNITY 4.3X12.25 (PERSONAL CARE ITEMS) ×2 IMPLANT
SUT VIC AB 2-0 CT1 18 (SUTURE) ×2 IMPLANT
SUT VIC AB 2-0 CT1 27 (SUTURE) ×1
SUT VIC AB 2-0 CT1 TAPERPNT 27 (SUTURE) ×2 IMPLANT
SUT VIC AB PLUS 45CM 1-MO-4 (SUTURE) ×4 IMPLANT
SUT VICRYL 1 TIES 12X18 (SUTURE) ×2 IMPLANT
TOWEL GREEN STERILE FF (TOWEL DISPOSABLE) ×2 IMPLANT
TRAY FOLEY W/BAG SLVR 14FR (SET/KITS/TRAYS/PACK) ×2 IMPLANT
UNDERPAD 30X36 HEAVY ABSORB (UNDERPADS AND DIAPERS) ×2 IMPLANT

## 2022-09-30 NOTE — Anesthesia Postprocedure Evaluation (Signed)
Anesthesia Post Note  Patient: Dorothy Haynes  Procedure(s) Performed: HYSTERECTOMY VAGINAL WITH SALPINGECTOMY (Bilateral: Vagina )     Patient location during evaluation: PACU Anesthesia Type: General Level of consciousness: awake and alert and oriented Pain management: pain level controlled Vital Signs Assessment: post-procedure vital signs reviewed and stable Respiratory status: spontaneous breathing, nonlabored ventilation and respiratory function stable Cardiovascular status: blood pressure returned to baseline and stable Postop Assessment: no apparent nausea or vomiting Anesthetic complications: no   No notable events documented.  Last Vitals:  Vitals:   09/30/22 1315 09/30/22 1330  BP: 110/70 (!) 107/59  Pulse: 94 94  Resp: 20 20  Temp:    SpO2: 97% 99%    Last Pain:  Vitals:   09/30/22 1330  PainSc: 10-Worst pain ever                 Ayham Word A.

## 2022-09-30 NOTE — Discharge Summary (Signed)
Physician Discharge Summary  Patient ID: Dorothy Haynes MRN: [id number] DOB/AGE: August 05, 1986 37 y.o.  Admit date: 09/30/2022 Discharge date: 10/01/2022  Admission Diagnoses: Menorrhagia  Discharge Diagnoses:  Principal Problem:   Post-operative state   Discharged Condition: good  Hospital Course: Ms Hamblett was admitted with above Dx. She underwent TVH with BS on 09/30/22 without problems. See OP note for additional information. Post op course was unremarkable. She progressed to ambulating, tolerating diet, voiding and good oral pain control Felt amendable for discharge home. Discharge instructions, medications, and follow up reviewed with pt. Pt verbalized understanding  Consults: None  Significant Diagnostic Studies: Labs  Treatments: surgery: TVH with BS  Discharge Exam: Blood pressure 104/65, pulse (!) 107, temperature 98.8 F (37.1 C), temperature source Oral, resp. rate 16, height 5\' 3"  (1.6 m), weight 113.4 kg, SpO2 97 %. Lungs clear Heart RRR Abd soft + BS GU no bleeding Ext non tender  Disposition: Discharge disposition: 01-Home or Self Care       Discharge Instructions     Call MD for:  difficulty breathing, headache or visual disturbances   Complete by: As directed    Call MD for:  extreme fatigue   Complete by: As directed    Call MD for:  hives   Complete by: As directed    Call MD for:  persistant dizziness or light-headedness   Complete by: As directed    Call MD for:  persistant nausea and vomiting   Complete by: As directed    Call MD for:  redness, tenderness, or signs of infection (pain, swelling, redness, odor or green/yellow discharge around incision site)   Complete by: As directed    Call MD for:  severe uncontrolled pain   Complete by: As directed    Call MD for:  temperature >100.4   Complete by: As directed    Diet - low sodium heart healthy   Complete by: As directed    Increase activity slowly   Complete by: As directed    Sexual  Activity Restrictions   Complete by: As directed    Pelvic rest x 4 weeks      Allergies as of 10/01/2022   No Known Allergies      Medication List     STOP taking these medications    ferrous sulfate 325 (65 FE) MG tablet       TAKE these medications    calcium-vitamin D 500-200 MG-UNIT tablet Commonly known as: OSCAL WITH D Take 1 tablet by mouth 2 (two) times daily.   cetirizine 10 MG tablet Commonly known as: ZYRTEC Take 1 tablet by mouth once daily   ibuprofen 800 MG tablet Commonly known as: ADVIL Take 1 tablet (800 mg total) by mouth 3 (three) times daily.   losartan 25 MG tablet Commonly known as: COZAAR TAKE 1 TABLET BY MOUTH AT BEDTIME   Mounjaro 15 MG/0.5ML Pen Generic drug: tirzepatide Inject 15 mg into the skin every Saturday.   oxyCODONE-acetaminophen 5-325 MG tablet Commonly known as: PERCOCET/ROXICET Take 1 tablet by mouth every 4 (four) hours as needed for moderate pain.   rosuvastatin 10 MG tablet Commonly known as: CRESTOR Take 10 mg by mouth at bedtime.   Ventolin HFA 108 (90 Base) MCG/ACT inhaler Generic drug: albuterol Inhale 1-2 puffs into the lungs every 4 (four) hours as needed for shortness of breath.        Follow-up Information     [facility]. Schedule an appointment as soon as possible for a visit in 4 week(s).   Specialty: Obstetrics and Gynecology Contact information: [address] [place] [address] [phone]                Signed: Hermina Staggers 10/01/2022, 9:16 AM

## 2022-09-30 NOTE — Op Note (Signed)
Dorothy Haynes UDJSHF PROCEDURE DATE: 09/30/2022  PREOPERATIVE DIAGNOSIS:   Menorrhagia POSTOPERATIVE DIAGNOSIS:  Menorrhagia SURGEON:  Arlina Robes, M.D. ASSISTANT: Eloy End, M.D.  An experienced assistant was required given the standard of surgical care given the complexity of the case.  This assistant was needed for exposure, dissection, suctioning, retraction, and for overall help during the procedure.   OPERATION:  Total Vaginal hysterectomy with Bilateral salpingectomy ANESTHESIA:  General endotracheal.  INDICATIONS: The patient is a 37 y.o. G1P0 with history of symptomatic uterine fibroids/menorrhagia. The patient made a decision to undergo definite surgical treatment. On the preoperative visit, the risks, benefits, indications, and alternatives of the procedure were reviewed with the patient.  On the day of surgery, the risks of surgery were again discussed with the patient including but not limited to: bleeding which may require transfusion or reoperation; infection which may require antibiotics; injury to bowel, bladder, ureters or other surrounding organs; need for additional procedures; thromboembolic phenomenon, incisional problems and other postoperative/anesthesia complications. Written informed consent was obtained.    OPERATIVE FINDINGS: A 5 week size uterus with normal tubes and ovaries bilaterally.  ESTIMATED BLOOD LOSS: 50 ml FLUIDS: As recorded URINE OUTPUT:  As recorded SPECIMENS:  Uterus and cervix and tubes sent to pathology COMPLICATIONS:  None immediate.  DESCRIPTION OF PROCEDURE:  The patient received intravenous antibiotics and had sequential compression devices applied to her lower extremities while in the preoperative area.  She was then taken to the operating room where general anesthesia was administered and was found to be adequate.  She was placed in the dorsal lithotomy position, and was prepped and draped in a sterile manner.  A Foley catheter was inserted  into her bladder and attached to constant drainage. After an adequate timeout was performed, attention was turned to her pelvis.  A weighted speculum was then placed in the vagina, and the  posterior lip of the cervix was grasped with tenaculum. The posterior cul de sac was then entered sharply without problems. Long bill weighted speculum was placed.   The anterior lip of cervix was grasped in a similar fashion.  The cervix was then circumferentially incised, and the bladder was dissected off the pubocervical fascia anteriorly without complication.  The anterior cul-de-sac was then entered sharply without difficulty and a retractor was placed.   Zeplin clamps were then used to clamp the uterosacral ligaments on either side.  They were then cut and sutured ligated with 0 Vicryl.  Of note, all sutures used in this case were 0 Vicryl unless otherwise noted.   The cardinal ligaments were then clamped, cut and ligated. The uterine vessels and broad ligaments were then serially clamped with the Heaney clamps, cut, and suture ligated on both sides. The final pedicle involving the uteroovarian was clamped, cut, suture ligated with a free tie and the 0 Vicryl in a [place] fashion.   The was repeated on the opposite as well. The specimen was then passed off to pathology. Each tube was identified, grasped, clamped, cut and ligated with a free tie. Tubes were passed off for pathology.  After completion of the hysterectomy, all pedicles from the uterosacral ligament to the cornua were examined hemostasis was confirmed.  The peritoneum was then closed in purse string fashion with 2/0 Vicryl. The vaginal cuff was then closed with 2/0 Vicryl.  All instruments were then removed from the pelvis.  The patient tolerated the procedure well.  All instruments, needles, and sponge counts were correct x 2. The patient  was taken to the recovery room in stable condition.    Arlina Robes, MD, [place] Attending [place], [facility]

## 2022-09-30 NOTE — Anesthesia Preprocedure Evaluation (Addendum)
Anesthesia Evaluation  Patient identified by MRN, date of birth, ID band Patient awake    Reviewed: Allergy & Precautions, NPO status , Patient's Chart, lab work & pertinent test results, reviewed documented beta blocker date and time   Airway Mallampati: III  TM Distance: >3 FB Neck ROM: Full    Dental no notable dental hx. (+) Dental Advisory Given, Teeth Intact   Pulmonary shortness of breath and with exertion, sleep apnea and Continuous Positive Airway Pressure Ventilation , former smoker   Pulmonary exam normal breath sounds clear to auscultation       Cardiovascular hypertension, Pt. on medications Normal cardiovascular exam Rhythm:Regular Rate:Normal  EKG 09/25/22 NSR, Normal   Neuro/Psych  Headaches  Anxiety        GI/Hepatic Neg liver ROS,GERD  Medicated,,  Endo/Other  diabetes, Well Controlled, Type 2  Morbid obesityGLP-1 Agonist therapy last dose 1/20  Renal/GU negative Renal ROS  negative genitourinary   Musculoskeletal negative musculoskeletal ROS (+)    Abdominal  (+) + obese  Peds  Hematology negative hematology ROS (+)   Anesthesia Other Findings   Reproductive/Obstetrics DUB Menorrhagia                             Anesthesia Physical Anesthesia Plan  ASA: 3  Anesthesia Plan: General   Post-op Pain Management: Regional block* and Minimal or no pain anticipated   Induction: Intravenous  PONV Risk Score and Plan: 4 or greater and Treatment may vary due to age or medical condition and Ondansetron  Airway Management Planned: Oral ETT  Additional Equipment: None  Intra-op Plan:   Post-operative Plan: Extubation in OR  Informed Consent: I have reviewed the patients History and Physical, chart, labs and discussed the procedure including the risks, benefits and alternatives for the proposed anesthesia with the patient or authorized representative who has indicated  his/her understanding and acceptance.     Dental advisory given  Plan Discussed with: CRNA and Anesthesiologist  Anesthesia Plan Comments:         Anesthesia Quick Evaluation

## 2022-09-30 NOTE — Progress Notes (Signed)
Lunch tray ordered 

## 2022-09-30 NOTE — Transfer of Care (Signed)
Immediate Anesthesia Transfer of Care Note  Patient: Dorothy Haynes  Procedure(s) Performed: HYSTERECTOMY VAGINAL WITH SALPINGECTOMY (Bilateral: Vagina )  Patient Location: PACU  Anesthesia Type:General  Level of Consciousness: drowsy and patient cooperative  Airway & Oxygen Therapy: Patient Spontanous Breathing and Patient connected to face mask oxygen  Post-op Assessment: Report given to RN and Post -op Vital signs reviewed and stable  Post vital signs: Reviewed and stable  Last Vitals:  Vitals Value Taken Time  BP 101/59 09/30/22 1300  Temp    Pulse 93 09/30/22 1300  Resp 32 09/30/22 1300  SpO2 99 % 09/30/22 1300  Vitals shown include unvalidated device data.  Last Pain:  Vitals:   09/30/22 0910  PainSc: 0-No pain         Complications: No notable events documented.

## 2022-09-30 NOTE — Progress Notes (Signed)
Patient arrived to [place] room 7 alert and oriented x4. Pain level 4/10. Bed in lowest position, call light in reach, mother at bedside. Will continue to monitor pt.

## 2022-09-30 NOTE — Anesthesia Procedure Notes (Signed)
Procedure Name: Intubation Date/Time: 09/30/2022 11:46 AM  Performed by: Lind Guest, CRNAPre-anesthesia Checklist: Patient identified, Emergency Drugs available, Suction available, Patient being monitored and Timeout performed Patient Re-evaluated:Patient Re-evaluated prior to induction Oxygen Delivery Method: Circle system utilized Preoxygenation: Pre-oxygenation with 100% oxygen Induction Type: IV induction Ventilation: Mask ventilation without difficulty Laryngoscope Size: Mac and 4 Grade View: Grade III Tube type: Oral Tube size: 7.0 mm Number of attempts: 1 Placement Confirmation: ETT inserted through vocal cords under direct vision, positive ETCO2 and breath sounds checked- equal and bilateral Secured at: 21 cm Tube secured with: Tape Dental Injury: Teeth and Oropharynx as per pre-operative assessment

## 2022-09-30 NOTE — Interval H&P Note (Signed)
History and Physical Interval Note:  09/30/2022 10:47 AM  Dorothy Haynes  has presented today for surgery, with the diagnosis of Menorrhagia.  The various methods of treatment have been discussed with the patient and family. After consideration of risks, benefits and other options for treatment, the patient has consented to  Procedure(s): HYSTERECTOMY VAGINAL WITH SALPINGECTOMY (Bilateral) as a surgical intervention.  The patient's history has been reviewed, patient examined, no change in status, stable for surgery.  I have reviewed the patient's chart and labs.  Questions were answered to the patient's satisfaction.     Chancy Milroy

## 2022-09-30 NOTE — Progress Notes (Signed)
[facility] Note  10/14/2022     CHIEF COMPLAINT Patient presents for Retina Follow Up   HISTORY OF PRESENT ILLNESS: Dorothy Haynes is a 37 y.o. female who presents to the clinic today for:   HPI     Retina Follow Up   Patient presents with  Diabetic Retinopathy.  In both eyes.  This started years ago.  Severity is moderate.  Duration of 12 months.  I, the attending physician,  performed the HPI with the patient and updated documentation appropriately.        Comments   Patient feels that the vision is the same. She is not using any eye drops at this time. She does not check her sugars and her A1C is 8.6.      Last edited by Bernarda Caffey, MD on 10/14/2022  8:16 AM.    Pt states she is not having any issues with her eyes, pt states her dr put her on Monjourno, she states she has lost 25lbs since starting it, but her A1c has gone up, pt denies fol or floaters  Referring physician: Audley Hose, MD [place],  [place] [address]  HISTORICAL INFORMATION:   Selected notes from the MEDICAL RECORD NUMBER Referred by Dr. Orson Eva for DM exam LEE:  Ocular Hx- PMH-DM (A1C: 7.9, metformin), HTN, smoker    CURRENT MEDICATIONS: No current outpatient medications on file. (Ophthalmic Drugs)   No current facility-administered medications for this visit. (Ophthalmic Drugs)   Current Outpatient Medications (Other)  Medication Sig   calcium-vitamin D (OSCAL WITH D) 500-200 MG-UNIT tablet Take 1 tablet by mouth 2 (two) times daily.   cetirizine (ZYRTEC) 10 MG tablet Take 1 tablet by mouth once daily   ibuprofen (ADVIL) 800 MG tablet Take 1 tablet (800 mg total) by mouth 3 (three) times daily.   losartan (COZAAR) 25 MG tablet TAKE 1 TABLET BY MOUTH AT BEDTIME   MOUNJARO 15 MG/0.5ML Pen Inject 15 mg into the skin every Saturday.   oxyCODONE-acetaminophen (PERCOCET/ROXICET) 5-325 MG tablet Take 1 tablet by mouth every 4  (four) hours as needed for moderate pain.   rosuvastatin (CRESTOR) 10 MG tablet Take 10 mg by mouth at bedtime.   VENTOLIN HFA 108 (90 Base) MCG/ACT inhaler Inhale 1-2 puffs into the lungs every 4 (four) hours as needed for shortness of breath.   No current facility-administered medications for this visit. (Other)   REVIEW OF SYSTEMS: ROS   Positive for: Endocrine, Cardiovascular, Eyes Negative for: Constitutional, Gastrointestinal, Neurological, Skin, Genitourinary, Musculoskeletal, HENT, Respiratory, Psychiatric, Allergic/Imm, Heme/Lymph Last edited by Annie Paras, COT on 10/14/2022  7:51 AM.      ALLERGIES No Known Allergies  PAST MEDICAL HISTORY Past Medical History:  Diagnosis Date   Anxiety    Back pain    Diabetes ([place])    Diverticulitis 03/15/2018   Gallbladder problem    GERD (gastroesophageal reflux disease) 2009   Gestational diabetes 2008   Headache    History of gallstones 10/2015   s/p cholecystectomy 10/09/15   Hyperlipidemia    Hypertension    Joint pain    Menorrhagia with irregular cycle 10/23/2016   Morbid obesity ([place]) 10/09/2015   Physical deconditioning 06/22/2019   Plantar fasciitis, bilateral 06/02/2019   Shortness of breath dyspnea    Sleep apnea    TMJ (dislocation of temporomandibular joint), initial encounter 06/02/2019   Tobacco abuse 10/09/2015   Past Surgical History:  Procedure Laterality Date   CHOLECYSTECTOMY N/A 10/09/2015   Procedure: LAPAROSCOPIC CHOLECYSTECTOMY ;  Surgeon: Fanny Skates, MD;  Location: [facility];  Service: General;  Laterality: N/A;   VAGINAL HYSTERECTOMY Bilateral 09/30/2022   Procedure: HYSTERECTOMY VAGINAL WITH SALPINGECTOMY;  Surgeon: Chancy Milroy, MD;  Location: [facility];  Service: Gynecology;  Laterality: Bilateral;   WISDOM TOOTH EXTRACTION      FAMILY HISTORY Family History  Problem Relation Age of Onset   Drug abuse Mother    Heart disease Mother    Diabetes Mother    Hypertension Mother     Cataracts Mother    Cancer Father    Throat cancer Father    Kidney Stones Father    Alcoholism Father    Drug abuse Father    Diabetes Maternal Grandmother    Diabetes Maternal Grandfather    Blindness Maternal Aunt    Alzheimer's disease Other    Diabetes Other    Hypertension Other    Heart disease Other    Heart disease Other    Diabetes Other    Hypertension Other    Thyroid disease Other    Amblyopia Neg Hx    Glaucoma Neg Hx    Macular degeneration Neg Hx    Retinal detachment Neg Hx    Strabismus Neg Hx    Retinitis pigmentosa Neg Hx    SOCIAL HISTORY Social History   Tobacco Use   Smoking status: Former    Packs/day: 0.20    Years: 17.00    Total pack years: 3.40    Types: Cigarettes    Quit date: 03/01/2018    Years since quitting: 4.6   Smokeless tobacco: Never  Vaping Use   Vaping Use: Never used  Substance Use Topics   Alcohol use: Not Currently    Comment: rarely   Drug use: No    Comment: previously used MJ, has been off recreational drugs       OPHTHALMIC EXAM:  Base Eye Exam     Visual Acuity (Snellen - Linear)       Right Left   Dist [place] 20/20 20/20         Tonometry (Tonopen, 7:54 AM)       Right Left   Pressure 18 19         Pupils       Dark Light Shape React APD   Right 3 2 Round Brisk None   Left 3 2 Round Brisk None         Visual Fields       Left Right    Full Full         Extraocular Movement       Right Left    Full, Ortho Full, Ortho         Neuro/Psych     Oriented x3: Yes   Mood/Affect: Normal         Dilation     Both eyes: 1.0% Mydriacyl, 2.5% Phenylephrine @ 7:52 AM           Slit Lamp and Fundus Exam     External Exam       Right Left   External Normal Normal         Slit Lamp Exam       Right Left   Lids/Lashes Normal Normal   Conjunctiva/Sclera mild Melanosis mild Melanosis   Cornea 1+ inferior Punctate epithelial erosions 1+ inferior Punctate epithelial  erosions   Anterior Chamber  deep and clear deep and clear   Iris Round and dilated, No NVI Round and dilated, No NVI   Lens 1+ Nuclear sclerosis, 1-2+ Cortical cataract 1+ Nuclear sclerosis, 1-2+ Cortical cataract   Anterior Vitreous Mild Vitreous syneresis Normal         Fundus Exam       Right Left   Disc Pink and Sharp, no NVD Pink and Sharp   C/D Ratio 0.4 0.4   Macula Flat, Good foveal reflex, mild Retinal pigment epithelial mottling, No heme or edema Flat, Good foveal reflex, mild Retinal pigment epithelial mottling, No heme or edema   Vessels mild tortuosity mild tortuosity   Periphery Attached, pigmented peripheral cystoid degenration greatest inferiorly, No heme Attached, pigmented peripheral cystoid degenration greatest inferiorly, No heme            IMAGING AND PROCEDURES  Imaging and Procedures for @TODAY$ @  OCT, Retina - OU - Both Eyes       Right Eye Quality was good. Central Foveal Thickness: 246. Progression has been stable. Findings include normal foveal contour, no IRF, no SRF, vitreomacular adhesion .   Left Eye Quality was good. Central Foveal Thickness: 244. Progression has been stable. Findings include normal foveal contour, no IRF, no SRF, vitreomacular adhesion .   Notes *Images captured and stored on drive  Diagnosis / Impression:  NFP; no IRF/SRF OU, +VMA OU No DME OU  Clinical management:  See below  Abbreviations: NFP - Normal foveal profile. CME - cystoid macular edema. PED - pigment epithelial detachment. IRF - intraretinal fluid. SRF - subretinal fluid. EZ - ellipsoid zone. ERM - epiretinal membrane. ORA - outer retinal atrophy. ORT - outer retinal tubulation. SRHM - subretinal hyper-reflective material\            ASSESSMENT/PLAN:   ICD-10-CM   1. Diabetes mellitus type 2 without retinopathy ([place])  E11.9 OCT, Retina - OU - Both Eyes    2. Essential hypertension  I10     3. Hypertensive retinopathy of both eyes  H35.033      4. Combined forms of age-related cataract of both eyes  H25.813       1. Diabetes mellitus, type 2 without retinopathy  - A1c 7.7 on 01.25.24, 7.3 on 12.15.22  - BCVA remains 20/20 OU  - no DME or heme on exam or OCT  - The incidence, risk factors for progression, natural history and treatment options for diabetic retinopathy  were discussed with patient.    - The need for close monitoring of blood glucose, blood pressure, and serum lipids, avoiding cigarette or any type of tobacco, and the need for long term follow up was also discussed with patient.   - f/u in 1 year, sooner prn  2,3. Hypertensive retinopathy OU  - discussed importance of tight BP control  - monitor   4. Mild Mixed form cataract OU  - The symptoms of cataract, surgical options, and treatments and risks were discussed with patient.  - discussed diagnosis and progression  - very mild  - monitor  Ophthalmic Meds Ordered this visit:  No orders of the defined types were placed in this encounter.    Return in about 1 year (around 10/15/2023) for annual diabetic eye exam w/ OCT.  There are no Patient Instructions on file for this visit.  This document serves as a record of services personally performed by Gardiner Sleeper, MD, PhD. It was created on their behalf by Renaldo Reel, COT an ophthalmic  technician. The creation of this record is the provider's dictation and/or activities during the visit.    Electronically signed by:  Renaldo Reel, COT  01.30.24 8:48 AM  This document serves as a record of services personally performed by Gardiner Sleeper, MD, PhD. It was created on their behalf by San Jetty. Owens Shark, OA an ophthalmic technician. The creation of this record is the provider's dictation and/or activities during the visit.    Electronically signed by: San Jetty. Owens Shark, [place] 02.13.2024 8:48 AM  Gardiner Sleeper, M.D., Ph.D. Diseases & Surgery of the Retina and Vitreous [facility]  I have reviewed the above documentation for accuracy and completeness, and I agree with the above. Gardiner Sleeper, M.D., Ph.D. 10/14/22 8:50 AM  Abbreviations: M myopia (nearsighted); A astigmatism; H hyperopia (farsighted); P presbyopia; Mrx spectacle prescription;  CTL contact lenses; OD right eye; OS left eye; OU both eyes  XT exotropia; ET esotropia; PEK punctate epithelial keratitis; PEE punctate epithelial erosions; DES dry eye syndrome; MGD meibomian gland dysfunction; ATs artificial tears; PFAT's preservative free artificial tears; [place] nuclear sclerotic cataract; PSC posterior subcapsular cataract; ERM epi-retinal membrane; PVD posterior vitreous detachment; RD retinal detachment; DM diabetes mellitus; DR diabetic retinopathy; NPDR non-proliferative diabetic retinopathy; PDR proliferative diabetic retinopathy; CSME clinically significant macular edema; DME diabetic macular edema; dbh dot blot hemorrhages; CWS cotton wool spot; POAG primary open angle glaucoma; C/D cup-to-disc ratio; HVF humphrey visual field; GVF goldmann visual field; OCT optical coherence tomography; IOP intraocular pressure; BRVO Branch retinal vein occlusion; CRVO central retinal vein occlusion; CRAO central retinal artery occlusion; BRAO branch retinal artery occlusion; RT retinal tear; SB scleral buckle; PPV pars plana vitrectomy; VH Vitreous hemorrhage; PRP panretinal laser photocoagulation; IVK intravitreal kenalog; VMT vitreomacular traction; MH Macular hole;  NVD neovascularization of the disc; NVE neovascularization elsewhere; AREDS age related eye disease study; ARMD age related macular degeneration; POAG primary open angle glaucoma; EBMD epithelial/anterior basement membrane dystrophy; ACIOL anterior chamber intraocular lens; IOL intraocular lens; PCIOL posterior chamber intraocular lens; Phaco/IOL phacoemulsification with intraocular lens placement; [facility] photorefractive keratectomy; LASIK laser assisted in situ  keratomileusis; HTN hypertension; DM diabetes mellitus; COPD chronic obstructive pulmonary disease

## 2022-10-01 ENCOUNTER — Encounter (HOSPITAL_COMMUNITY): Payer: Self-pay | Admitting: Obstetrics and Gynecology

## 2022-10-01 DIAGNOSIS — N920 Excessive and frequent menstruation with regular cycle: Secondary | ICD-10-CM | POA: Diagnosis not present

## 2022-10-01 LAB — BASIC METABOLIC PANEL
Anion gap: 11 (ref 5–15)
BUN: 9 mg/dL (ref 6–20)
CO2: 20 mmol/L — ABNORMAL LOW (ref 22–32)
Calcium: 9.1 mg/dL (ref 8.9–10.3)
Chloride: 101 mmol/L (ref 98–111)
Creatinine, Ser: 0.73 mg/dL (ref 0.44–1.00)
GFR, Estimated: >60 mL/min (ref >60)
Glucose, Bld: 261 mg/dL — ABNORMAL HIGH (ref 70–99)
Potassium: 4 mmol/L (ref 3.5–5.1)
Sodium: 132 mmol/L — ABNORMAL LOW (ref 135–145)

## 2022-10-01 LAB — CBC
HCT: 32.5 % — ABNORMAL LOW (ref 36.0–46.0)
Hemoglobin: 10.8 g/dL — ABNORMAL LOW (ref 12.0–15.0)
MCH: 29.2 pg (ref 26.0–34.0)
MCHC: 33.2 g/dL (ref 30.0–36.0)
MCV: 87.8 fL (ref 80.0–100.0)
Platelets: 468 10*3/uL — ABNORMAL HIGH (ref 150–400)
RBC: 3.7 MIL/uL — ABNORMAL LOW (ref 3.87–5.11)
RDW: 13.2 % (ref 11.5–15.5)
WBC: 16.1 10*3/uL — ABNORMAL HIGH (ref 4.0–10.5)
nRBC: 0 % (ref 0.0–0.2)

## 2022-10-01 LAB — SURGICAL PATHOLOGY

## 2022-10-01 MED ORDER — IBUPROFEN 800 MG PO TABS: 800.0000 mg | ORAL_TABLET | Freq: Three times a day (TID) | ORAL | 0 refills | Status: AC

## 2022-10-01 MED ORDER — OXYCODONE-ACETAMINOPHEN 5-325 MG PO TABS: 1.0000 | ORAL_TABLET | ORAL | 0 refills | Status: DC | PRN

## 2022-10-02 ENCOUNTER — Telehealth: Payer: Self-pay

## 2022-10-02 NOTE — Telephone Encounter (Signed)
Transition Care Management Follow-up Telephone Call Date of discharge and from where: 10/01/2022, [facility]  How have you been since you were released from the hospital? She said she is feeling fine, only occasional cramping  Any questions or concerns? No  Items Reviewed: Did the pt receive and understand the discharge instructions provided? Yes  Medications obtained and verified? Yes - she said she has all of her medications and did not have any questions about the med regime,  Other? No  Any new allergies since your discharge? No  Dietary orders reviewed? Yes Do you have support at home? Yes   Home Care and Equipment/Supplies: Were home health services ordered? no If so, what is the name of the agency? N/a  Has the agency set up a time to come to the patient's home? not applicable Were any new equipment or medical supplies ordered?  No What is the name of the medical supply agency? N/a Were you able to get the supplies/equipment? not applicable Do you have any questions related to the use of the equipment or supplies? No  Functional Questionnaire: (I = Independent and D = Dependent) ADLs: independent   Follow up appointments reviewed:  PCP Hospital f/u appt confirmed? Yes  Scheduled to see Dr Redmond Pulling- 11/12/2022 Specialist Hospital f/u appt confirmed?  She is waiting for a call back from the surgeon with an appointment.    Are transportation arrangements needed? No  If their condition worsens, is the pt aware to call PCP or go to the Emergency Dept.? Yes Was the patient provided with contact information for the PCP's office or ED? Yes Was to pt encouraged to call back with questions or concerns? Yes

## 2022-10-07 DIAGNOSIS — G4733 Obstructive sleep apnea (adult) (pediatric): Secondary | ICD-10-CM | POA: Diagnosis not present

## 2022-10-07 DIAGNOSIS — D508 Other iron deficiency anemias: Secondary | ICD-10-CM | POA: Diagnosis not present

## 2022-10-07 DIAGNOSIS — Z6841 Body Mass Index (BMI) 40.0 and over, adult: Secondary | ICD-10-CM | POA: Diagnosis not present

## 2022-10-07 DIAGNOSIS — G894 Chronic pain syndrome: Secondary | ICD-10-CM | POA: Diagnosis not present

## 2022-10-07 DIAGNOSIS — E1169 Type 2 diabetes mellitus with other specified complication: Secondary | ICD-10-CM | POA: Diagnosis not present

## 2022-10-14 ENCOUNTER — Encounter (INDEPENDENT_AMBULATORY_CARE_PROVIDER_SITE_OTHER): Payer: Self-pay | Admitting: Ophthalmology

## 2022-10-14 ENCOUNTER — Ambulatory Visit (INDEPENDENT_AMBULATORY_CARE_PROVIDER_SITE_OTHER): Payer: Medicaid Other | Admitting: Ophthalmology

## 2022-10-14 DIAGNOSIS — I10 Essential (primary) hypertension: Secondary | ICD-10-CM

## 2022-10-14 DIAGNOSIS — H35033 Hypertensive retinopathy, bilateral: Secondary | ICD-10-CM

## 2022-10-14 DIAGNOSIS — H25813 Combined forms of age-related cataract, bilateral: Secondary | ICD-10-CM

## 2022-10-14 DIAGNOSIS — E119 Type 2 diabetes mellitus without complications: Secondary | ICD-10-CM

## 2022-10-24 ENCOUNTER — Other Ambulatory Visit: Payer: Self-pay

## 2022-10-24 ENCOUNTER — Encounter: Payer: Self-pay | Admitting: Obstetrics and Gynecology

## 2022-10-24 ENCOUNTER — Ambulatory Visit (INDEPENDENT_AMBULATORY_CARE_PROVIDER_SITE_OTHER): Payer: Medicaid Other | Admitting: Obstetrics and Gynecology

## 2022-10-24 ENCOUNTER — Encounter: Payer: Self-pay | Admitting: Family Medicine

## 2022-10-24 VITALS — BP 134/102 | HR 98 | Ht 63.0 in | Wt 254.9 lb

## 2022-10-24 DIAGNOSIS — Z9889 Other specified postprocedural states: Secondary | ICD-10-CM

## 2022-10-24 DIAGNOSIS — Z90710 Acquired absence of both cervix and uterus: Secondary | ICD-10-CM

## 2022-10-24 NOTE — Progress Notes (Signed)
Dorothy Haynes presents for post op appt. S/P TVH/BS on 09/30/22 without problems Pathology reviewed with pt Denies any bowel or bladder dysfunction  PE AF VSS Chaperone present Lungs clear Heart RRR Abd soft + BS GU NL EGBUS, cuff well healed, no masses or tenderness  A/P Post op         HTN  Return to ADL's as tolerates. Return to work note provided. To see PCP for HTN management. F/U in 1 yr or PRN

## 2022-11-07 ENCOUNTER — Other Ambulatory Visit: Payer: Self-pay | Admitting: *Deleted

## 2022-11-07 DIAGNOSIS — Z9109 Other allergy status, other than to drugs and biological substances: Secondary | ICD-10-CM

## 2022-11-07 MED ORDER — CETIRIZINE HCL 10 MG PO TABS: 10.0000 mg | ORAL_TABLET | Freq: Every day | ORAL | 0 refills | Status: DC

## 2022-11-12 ENCOUNTER — Encounter: Payer: BC Managed Care – PPO | Admitting: Family Medicine

## 2022-11-13 ENCOUNTER — Encounter: Payer: Self-pay | Admitting: Family Medicine

## 2022-11-13 ENCOUNTER — Ambulatory Visit (INDEPENDENT_AMBULATORY_CARE_PROVIDER_SITE_OTHER): Payer: Medicaid Other | Admitting: Family Medicine

## 2022-11-13 VITALS — BP 122/89 | HR 88 | Temp 98.1°F | Resp 16 | Ht 63.0 in | Wt 259.0 lb

## 2022-11-13 DIAGNOSIS — Z0000 Encounter for general adult medical examination without abnormal findings: Secondary | ICD-10-CM

## 2022-11-13 DIAGNOSIS — Z6841 Body Mass Index (BMI) 40.0 and over, adult: Secondary | ICD-10-CM

## 2022-11-13 DIAGNOSIS — Z13220 Encounter for screening for lipoid disorders: Secondary | ICD-10-CM

## 2022-11-13 DIAGNOSIS — Z130 Encounter for screening for diseases of the blood and blood-forming organs and certain disorders involving the immune mechanism: Secondary | ICD-10-CM | POA: Diagnosis not present

## 2022-11-13 DIAGNOSIS — E6609 Other obesity due to excess calories: Secondary | ICD-10-CM | POA: Diagnosis not present

## 2022-11-13 DIAGNOSIS — E1169 Type 2 diabetes mellitus with other specified complication: Secondary | ICD-10-CM | POA: Diagnosis not present

## 2022-11-14 ENCOUNTER — Other Ambulatory Visit: Payer: Medicaid Other

## 2022-11-14 DIAGNOSIS — Z0000 Encounter for general adult medical examination without abnormal findings: Secondary | ICD-10-CM | POA: Diagnosis not present

## 2022-11-14 DIAGNOSIS — Z130 Encounter for screening for diseases of the blood and blood-forming organs and certain disorders involving the immune mechanism: Secondary | ICD-10-CM | POA: Diagnosis not present

## 2022-11-14 DIAGNOSIS — Z13220 Encounter for screening for lipoid disorders: Secondary | ICD-10-CM | POA: Diagnosis not present

## 2022-11-15 LAB — LIPID PANEL
Chol/HDL Ratio: 2.5 ratio (ref 0.0–4.4)
Cholesterol, Total: 108 mg/dL (ref 100–199)
HDL: 44 mg/dL (ref >39)
LDL Chol Calc (NIH): 46 mg/dL (ref 0–99)
Triglycerides: 96 mg/dL (ref 0–149)
VLDL Cholesterol Cal: 18 mg/dL (ref 5–40)

## 2022-11-15 LAB — CBC WITH DIFFERENTIAL/PLATELET
Basophils Absolute: 0.1 10*3/uL (ref 0.0–0.2)
Basos: 1 %
EOS (ABSOLUTE): 0.2 10*3/uL (ref 0.0–0.4)
Eos: 2 %
Hematocrit: 36.1 % (ref 34.0–46.6)
Hemoglobin: 11.6 g/dL (ref 11.1–15.9)
Immature Grans (Abs): 0 10*3/uL (ref 0.0–0.1)
Immature Granulocytes: 0 %
Lymphocytes Absolute: 3.6 10*3/uL — ABNORMAL HIGH (ref 0.7–3.1)
Lymphs: 34 %
MCH: 29.2 pg (ref 26.6–33.0)
MCHC: 32.1 g/dL (ref 31.5–35.7)
MCV: 91 fL (ref 79–97)
Monocytes Absolute: 0.6 10*3/uL (ref 0.1–0.9)
Monocytes: 6 %
Neutrophils Absolute: 6.1 10*3/uL (ref 1.4–7.0)
Neutrophils: 57 %
Platelets: 454 10*3/uL — ABNORMAL HIGH (ref 150–450)
RBC: 3.97 x10E6/uL (ref 3.77–5.28)
RDW: 12.7 % (ref 11.7–15.4)
WBC: 10.7 10*3/uL (ref 3.4–10.8)

## 2022-11-15 LAB — CMP14+EGFR
ALT: 15 IU/L (ref 0–32)
AST: 15 IU/L (ref 0–40)
Albumin/Globulin Ratio: 1.4 (ref 1.2–2.2)
Albumin: 3.9 g/dL (ref 3.9–4.9)
Alkaline Phosphatase: 81 IU/L (ref 44–121)
BUN/Creatinine Ratio: 18 (ref 9–23)
BUN: 10 mg/dL (ref 6–20)
Bilirubin Total: <0.2 mg/dL (ref 0.0–1.2)
CO2: 21 mmol/L (ref 20–29)
Calcium: 9.3 mg/dL (ref 8.7–10.2)
Chloride: 100 mmol/L (ref 96–106)
Creatinine, Ser: 0.57 mg/dL (ref 0.57–1.00)
Globulin, Total: 2.8 g/dL (ref 1.5–4.5)
Glucose: 128 mg/dL — ABNORMAL HIGH (ref 70–99)
Potassium: 4.2 mmol/L (ref 3.5–5.2)
Sodium: 139 mmol/L (ref 134–144)
Total Protein: 6.7 g/dL (ref 6.0–8.5)
eGFR: 119 mL/min/{1.73_m2} (ref >59)

## 2022-11-16 LAB — MICROALBUMIN / CREATININE URINE RATIO
Creatinine, Urine: 173.9 mg/dL
Microalb/Creat Ratio: 7 mg/g creat (ref 0–29)
Microalbumin, Urine: 12.4 ug/mL

## 2022-11-17 ENCOUNTER — Ambulatory Visit (INDEPENDENT_AMBULATORY_CARE_PROVIDER_SITE_OTHER): Payer: Medicaid Other | Admitting: Orthopedic Surgery

## 2022-11-17 ENCOUNTER — Encounter: Payer: Self-pay | Admitting: Family Medicine

## 2022-11-17 ENCOUNTER — Other Ambulatory Visit (INDEPENDENT_AMBULATORY_CARE_PROVIDER_SITE_OTHER): Payer: Medicaid Other

## 2022-11-17 VITALS — BP 129/84 | HR 99 | Ht 63.0 in | Wt 259.0 lb

## 2022-11-17 DIAGNOSIS — Q7649 Other congenital malformations of spine, not associated with scoliosis: Secondary | ICD-10-CM | POA: Diagnosis not present

## 2022-11-17 DIAGNOSIS — I152 Hypertension secondary to endocrine disorders: Secondary | ICD-10-CM | POA: Diagnosis not present

## 2022-11-17 DIAGNOSIS — E1169 Type 2 diabetes mellitus with other specified complication: Secondary | ICD-10-CM | POA: Diagnosis not present

## 2022-11-17 DIAGNOSIS — M5416 Radiculopathy, lumbar region: Secondary | ICD-10-CM

## 2022-11-17 DIAGNOSIS — E7849 Other hyperlipidemia: Secondary | ICD-10-CM | POA: Diagnosis not present

## 2022-11-17 DIAGNOSIS — G4733 Obstructive sleep apnea (adult) (pediatric): Secondary | ICD-10-CM | POA: Diagnosis not present

## 2022-11-17 DIAGNOSIS — Z6841 Body Mass Index (BMI) 40.0 and over, adult: Secondary | ICD-10-CM | POA: Diagnosis not present

## 2022-11-17 DIAGNOSIS — G894 Chronic pain syndrome: Secondary | ICD-10-CM | POA: Diagnosis not present

## 2022-11-17 NOTE — Progress Notes (Signed)
Established Patient Office Visit  Subjective    Patient ID: Dorothy Haynes, female    DOB: 08-12-1986  Age: 37 y.o. MRN: [id number]  CC: No chief complaint on file.   HPI Dorothy Haynes presents for routine annual exam. Patient denies acute complaints or concerns.    Outpatient Encounter Medications as of 11/13/2022  Medication Sig   calcium-vitamin D (OSCAL WITH D) 500-200 MG-UNIT tablet Take 1 tablet by mouth 2 (two) times daily.   cetirizine (ZYRTEC) 10 MG tablet Take 1 tablet (10 mg total) by mouth daily.   ibuprofen (ADVIL) 800 MG tablet Take 1 tablet (800 mg total) by mouth 3 (three) times daily.   losartan (COZAAR) 25 MG tablet TAKE 1 TABLET BY MOUTH AT BEDTIME   MOUNJARO 15 MG/0.5ML Pen Inject 15 mg into the skin every Saturday.   oxyCODONE-acetaminophen (PERCOCET/ROXICET) 5-325 MG tablet Take 1 tablet by mouth every 4 (four) hours as needed for moderate pain.   rosuvastatin (CRESTOR) 10 MG tablet Take 10 mg by mouth at bedtime.   VENTOLIN HFA 108 (90 Base) MCG/ACT inhaler Inhale 1-2 puffs into the lungs every 4 (four) hours as needed for shortness of breath.   No facility-administered encounter medications on file as of 11/13/2022.    Past Medical History:  Diagnosis Date   Anxiety    Back pain    Diabetes ([place])    Diverticulitis 03/15/2018   Gallbladder problem    GERD (gastroesophageal reflux disease) 2009   Gestational diabetes 2008   Headache    History of gallstones 10/2015   s/p cholecystectomy 10/09/15   Hyperlipidemia    Hypertension    Joint pain    Menorrhagia with irregular cycle 10/23/2016   Morbid obesity ([place]) 10/09/2015   Physical deconditioning 06/22/2019   Plantar fasciitis, bilateral 06/02/2019   Shortness of breath dyspnea    Sleep apnea    TMJ (dislocation of temporomandibular joint), initial encounter 06/02/2019   Tobacco abuse 10/09/2015    Past Surgical History:  Procedure Laterality Date   CHOLECYSTECTOMY N/A 10/09/2015    Procedure: LAPAROSCOPIC CHOLECYSTECTOMY ;  Surgeon: Fanny Skates, MD;  Location: [facility];  Service: General;  Laterality: N/A;   VAGINAL HYSTERECTOMY Bilateral 09/30/2022   Procedure: HYSTERECTOMY VAGINAL WITH SALPINGECTOMY;  Surgeon: Chancy Milroy, MD;  Location: [facility];  Service: Gynecology;  Laterality: Bilateral;   WISDOM TOOTH EXTRACTION      Family History  Problem Relation Age of Onset   Drug abuse Mother    Heart disease Mother    Diabetes Mother    Hypertension Mother    Cataracts Mother    Cancer Father    Throat cancer Father    Kidney Stones Father    Alcoholism Father    Drug abuse Father    Diabetes Maternal Grandmother    Diabetes Maternal Grandfather    Blindness Maternal Aunt    Alzheimer's disease Other    Diabetes Other    Hypertension Other    Heart disease Other    Heart disease Other    Diabetes Other    Hypertension Other    Thyroid disease Other    Amblyopia Neg Hx    Glaucoma Neg Hx    Macular degeneration Neg Hx    Retinal detachment Neg Hx    Strabismus Neg Hx    Retinitis pigmentosa Neg Hx     Social History   Socioeconomic History   Marital status: Single    Spouse name: Not on  file   Number of children: 1   Years of education: college graduate   Highest education level: Not on file  Occupational History    Employer: [employer]    Comment: part-time   Occupation: Biomedical scientist  Tobacco Use   Smoking status: Former    Packs/day: 0.20    Years: 17.00    Additional pack years: 0.00    Total pack years: 3.40    Types: Cigarettes    Quit date: 03/01/2018    Years since quitting: 4.7   Smokeless tobacco: Never  Vaping Use   Vaping Use: Never used  Substance and Sexual Activity   Alcohol use: Not Currently    Comment: rarely   Drug use: No    Comment: previously used MJ, has been off recreational drugs   Sexual activity: Yes    Partners: Male    Birth control/protection: Injection  Other Topics Concern   Not  on file  Social History Narrative   Lives with mother and son   Social Determinants of Health   Financial Resource Strain: Not on file  Food Insecurity: Food Insecurity Present (04/29/2021)   Hunger Vital Sign    Worried About [place] in the Last Year: Sometimes true    [place] in the Last Year: Sometimes true  Transportation Needs: No Transportation Needs (04/29/2021)   PRAPARE - Hydrologist (Medical): No    Lack of Transportation (Non-Medical): No  Physical Activity: Not on file  Stress: Not on file  Social Connections: Not on file  Intimate Partner Violence: Not on file    Review of Systems  All other systems reviewed and are negative.       Objective    BP 122/89   Pulse 88   Temp 98.1 F (36.7 C) (Oral)   Resp 16   Ht 5\' 3"  (1.6 m)   Wt 259 lb (117.5 kg)   SpO2 95%   BMI 45.88 kg/m   Physical Exam Vitals and nursing note reviewed.  Constitutional:      General: She is not in acute distress.    Appearance: She is obese.  HENT:     Head: Normocephalic and atraumatic.     Right Ear: Tympanic membrane, ear canal and external ear normal.     Left Ear: Tympanic membrane, ear canal and external ear normal.     Nose: Nose normal.     Mouth/Throat:     Mouth: Mucous membranes are moist.     Pharynx: Oropharynx is clear.  Eyes:     Conjunctiva/sclera: Conjunctivae normal.     Pupils: Pupils are equal, round, and reactive to light.  Neck:     Thyroid: No thyromegaly.  Cardiovascular:     Rate and Rhythm: Normal rate and regular rhythm.     Heart sounds: Normal heart sounds. No murmur heard. Pulmonary:     Effort: Pulmonary effort is normal. No respiratory distress.     Breath sounds: Normal breath sounds.  Abdominal:     General: There is no distension.     Palpations: Abdomen is soft. There is no mass.     Tenderness: There is no abdominal tenderness.  Musculoskeletal:        General: Normal range of  motion.     Cervical back: Normal range of motion and neck supple.  Skin:    General: Skin is warm and dry.  Neurological:  General: No focal deficit present.     Mental Status: She is alert and oriented to person, place, and time.  Psychiatric:        Mood and Affect: Mood normal.        Behavior: Behavior normal.         Assessment & Plan:   1. Annual physical exam  - CMP14+EGFR  2. Type 2 diabetes mellitus with other specified complication, without long-term current use of insulin (HCC) Recent A1c was above goal. Continue and monitor.  - HM DIABETES FOOT EXAM - Microalbumin / creatinine urine ratio  3. Screening for deficiency anemia  - CBC with Differential  4. Screening for lipid disorders  - Lipid Panel     No follow-ups on file.   Becky Sax, MD

## 2022-11-17 NOTE — Progress Notes (Signed)
Orthopedic Spine Surgery Office Note  Assessment: Patient is a 37 y.o. female with low back pain that radiates into her right lower extremity.  Lower extremity pain has gotten better with time but back pain remains constant and significant.  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 Discussed that the conservative treatments include:  -activity modification  -physical therapy  -over the counter pain medications  -medrol dosepak  -lumbar steroid injections -Patient has tried physical therapy, NSAIDs, Tylenol, intramuscular steroid injection, lidocaine patch, topical gels  -Recommended trial of lumbar steroid injection.  Referral provided to Dr. Ernestina Patches.  Also recommended weight loss to help with her back pain -If surgery is ever considered as a treatment option, would need A1c of 7.5 or less.  She would also need to lose weight - would need to get down to a weight of 225 pounds -Patient should return to office in 8 weeks, x-rays at next visit: None   Patient expressed understanding of the plan and all questions were answered to the patient's satisfaction.   ___________________________________________________________________________   History:  Patient is a 37 y.o. female who presents today for lumbar spine.  Patient has had 4 to 5 months of low back pain that radiates into her right lower extremity.  There is no trauma or injury that brought on the pain.  Pain is felt in the back the majority of the time.  She still gets occasional pain rating down the right leg.  She feels that on both the anterior and posterior aspect of the leg going all the way into the foot on both the plantar and dorsal aspect of the foot.  The leg pain is not as severe or as consistent as the back pain.  The leg pain initially was constant but has gotten  better with time.  She states that the back pain today is a 10 out of 10 but some days it is a 4 out of 10.     Weakness: Denies Symptoms of imbalance: Denies Paresthesias and numbness: Denies Bowel or bladder incontinence: Denies Saddle anesthesia: Denies  Treatments tried: physical therapy, NSAIDs, Tylenol, intramuscular steroid injection, lidocaine patch, topical gels  Review of systems: Denies fevers and chills, night sweats, unexplained weight loss, history of cancer.  Has had pain that wakes her at night  Past medical history: Anxiety Migraines Sleep apnea Diabetes (last A1c 7.7 on 09/25/2022) Hyperlipidemia Hypertension  Allergies: NKDA  Past surgical history:  Cholecystectomy Wisdom tooth extraction Hysterectomy  Social history: Denies use of nicotine product (smoking, vaping, patches, smokeless) Alcohol use: Denies Denies recreational drug use   Physical Exam:  General: no acute distress, appears stated age Neurologic: alert, answering questions appropriately, following commands Respiratory: unlabored breathing on room air, symmetric chest rise Psychiatric: appropriate affect, normal cadence to speech   MSK (spine):  -Strength exam      Left  Right EHL    5/5  5/5 TA    5/5  5/5 GSC    5/5  5/5 Knee extension  5/5  5/5 Hip flexion   5/5  5/5  -Sensory exam    Sensation intact to light touch in L3-S1 nerve distributions of bilateral lower extremities  -Achilles DTR: 2/4 on the left, 2/4 on the right -Patellar tendon DTR: 2/4 on the left, 2/4 on the right  -Straight leg raise: Negative bilaterally -Femoral nerve  stretch test: Negative bilaterally -Clonus: no beats bilaterally  -Left hip exam: No pain through range of motion, negative Stinchfield, negative FABER -Right hip exam: No pain through range of motion, negative Stinchfield, negative FABER  Imaging: XR of the lumbar spine from 06/11/2022 and 11/17/2022 was independently reviewed and  interpreted, showing disc height loss at L5/S1.  No other significant degenerative changes.  No evidence of instability on flexion/extension views.  No fracture or dislocation seen.  MRI of the lumbar spine from 08/22/2022 was independently reviewed and interpreted, showing DDD with Modic changes at L5/S1.  There is a right-sided paracentral disc herniation with caudal migration.   Patient name: Dorothy Haynes Patient MRN: [id number] Date of visit: 11/17/22

## 2022-12-08 ENCOUNTER — Other Ambulatory Visit: Payer: Self-pay | Admitting: *Deleted

## 2022-12-08 DIAGNOSIS — E119 Type 2 diabetes mellitus without complications: Secondary | ICD-10-CM

## 2022-12-08 DIAGNOSIS — I10 Essential (primary) hypertension: Secondary | ICD-10-CM

## 2022-12-08 MED ORDER — LOSARTAN POTASSIUM 25 MG PO TABS: 25.0000 mg | ORAL_TABLET | Freq: Every day | ORAL | 0 refills | Status: DC

## 2022-12-16 ENCOUNTER — Other Ambulatory Visit: Payer: Self-pay

## 2022-12-16 ENCOUNTER — Other Ambulatory Visit (HOSPITAL_COMMUNITY): Payer: Self-pay

## 2022-12-16 DIAGNOSIS — K5903 Drug induced constipation: Secondary | ICD-10-CM | POA: Diagnosis not present

## 2022-12-16 DIAGNOSIS — E1169 Type 2 diabetes mellitus with other specified complication: Secondary | ICD-10-CM | POA: Diagnosis not present

## 2022-12-16 DIAGNOSIS — M549 Dorsalgia, unspecified: Secondary | ICD-10-CM | POA: Diagnosis not present

## 2022-12-16 DIAGNOSIS — G8929 Other chronic pain: Secondary | ICD-10-CM | POA: Diagnosis not present

## 2022-12-16 DIAGNOSIS — Z6841 Body Mass Index (BMI) 40.0 and over, adult: Secondary | ICD-10-CM | POA: Diagnosis not present

## 2022-12-16 MED ORDER — MOUNJARO 12.5 MG/0.5ML ~~LOC~~ SOAJ
12.5000 mg | SUBCUTANEOUS | 3 refills | Status: DC
  Filled 2022-12-16: qty 2, 28d supply, fill #0
  Filled 2023-01-06: qty 2, 28d supply, fill #1
  Filled 2023-02-24: qty 2, 28d supply, fill #2

## 2022-12-17 ENCOUNTER — Other Ambulatory Visit: Payer: Self-pay

## 2022-12-18 ENCOUNTER — Encounter: Payer: Medicaid Other | Admitting: Physical Medicine and Rehabilitation

## 2022-12-22 ENCOUNTER — Ambulatory Visit (INDEPENDENT_AMBULATORY_CARE_PROVIDER_SITE_OTHER): Payer: Medicaid Other | Admitting: Physical Medicine and Rehabilitation

## 2022-12-22 ENCOUNTER — Encounter: Payer: Self-pay | Admitting: Physical Medicine and Rehabilitation

## 2022-12-22 ENCOUNTER — Other Ambulatory Visit: Payer: Self-pay

## 2022-12-22 VITALS — BP 133/89 | HR 82

## 2022-12-22 DIAGNOSIS — M5416 Radiculopathy, lumbar region: Secondary | ICD-10-CM | POA: Diagnosis not present

## 2022-12-22 MED ORDER — METHYLPREDNISOLONE ACETATE 80 MG/ML IJ SUSP
80.0000 mg | Freq: Once | INTRAMUSCULAR | Status: AC
Start: 2022-12-22 — End: 2022-12-22
  Administered 2022-12-22: 80 mg

## 2022-12-22 NOTE — Progress Notes (Signed)
Functional Pain Scale - descriptive words and definitions  Unmanageable (7)  Pain interferes with normal ADL's/nothing seems to help/sleep is very difficult/active distractions are very difficult to concentrate on. Severe range order  Average Pain 10  Patient states low back pain that has radiated down her right leg.  Has had numbness/tingling in the right leg.  Hurts to walk, sit, and stand.  Worse when standing for to long.   Not taking anything for the pain.    +Driver, -BT, -Dye Allergies.

## 2022-12-22 NOTE — Patient Instructions (Signed)

## 2022-12-28 NOTE — Procedures (Signed)
Lumbar Epidural Steroid Injection - Interlaminar Approach with Fluoroscopic Guidance  Patient: Dorothy Haynes      Date of Birth: 1985/10/23 MRN: [id number] PCP: Georganna Skeans, MD      Visit Date: 12/22/2022   Universal Protocol:     Consent Given By: the patient  Position: PRONE  Additional Comments: Vital signs were monitored before and after the procedure. Patient was prepped and draped in the usual sterile fashion. The correct patient, procedure, and site was verified.   Injection Procedure Details:   Procedure diagnoses: Lumbar radiculopathy [M54.16]   Meds Administered:  Meds ordered this encounter  Medications   methylPREDNISolone acetate (DEPO-MEDROL) injection 80 mg     Laterality: Right  Location/Site:  L5-S1  Needle: 4.5 in., 20 ga. Tuohy  Needle Placement: Paramedian epidural  Findings:   -Comments: Excellent flow of contrast into the epidural space.  Procedure Details: Using a paramedian approach from the side mentioned above, the region overlying the inferior lamina was localized under fluoroscopic visualization and the soft tissues overlying this structure were infiltrated with 4 ml. of 1% Lidocaine without Epinephrine. The Tuohy needle was inserted into the epidural space using a paramedian approach.   The epidural space was localized using loss of resistance along with counter oblique bi-planar fluoroscopic views.  After negative aspirate for air, blood, and CSF, a 2 ml. volume of Isovue-250 was injected into the epidural space and the flow of contrast was observed. Radiographs were obtained for documentation purposes.    The injectate was administered into the level noted above.   Additional Comments:  No complications occurred Dressing: 2 x 2 sterile gauze and Band-Aid    Post-procedure details: Patient was observed during the procedure. Post-procedure instructions were reviewed.  Patient left the clinic in stable condition.

## 2022-12-28 NOTE — Progress Notes (Signed)
Dorothy Haynes - 37 y.o. female MRN [id number]  Date of birth: [birth year]  Office Visit Note: Visit Date: 12/22/2022 PCP: Georganna Skeans, MD Referred by: London Sheer, MD  Subjective: Chief Complaint  Patient presents with   Lower Back - Pain   HPI:  Dorothy Haynes is a 37 y.o. female who comes in today at the request of Dr. Willia Craze for planned Right L5-S1 Lumbar Interlaminar epidural steroid injection with fluoroscopic guidance.  The patient has failed conservative care including home exercise, medications, time and activity modification.  This injection will be diagnostic and hopefully therapeutic.  Please see requesting physician notes for further details and justification.   ROS Otherwise per HPI.  Assessment & Plan: Visit Diagnoses:    ICD-10-CM   1. Lumbar radiculopathy  M54.16 XR C-ARM NO REPORT    Epidural Steroid injection    methylPREDNISolone acetate (DEPO-MEDROL) injection 80 mg      Plan: No additional findings.   Meds & Orders:  Meds ordered this encounter  Medications   methylPREDNISolone acetate (DEPO-MEDROL) injection 80 mg    Orders Placed This Encounter  Procedures   XR C-ARM NO REPORT   Epidural Steroid injection    Follow-up: Return for visit to requesting provider as needed.   Procedures: No procedures performed  Lumbar Epidural Steroid Injection - Interlaminar Approach with Fluoroscopic Guidance  Patient: Dorothy Haynes      Date of Birth: Dec 16, 1985 MRN: [id number] PCP: Georganna Skeans, MD      Visit Date: 12/22/2022   Universal Protocol:     Consent Given By: the patient  Position: PRONE  Additional Comments: Vital signs were monitored before and after the procedure. Patient was prepped and draped in the usual sterile fashion. The correct patient, procedure, and site was verified.   Injection Procedure Details:   Procedure diagnoses: Lumbar radiculopathy [M54.16]   Meds Administered:  Meds ordered this encounter   Medications   methylPREDNISolone acetate (DEPO-MEDROL) injection 80 mg     Laterality: Right  Location/Site:  L5-S1  Needle: 4.5 in., 20 ga. Tuohy  Needle Placement: Paramedian epidural  Findings:   -Comments: Excellent flow of contrast into the epidural space.  Procedure Details: Using a paramedian approach from the side mentioned above, the region overlying the inferior lamina was localized under fluoroscopic visualization and the soft tissues overlying this structure were infiltrated with 4 ml. of 1% Lidocaine without Epinephrine. The Tuohy needle was inserted into the epidural space using a paramedian approach.   The epidural space was localized using loss of resistance along with counter oblique bi-planar fluoroscopic views.  After negative aspirate for air, blood, and CSF, a 2 ml. volume of Isovue-250 was injected into the epidural space and the flow of contrast was observed. Radiographs were obtained for documentation purposes.    The injectate was administered into the level noted above.   Additional Comments:  No complications occurred Dressing: 2 x 2 sterile gauze and Band-Aid    Post-procedure details: Patient was observed during the procedure. Post-procedure instructions were reviewed.  Patient left the clinic in stable condition.   Clinical History: MRI LUMBAR SPINE WITHOUT CONTRAST   TECHNIQUE: Multiplanar, multisequence MR imaging of the lumbar spine was performed. No intravenous contrast was administered.   COMPARISON:  Radiograph from 06/11/2022 as well as previous MRI from 02/15/2015.   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 Same numbering system is employed as on previous exam.   Alignment: 3  mm retrolisthesis of L4 on L5 with mild exaggeration of the normal lumbar lordosis, stable.   Vertebrae: Vertebral body height maintained without acute or chronic fracture. No pars defect. Bone marrow  signal intensity mildly heterogeneous but overall within normal limits. No discrete or worrisome osseous lesions. Mild discogenic reactive endplate changes present about the L4-5 interspace. No other abnormal marrow edema.   Conus medullaris and cauda equina: Conus extends to the T12 level. Again seen is a small lipoma measuring 6 mm at the tip of the conus (series 7, image 7), stable. Conus and nerve roots of the cauda equina otherwise unremarkable.   Paraspinal and other soft tissues: Unremarkable.   Disc levels:   L1-2:  Unremarkable.   L2-3:  Unremarkable.   L3-4:  Unremarkable.   L4-5: 3 mm retrolisthesis. Disc desiccation with diffuse disc bulge. Associated mild reactive endplate spurring. Superimposed moderate-sized right subarticular disc extrusion with inferior migration, impinging upon the descending right L5 nerve root (series 14, image 32). Resultant severe right lateral recess stenosis. Central canal remains widely patent. Mild left L4 foraminal narrowing. No significant right foraminal stenosis.   L5-S1: Transitional lumbosacral anatomy with a rudimentary L5-S1 interspace. No disc bulge or focal disc herniation. No stenosis or impingement.   IMPRESSION: 1. Moderate-sized right subarticular disc extrusion with inferior migration at L4-5, impinging upon the descending right L5 nerve root in the right lateral recess. 2. 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 3. 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 Careful correlation with numbering system on this exam recommended prior to any potential future intervention.     Electronically Signed   By: Rise Mu M.D.   On: 08/24/2022 18:26     Objective:  VS:  HT:    WT:   BMI:     BP:133/89  HR:82bpm  TEMP: ( )  RESP:  Physical Exam Vitals and nursing note reviewed.  Constitutional:      General: She is not in acute distress.    Appearance: Normal appearance.  She is obese. She is not ill-appearing.  HENT:     Head: Normocephalic and atraumatic.     Right Ear: External ear normal.     Left Ear: External ear normal.  Eyes:     Extraocular Movements: Extraocular movements intact.  Cardiovascular:     Rate and Rhythm: Normal rate.     Pulses: Normal pulses.  Pulmonary:     Effort: Pulmonary effort is normal. No respiratory distress.  Abdominal:     General: There is no distension.     Palpations: Abdomen is soft.  Musculoskeletal:        General: Tenderness present.     Cervical back: Neck supple.     Right lower leg: No edema.     Left lower leg: No edema.     Comments: Patient has good distal strength with no pain over the greater trochanters.  No clonus or focal weakness.  Skin:    Findings: No erythema, lesion or rash.  Neurological:     General: No focal deficit present.     Mental Status: She is alert and oriented to person, place, and time.     Sensory: No sensory deficit.     Motor: No weakness or abnormal muscle tone.     Coordination: Coordination normal.  Psychiatric:        Mood and Affect: Mood normal.        Behavior: Behavior normal.  Imaging: No results found.

## 2023-01-06 ENCOUNTER — Other Ambulatory Visit (HOSPITAL_COMMUNITY): Payer: Self-pay

## 2023-01-15 ENCOUNTER — Ambulatory Visit (INDEPENDENT_AMBULATORY_CARE_PROVIDER_SITE_OTHER): Payer: Medicaid Other | Admitting: Orthopedic Surgery

## 2023-01-15 ENCOUNTER — Other Ambulatory Visit (HOSPITAL_COMMUNITY): Payer: Self-pay

## 2023-01-15 DIAGNOSIS — M5116 Intervertebral disc disorders with radiculopathy, lumbar region: Secondary | ICD-10-CM | POA: Diagnosis not present

## 2023-01-15 MED ORDER — PREGABALIN 75 MG PO CAPS
75.0000 mg | ORAL_CAPSULE | Freq: Two times a day (BID) | ORAL | 1 refills | Status: DC
  Filled 2023-01-15: qty 60, 30d supply, fill #0

## 2023-01-15 NOTE — Progress Notes (Signed)
Orthopedic Spine Surgery Office Note   Assessment: Patient is a 37 y.o. female with low back pain that radiates into her right lower extremity.  Got 100% relief with an injection but it did not last.  MRI shows right-sided paracentral L5/S1 disc herniation     Plan: -Patient has tried PT, NSAIDs, Tylenol, intramuscular steroid injection, lidocaine patch, topical gels, lumbar injection, gabapentin -Explained that she has tried most conservative treatments as noted above.  She is not wanting surgery at any time.  I told her that she has not tried Lyrica and that an additional nonoperative treatment she could try. This was prescribed to her today.  I discussed the fact that after that her options would be pain management or surgery since there are not any other conservative treatments to try -If surgery is ever considered as a treatment option, would need A1c of 7.5 or less.  She would also need to lose weight - would need to get down to a weight of 225 pounds -Patient should return to office in 8 weeks, x-rays at next visit: None     Patient expressed understanding of the plan and all questions were answered to the patient's satisfaction.    ___________________________________________________________________________     History:   Patient is a 37 y.o. female who presents today for follow up on her L5/S1 disc herniation.  Patient has had 5 months of low back pain that radiates into her right lower extremity.  After her last visit, patient got a right sided L5/S1 transforaminal injection with Dr. Alvester Morin.  She said she got 100% relief but only lasted for about a week and a half.  Pain then returned.  She feels it in her lower back radiating down the right lower extremity.  She feels it on the anterior and posterior aspect of the leg going all the way to the foot.  She does not have any symptoms on the left side.  She feels the pain on a daily basis.  Denies paresthesias and numbness.  No bowel or  bladder incontinence.  No saddle anesthesia.      Treatments tried: PT, NSAIDs, Tylenol, intramuscular steroid injection, lidocaine patch, topical gels, lumbar injection, gabapentin     Physical Exam:   General: no acute distress, appears stated age Neurologic: alert, answering questions appropriately, following commands Respiratory: unlabored breathing on room air, symmetric chest rise Psychiatric: appropriate affect, normal cadence to speech     MSK (spine):   -Strength exam                                                   Left                  Right EHL                              5/5                  5/5 TA                                 5/5                  5/5 GSC  5/5                  5/5 Knee extension            5/5                  5/5 Hip flexion                    5/5                  5/5   -Sensory exam                           Sensation intact to light touch in L3-S1 nerve distributions of bilateral lower extremities   -Achilles DTR: 2/4 on the left, 2/4 on the right -Patellar tendon DTR: 2/4 on the left, 2/4 on the right   -Straight leg raise: Negative bilaterally -Femoral nerve stretch test: Negative bilaterally -Clonus: no beats bilaterally    Imaging: XR of the lumbar spine from 06/11/2022 and 11/17/2022 was previously independently reviewed and interpreted, showing disc height loss at L5/S1.  No other significant degenerative changes.  No evidence of instability on flexion/extension views.  No fracture or dislocation seen.   MRI of the lumbar spine from 08/22/2022 was previously independently reviewed and interpreted, showing DDD with Modic changes at L5/S1.  There is a right-sided paracentral disc herniation with caudal migration.     Patient name: Dorothy Haynes Patient MRN: [id number] Date of visit: 01/15/23

## 2023-01-22 DIAGNOSIS — Z6841 Body Mass Index (BMI) 40.0 and over, adult: Secondary | ICD-10-CM | POA: Diagnosis not present

## 2023-01-22 DIAGNOSIS — E1169 Type 2 diabetes mellitus with other specified complication: Secondary | ICD-10-CM | POA: Diagnosis not present

## 2023-01-22 DIAGNOSIS — I152 Hypertension secondary to endocrine disorders: Secondary | ICD-10-CM | POA: Diagnosis not present

## 2023-01-22 DIAGNOSIS — M546 Pain in thoracic spine: Secondary | ICD-10-CM | POA: Diagnosis not present

## 2023-01-22 DIAGNOSIS — Z9189 Other specified personal risk factors, not elsewhere classified: Secondary | ICD-10-CM | POA: Diagnosis not present

## 2023-02-11 DIAGNOSIS — Z6841 Body Mass Index (BMI) 40.0 and over, adult: Secondary | ICD-10-CM | POA: Diagnosis not present

## 2023-02-11 DIAGNOSIS — G4733 Obstructive sleep apnea (adult) (pediatric): Secondary | ICD-10-CM | POA: Diagnosis not present

## 2023-02-11 DIAGNOSIS — E1169 Type 2 diabetes mellitus with other specified complication: Secondary | ICD-10-CM | POA: Diagnosis not present

## 2023-02-11 DIAGNOSIS — R232 Flushing: Secondary | ICD-10-CM | POA: Diagnosis not present

## 2023-02-19 ENCOUNTER — Other Ambulatory Visit: Payer: Self-pay | Admitting: *Deleted

## 2023-02-19 MED ORDER — ROSUVASTATIN CALCIUM 10 MG PO TABS: 10.0000 mg | ORAL_TABLET | Freq: Every day | ORAL | 0 refills | Status: DC

## 2023-02-24 ENCOUNTER — Other Ambulatory Visit (HOSPITAL_COMMUNITY): Payer: Self-pay

## 2023-02-26 ENCOUNTER — Other Ambulatory Visit (HOSPITAL_COMMUNITY): Payer: Self-pay

## 2023-02-27 ENCOUNTER — Other Ambulatory Visit: Payer: Self-pay | Admitting: Family Medicine

## 2023-02-27 DIAGNOSIS — Z9109 Other allergy status, other than to drugs and biological substances: Secondary | ICD-10-CM

## 2023-03-03 ENCOUNTER — Other Ambulatory Visit: Payer: Self-pay | Admitting: Family Medicine

## 2023-03-03 DIAGNOSIS — E119 Type 2 diabetes mellitus without complications: Secondary | ICD-10-CM

## 2023-03-03 DIAGNOSIS — I10 Essential (primary) hypertension: Secondary | ICD-10-CM

## 2023-03-10 ENCOUNTER — Encounter: Payer: Self-pay | Admitting: Family Medicine

## 2023-03-10 ENCOUNTER — Ambulatory Visit (INDEPENDENT_AMBULATORY_CARE_PROVIDER_SITE_OTHER): Payer: Medicaid Other | Admitting: Family Medicine

## 2023-03-10 VITALS — BP 122/82 | HR 98 | Temp 98.1°F | Resp 16 | Wt 254.0 lb

## 2023-03-10 DIAGNOSIS — E1169 Type 2 diabetes mellitus with other specified complication: Secondary | ICD-10-CM

## 2023-03-10 DIAGNOSIS — Z7985 Long-term (current) use of injectable non-insulin antidiabetic drugs: Secondary | ICD-10-CM | POA: Diagnosis not present

## 2023-03-10 DIAGNOSIS — E785 Hyperlipidemia, unspecified: Secondary | ICD-10-CM

## 2023-03-10 DIAGNOSIS — I152 Hypertension secondary to endocrine disorders: Secondary | ICD-10-CM

## 2023-03-10 DIAGNOSIS — E1159 Type 2 diabetes mellitus with other circulatory complications: Secondary | ICD-10-CM | POA: Diagnosis not present

## 2023-03-10 DIAGNOSIS — Z0000 Encounter for general adult medical examination without abnormal findings: Secondary | ICD-10-CM

## 2023-03-10 LAB — POCT GLYCOSYLATED HEMOGLOBIN (HGB A1C): Hemoglobin A1C: 6.7 % — AB (ref 4.0–5.6)

## 2023-03-10 NOTE — Progress Notes (Unsigned)
Patient is here for their 3 month follow-up Patient has no concerns today Care gaps have been discussed with patient  

## 2023-03-12 ENCOUNTER — Encounter: Payer: Self-pay | Admitting: Family Medicine

## 2023-03-12 ENCOUNTER — Ambulatory Visit: Payer: Medicaid Other | Admitting: Orthopedic Surgery

## 2023-03-12 NOTE — Progress Notes (Signed)
Established Patient Office Visit  Subjective    Patient ID: Dorothy Haynes, female    DOB: 1986-02-25  Age: 37 y.o. MRN: [id number]  CC:  Chief Complaint  Patient presents with   Follow-up   Diabetes    HPI Dorothy Haynes presents to for follow up of chronic med issues. Patient denies acute complaints.   Outpatient Encounter Medications as of 03/10/2023  Medication Sig   calcium-vitamin D (OSCAL WITH D) 500-200 MG-UNIT tablet Take 1 tablet by mouth 2 (two) times daily.   cetirizine (ZYRTEC) 10 MG tablet Take 1 tablet by mouth once daily   ibuprofen (ADVIL) 800 MG tablet Take 1 tablet (800 mg total) by mouth 3 (three) times daily.   losartan (COZAAR) 25 MG tablet TAKE 1 TABLET BY MOUTH AT BEDTIME   MOUNJARO 15 MG/0.5ML Pen Inject 15 mg into the skin every Saturday.   oxyCODONE-acetaminophen (PERCOCET/ROXICET) 5-325 MG tablet Take 1 tablet by mouth every 4 (four) hours as needed for moderate pain.   pregabalin (LYRICA) 75 MG capsule Take 1 capsule (75 mg total) by mouth 2 (two) times daily.   rosuvastatin (CRESTOR) 10 MG tablet Take 1 tablet (10 mg total) by mouth at bedtime.   tirzepatide (MOUNJARO) 12.5 MG/0.5ML Pen Inject 12.5 mg into the skin once a week.   VENTOLIN HFA 108 (90 Base) MCG/ACT inhaler Inhale 1-2 puffs into the lungs every 4 (four) hours as needed for shortness of breath.   No facility-administered encounter medications on file as of 03/10/2023.    Past Medical History:  Diagnosis Date   Anxiety    Back pain    Diabetes (HCC)    Diverticulitis 03/15/2018   Gallbladder problem    GERD (gastroesophageal reflux disease) 2009   Gestational diabetes 2008   Headache    History of gallstones 10/2015   s/p cholecystectomy 10/09/15   Hyperlipidemia    Hypertension    Joint pain    Menorrhagia with irregular cycle 10/23/2016   Morbid obesity (HCC) 10/09/2015   Physical deconditioning 06/22/2019   Plantar fasciitis, bilateral 06/02/2019   Shortness of breath  dyspnea    Sleep apnea    TMJ (dislocation of temporomandibular joint), initial encounter 06/02/2019   Tobacco abuse 10/09/2015    Past Surgical History:  Procedure Laterality Date   CHOLECYSTECTOMY N/A 10/09/2015   Procedure: LAPAROSCOPIC CHOLECYSTECTOMY ;  Surgeon: Claud Kelp, MD;  Location: [facility];  Service: General;  Laterality: N/A;   VAGINAL HYSTERECTOMY Bilateral 09/30/2022   Procedure: HYSTERECTOMY VAGINAL WITH SALPINGECTOMY;  Surgeon: Hermina Staggers, MD;  Location: [facility];  Service: Gynecology;  Laterality: Bilateral;   WISDOM TOOTH EXTRACTION      Family History  Problem Relation Age of Onset   Drug abuse Mother    Heart disease Mother    Diabetes Mother    Hypertension Mother    Cataracts Mother    Cancer Father    Throat cancer Father    Kidney Stones Father    Alcoholism Father    Drug abuse Father    Diabetes Maternal Grandmother    Diabetes Maternal Grandfather    Blindness Maternal Aunt    Alzheimer's disease Other    Diabetes Other    Hypertension Other    Heart disease Other    Heart disease Other    Diabetes Other    Hypertension Other    Thyroid disease Other    Amblyopia Neg Hx    Glaucoma Neg Hx  Macular degeneration Neg Hx    Retinal detachment Neg Hx    Strabismus Neg Hx    Retinitis pigmentosa Neg Hx     Social History   Socioeconomic History   Marital status: Single    Spouse name: Not on file   Number of children: 1   Years of education: college graduate   Highest education level: Not on file  Occupational History    Employer: [employer]    Comment: part-time   Occupation: Press photographer  Tobacco Use   Smoking status: Former    Current packs/day: 0.00    Average packs/day: 0.2 packs/day for 17.0 years (3.4 ttl pk-yrs)    Types: Cigarettes    Start date: 03/01/2001    Quit date: 03/01/2018    Years since quitting: 5.0   Smokeless tobacco: Never  Vaping Use   Vaping status: Never Used  Substance and Sexual  Activity   Alcohol use: Not Currently    Comment: rarely   Drug use: No    Comment: previously used MJ, has been off recreational drugs   Sexual activity: Yes    Partners: Male    Birth control/protection: Injection  Other Topics Concern   Not on file  Social History Narrative   Lives with mother and son   Social Determinants of Health   Financial Resource Strain: Not on file  Food Insecurity: Food Insecurity Present (04/29/2021)   Hunger Vital Sign    Worried About Running Out of Food in the Last Year: Sometimes true    Ran Out of Food in the Last Year: Sometimes true  Transportation Needs: No Transportation Needs (04/29/2021)   PRAPARE - Administrator, Civil Service (Medical): No    Lack of Transportation (Non-Medical): No  Physical Activity: Not on file  Stress: Not on file  Social Connections: Not on file  Intimate Partner Violence: Not on file    Review of Systems  All other systems reviewed and are negative.       Objective    BP 122/82   Pulse 98   Temp 98.1 F (36.7 C) (Oral)   Resp 16   Wt 254 lb (115.2 kg) Comment: at weight loss clinic  SpO2 97%   BMI 44.99 kg/m   Physical Exam Vitals and nursing note reviewed.  Constitutional:      General: She is not in acute distress. Cardiovascular:     Rate and Rhythm: Normal rate and regular rhythm.  Pulmonary:     Effort: Pulmonary effort is normal.     Breath sounds: Normal breath sounds.  Abdominal:     Palpations: Abdomen is soft.     Tenderness: There is no abdominal tenderness.  Neurological:     General: No focal deficit present.     Mental Status: She is alert and oriented to person, place, and time.         Assessment & Plan:   1. Type 2 diabetes mellitus with other specified complication, without long-term current use of insulin (HCC) Improved A1c and now at goal. Continue  - POCT glycosylated hemoglobin (Hb A1C)  2. Hypertension associated with diabetes (HCC) Appears  stable. Continue   3. Hyperlipidemia associated with type 2 diabetes mellitus (HCC) Continue   4. Morbid obesity (HCC)    No follow-ups on file.   Tommie Raymond, MD

## 2023-03-16 ENCOUNTER — Other Ambulatory Visit: Payer: Self-pay | Admitting: Family Medicine

## 2023-03-17 DIAGNOSIS — Z6841 Body Mass Index (BMI) 40.0 and over, adult: Secondary | ICD-10-CM | POA: Diagnosis not present

## 2023-03-17 DIAGNOSIS — I152 Hypertension secondary to endocrine disorders: Secondary | ICD-10-CM | POA: Diagnosis not present

## 2023-03-17 DIAGNOSIS — G8929 Other chronic pain: Secondary | ICD-10-CM | POA: Diagnosis not present

## 2023-03-17 DIAGNOSIS — K5903 Drug induced constipation: Secondary | ICD-10-CM | POA: Diagnosis not present

## 2023-03-17 DIAGNOSIS — M546 Pain in thoracic spine: Secondary | ICD-10-CM | POA: Diagnosis not present

## 2023-03-17 DIAGNOSIS — E1169 Type 2 diabetes mellitus with other specified complication: Secondary | ICD-10-CM | POA: Diagnosis not present

## 2023-03-17 DIAGNOSIS — E65 Localized adiposity: Secondary | ICD-10-CM | POA: Diagnosis not present

## 2023-03-19 ENCOUNTER — Ambulatory Visit: Payer: Medicaid Other | Admitting: Orthopedic Surgery

## 2023-03-25 DIAGNOSIS — G4733 Obstructive sleep apnea (adult) (pediatric): Secondary | ICD-10-CM | POA: Diagnosis not present

## 2023-04-01 ENCOUNTER — Other Ambulatory Visit: Payer: Self-pay | Admitting: Family Medicine

## 2023-04-01 DIAGNOSIS — Z9109 Other allergy status, other than to drugs and biological substances: Secondary | ICD-10-CM

## 2023-04-02 NOTE — Telephone Encounter (Signed)
Requested Prescriptions  Pending Prescriptions Disp Refills   cetirizine (ZYRTEC) 10 MG tablet [Pharmacy Med Name: Cetirizine HCl 10 MG Oral Tablet] 90 tablet 0    Sig: Take 1 tablet by mouth once daily     Ear, Nose, and Throat:  Antihistamines 2 Passed - 04/01/2023  9:07 AM      Passed - Cr in normal range and within 360 days    Creatinine  Date Value Ref Range Status  10/14/2021 0.70 0.44 - 1.00 mg/dL Final   Creat  Date Value Ref Range Status  09/30/2016 0.64 0.50 - 1.10 mg/dL Final   Creatinine, Ser  Date Value Ref Range Status  11/14/2022 0.57 0.57 - 1.00 mg/dL Final         Passed - Valid encounter within last 12 months    Recent Outpatient Visits           3 weeks ago Type 2 diabetes mellitus with other specified complication, without long-term current use of insulin (HCC)   [place] [facility], MD   4 months ago Annual physical exam   [place] [facility], MD   7 months ago Essential hypertension   [place] [facility], MD       Future Appointments             In 2 months Georganna Skeans, MD [facility]

## 2023-05-04 ENCOUNTER — Other Ambulatory Visit: Payer: Self-pay | Admitting: Family Medicine

## 2023-05-29 ENCOUNTER — Other Ambulatory Visit: Payer: Self-pay | Admitting: Family Medicine

## 2023-05-29 DIAGNOSIS — I10 Essential (primary) hypertension: Secondary | ICD-10-CM

## 2023-05-29 DIAGNOSIS — E119 Type 2 diabetes mellitus without complications: Secondary | ICD-10-CM

## 2023-06-01 ENCOUNTER — Other Ambulatory Visit: Payer: Self-pay | Admitting: Family Medicine

## 2023-06-04 DIAGNOSIS — E7849 Other hyperlipidemia: Secondary | ICD-10-CM | POA: Diagnosis not present

## 2023-06-04 DIAGNOSIS — Z6841 Body Mass Index (BMI) 40.0 and over, adult: Secondary | ICD-10-CM | POA: Diagnosis not present

## 2023-06-04 DIAGNOSIS — E1169 Type 2 diabetes mellitus with other specified complication: Secondary | ICD-10-CM | POA: Diagnosis not present

## 2023-06-04 DIAGNOSIS — G4733 Obstructive sleep apnea (adult) (pediatric): Secondary | ICD-10-CM | POA: Diagnosis not present

## 2023-06-04 DIAGNOSIS — Z9189 Other specified personal risk factors, not elsewhere classified: Secondary | ICD-10-CM | POA: Diagnosis not present

## 2023-06-04 DIAGNOSIS — I152 Hypertension secondary to endocrine disorders: Secondary | ICD-10-CM | POA: Diagnosis not present

## 2023-06-04 DIAGNOSIS — E66813 Obesity, class 3: Secondary | ICD-10-CM | POA: Diagnosis not present

## 2023-06-10 ENCOUNTER — Encounter: Payer: Self-pay | Admitting: Family Medicine

## 2023-06-10 ENCOUNTER — Ambulatory Visit: Payer: Medicaid Other | Admitting: Family Medicine

## 2023-06-10 VITALS — BP 111/75 | HR 91 | Temp 98.8°F | Resp 18 | Ht 63.0 in | Wt 262.4 lb

## 2023-06-10 DIAGNOSIS — E1169 Type 2 diabetes mellitus with other specified complication: Secondary | ICD-10-CM

## 2023-06-10 DIAGNOSIS — I10 Essential (primary) hypertension: Secondary | ICD-10-CM | POA: Diagnosis not present

## 2023-06-10 DIAGNOSIS — Z6841 Body Mass Index (BMI) 40.0 and over, adult: Secondary | ICD-10-CM | POA: Diagnosis not present

## 2023-06-10 DIAGNOSIS — Z7985 Long-term (current) use of injectable non-insulin antidiabetic drugs: Secondary | ICD-10-CM

## 2023-06-10 DIAGNOSIS — E785 Hyperlipidemia, unspecified: Secondary | ICD-10-CM | POA: Diagnosis not present

## 2023-06-10 DIAGNOSIS — E66813 Obesity, class 3: Secondary | ICD-10-CM | POA: Diagnosis not present

## 2023-06-10 LAB — POCT GLYCOSYLATED HEMOGLOBIN (HGB A1C): Hemoglobin A1C: 6.3 % — AB (ref 4.0–5.6)

## 2023-06-10 NOTE — Progress Notes (Signed)
Established Patient Office Visit  Subjective    Patient ID: Dorothy Haynes, female    DOB: June 04, 1986  Age: 37 y.o. MRN: [id number]  CC:  Chief Complaint  Patient presents with   Follow-up    3 month     HPI Dorothy Haynes presents for follow up of chronic med issues. Patient denies acute complaints.   Outpatient Encounter Medications as of 06/10/2023  Medication Sig   calcium-vitamin D (OSCAL WITH D) 500-200 MG-UNIT tablet Take 1 tablet by mouth 2 (two) times daily.   cetirizine (ZYRTEC) 10 MG tablet Take 1 tablet by mouth once daily   ibuprofen (ADVIL) 800 MG tablet Take 1 tablet (800 mg total) by mouth 3 (three) times daily.   losartan (COZAAR) 25 MG tablet TAKE 1 TABLET BY MOUTH AT BEDTIME   MOUNJARO 15 MG/0.5ML Pen Inject 15 mg into the skin every Saturday.   oxyCODONE-acetaminophen (PERCOCET/ROXICET) 5-325 MG tablet Take 1 tablet by mouth every 4 (four) hours as needed for moderate pain.   rosuvastatin (CRESTOR) 10 MG tablet TAKE 1 TABLET BY MOUTH AT BEDTIME   tirzepatide (MOUNJARO) 12.5 MG/0.5ML Pen Inject 12.5 mg into the skin once a week.   VENTOLIN HFA 108 (90 Base) MCG/ACT inhaler Inhale 1-2 puffs into the lungs every 4 (four) hours as needed for shortness of breath.   pregabalin (LYRICA) 75 MG capsule Take 1 capsule (75 mg total) by mouth 2 (two) times daily.   No facility-administered encounter medications on file as of 06/10/2023.    Past Medical History:  Diagnosis Date   Anxiety    Back pain    Diabetes (HCC)    Diverticulitis 03/15/2018   Gallbladder problem    GERD (gastroesophageal reflux disease) 2009   Gestational diabetes 2008   Headache    History of gallstones 10/2015   s/p cholecystectomy 10/09/15   Hyperlipidemia    Hypertension    Joint pain    Menorrhagia with irregular cycle 10/23/2016   Morbid obesity (HCC) 10/09/2015   Physical deconditioning 06/22/2019   Plantar fasciitis, bilateral 06/02/2019   Shortness of breath dyspnea    Sleep  apnea    TMJ (dislocation of temporomandibular joint), initial encounter 06/02/2019   Tobacco abuse 10/09/2015    Past Surgical History:  Procedure Laterality Date   CHOLECYSTECTOMY N/A 10/09/2015   Procedure: LAPAROSCOPIC CHOLECYSTECTOMY ;  Surgeon: Claud Kelp, MD;  Location: [facility];  Service: General;  Laterality: N/A;   VAGINAL HYSTERECTOMY Bilateral 09/30/2022   Procedure: HYSTERECTOMY VAGINAL WITH SALPINGECTOMY;  Surgeon: Hermina Staggers, MD;  Location: [facility] OR;  Service: Gynecology;  Laterality: Bilateral;   WISDOM TOOTH EXTRACTION      Family History  Problem Relation Age of Onset   Drug abuse Mother    Heart disease Mother    Diabetes Mother    Hypertension Mother    Cataracts Mother    Cancer Father    Throat cancer Father    Kidney Stones Father    Alcoholism Father    Drug abuse Father    Diabetes Maternal Grandmother    Diabetes Maternal Grandfather    Blindness Maternal Aunt    Alzheimer's disease Other    Diabetes Other    Hypertension Other    Heart disease Other    Heart disease Other    Diabetes Other    Hypertension Other    Thyroid disease Other    Amblyopia Neg Hx    Glaucoma Neg Hx    Macular  degeneration Neg Hx    Retinal detachment Neg Hx    Strabismus Neg Hx    Retinitis pigmentosa Neg Hx     Social History   Socioeconomic History   Marital status: Single    Spouse name: Not on file   Number of children: 1   Years of education: college graduate   Highest education level: Not on file  Occupational History    Employer: [employer]    Comment: part-time   Occupation: Press photographer  Tobacco Use   Smoking status: Former    Current packs/day: 0.00    Average packs/day: 0.2 packs/day for 17.0 years (3.4 ttl pk-yrs)    Types: Cigarettes    Start date: 03/01/2001    Quit date: 03/01/2018    Years since quitting: 5.2   Smokeless tobacco: Never  Vaping Use   Vaping status: Never Used  Substance and Sexual Activity   Alcohol  use: Not Currently    Comment: rarely   Drug use: No    Comment: previously used MJ, has been off recreational drugs   Sexual activity: Yes    Partners: Male    Birth control/protection: Injection  Other Topics Concern   Not on file  Social History Narrative   Lives with mother and son   Social Determinants of Health   Financial Resource Strain: Low Risk  (06/10/2023)   Overall Financial Resource Strain (CARDIA)    Difficulty of Paying Living Expenses: Not hard at all  Food Insecurity: No Food Insecurity (06/10/2023)   Hunger Vital Sign    Worried About Running Out of Food in the Last Year: Never true    Ran Out of Food in the Last Year: Never true  Transportation Needs: No Transportation Needs (06/10/2023)   PRAPARE - Administrator, Civil Service (Medical): No    Lack of Transportation (Non-Medical): No  Physical Activity: Sufficiently Active (06/10/2023)   Exercise Vital Sign    Days of Exercise per Week: 5 days    Minutes of Exercise per Session: 30 min  Stress: No Stress Concern Present (06/10/2023)   [employer] of Occupational Health - Occupational Stress Questionnaire    Feeling of Stress : Not at all  Social Connections: Socially Isolated (06/10/2023)   Social Connection and Isolation Panel [NHANES]    Frequency of Communication with Friends and Family: More than three times a week    Frequency of Social Gatherings with Friends and Family: More than three times a week    Attends Religious Services: Never    Database administrator or Organizations: No    Attends Banker Meetings: Never    Marital Status: Never married  Intimate Partner Violence: Not At Risk (06/10/2023)   Humiliation, Afraid, Rape, and Kick questionnaire    Fear of Current or Ex-Partner: No    Emotionally Abused: No    Physically Abused: No    Sexually Abused: No    Review of Systems  All other systems reviewed and are negative.       Objective    BP 111/75 (BP  Location: Right Arm, Patient Position: Sitting, Cuff Size: Large)   Pulse 91   Temp 98.8 F (37.1 C) (Oral)   Resp 18   Ht 5\' 3"  (1.6 m)   Wt 262 lb 6.4 oz (119 kg)   SpO2 95%   BMI 46.48 kg/m   Physical Exam Vitals and nursing note reviewed.  Constitutional:  General: She is not in acute distress.    Appearance: She is obese.  Cardiovascular:     Rate and Rhythm: Normal rate and regular rhythm.  Pulmonary:     Effort: Pulmonary effort is normal.     Breath sounds: Normal breath sounds.  Abdominal:     Palpations: Abdomen is soft.     Tenderness: There is no abdominal tenderness.  Neurological:     General: No focal deficit present.     Mental Status: She is alert and oriented to person, place, and time.         Assessment & Plan:   Essential hypertension  Type 2 diabetes mellitus with other specified complication, without long-term current use of insulin (HCC) -     POCT glycosylated hemoglobin (Hb A1C)  Hyperlipidemia associated with type 2 diabetes mellitus (HCC)  Morbid obesity (HCC)     Return in about 3 months (around 09/10/2023) for follow up.   Tommie Raymond, MD

## 2023-06-12 ENCOUNTER — Encounter: Payer: Self-pay | Admitting: Family Medicine

## 2023-06-16 ENCOUNTER — Emergency Department (HOSPITAL_BASED_OUTPATIENT_CLINIC_OR_DEPARTMENT_OTHER): Payer: Medicaid Other

## 2023-06-16 ENCOUNTER — Emergency Department (HOSPITAL_BASED_OUTPATIENT_CLINIC_OR_DEPARTMENT_OTHER)
Admission: EM | Admit: 2023-06-16 | Discharge: 2023-06-16 | Disposition: A | Discharge status: Home / Self Care | Payer: Medicaid Other | Attending: Emergency Medicine | Admitting: Emergency Medicine

## 2023-06-16 ENCOUNTER — Other Ambulatory Visit: Payer: Self-pay

## 2023-06-16 ENCOUNTER — Encounter (HOSPITAL_BASED_OUTPATIENT_CLINIC_OR_DEPARTMENT_OTHER): Payer: Self-pay | Admitting: Emergency Medicine

## 2023-06-16 ENCOUNTER — Other Ambulatory Visit (HOSPITAL_BASED_OUTPATIENT_CLINIC_OR_DEPARTMENT_OTHER): Payer: Self-pay

## 2023-06-16 DIAGNOSIS — Z1152 Encounter for screening for COVID-19: Secondary | ICD-10-CM | POA: Insufficient documentation

## 2023-06-16 DIAGNOSIS — R519 Headache, unspecified: Secondary | ICD-10-CM | POA: Insufficient documentation

## 2023-06-16 DIAGNOSIS — J069 Acute upper respiratory infection, unspecified: Secondary | ICD-10-CM | POA: Insufficient documentation

## 2023-06-16 DIAGNOSIS — Z79899 Other long term (current) drug therapy: Secondary | ICD-10-CM | POA: Insufficient documentation

## 2023-06-16 DIAGNOSIS — E119 Type 2 diabetes mellitus without complications: Secondary | ICD-10-CM | POA: Diagnosis not present

## 2023-06-16 DIAGNOSIS — I10 Essential (primary) hypertension: Secondary | ICD-10-CM | POA: Diagnosis not present

## 2023-06-16 DIAGNOSIS — J029 Acute pharyngitis, unspecified: Secondary | ICD-10-CM | POA: Diagnosis present

## 2023-06-16 LAB — SARS CORONAVIRUS 2 BY RT PCR: SARS Coronavirus 2 by RT PCR: NEGATIVE

## 2023-06-16 LAB — GROUP A STREP BY PCR: Group A Strep by PCR: NOT DETECTED

## 2023-06-16 MED ORDER — AZITHROMYCIN 250 MG PO TABS: 250.0000 mg | ORAL_TABLET | Freq: Every day | ORAL | 0 refills | Status: DC

## 2023-06-16 MED ORDER — KETOROLAC TROMETHAMINE 30 MG/ML IJ SOLN
60.0000 mg | Freq: Once | INTRAMUSCULAR | Status: AC
  Administered 2023-06-16: 60 mg via INTRAMUSCULAR
  Filled 2023-06-16: qty 2

## 2023-06-16 NOTE — ED Provider Notes (Signed)
[place] EMERGENCY DEPARTMENT AT [facility] Provider Note   CSN: [id number] Arrival date & time: 06/16/23  [id number]     History  Chief Complaint  Patient presents with   Sore Throat    Dorothy Haynes is a 37 y.o. female.  She has a history of hypertension diabetes.  Complaining of general headache sore throat cough that started 2 days ago.  She thinks she has had a fever.  No vomiting or diarrhea.  51-year-old son sick with URI symptoms.  Tried some over-the-counter TheraFlu without improvement.  The history is provided by the patient.  URI Presenting symptoms: congestion, cough, fever and sore throat   Severity:  Moderate Onset quality:  Gradual Duration:  3 days Timing:  Constant Progression:  Unchanged Chronicity:  New Relieved by:  Nothing Worsened by:  Eating Ineffective treatments:  OTC medications Associated symptoms: headaches   Risk factors: sick contacts   Risk factors: no recent travel        Home Medications Prior to Admission medications   Medication Sig Start Date End Date Taking? Authorizing Provider  calcium-vitamin D (OSCAL WITH D) 500-200 MG-UNIT tablet Take 1 tablet by mouth 2 (two) times daily. 10/14/19   Marny Lowenstein, PA-C  cetirizine (ZYRTEC) 10 MG tablet Take 1 tablet by mouth once daily 04/02/23   Georganna Skeans, MD  ibuprofen (ADVIL) 800 MG tablet Take 1 tablet (800 mg total) by mouth 3 (three) times daily. 10/01/22   Hermina Staggers, MD  losartan (COZAAR) 25 MG tablet TAKE 1 TABLET BY MOUTH AT BEDTIME 05/29/23   Georganna Skeans, MD  [facility] 15 MG/0.5ML Pen Inject 15 mg into the skin every Saturday. 09/01/22   [provider]  oxyCODONE-acetaminophen (PERCOCET/ROXICET) 5-325 MG tablet Take 1 tablet by mouth every 4 (four) hours as needed for moderate pain. 10/01/22   Hermina Staggers, MD  pregabalin (LYRICA) 75 MG capsule Take 1 capsule (75 mg total) by mouth 2 (two) times daily. 01/15/23 03/16/23  London Sheer, MD  rosuvastatin  (CRESTOR) 10 MG tablet TAKE 1 TABLET BY MOUTH AT BEDTIME 06/01/23   Georganna Skeans, MD  tirzepatide [facility]) 12.5 MG/0.5ML Pen Inject 12.5 mg into the skin once a week. 12/16/22     VENTOLIN HFA 108 (90 Base) MCG/ACT inhaler Inhale 1-2 puffs into the lungs every 4 (four) hours as needed for shortness of breath. 07/30/21   [provider]      Allergies    Patient has no known allergies.    Review of Systems   Review of Systems  Constitutional:  Positive for fever.  HENT:  Positive for congestion and sore throat.   Respiratory:  Positive for cough.   Cardiovascular:  Negative for chest pain.  Neurological:  Positive for headaches.    Physical Exam Updated Vital Signs BP (!) 138/97 (BP Location: Right Arm)   Pulse 100   Temp 98.7 F (37.1 C) (Oral)   Resp 16   Ht 5\' 3"  (1.6 m)   Wt 119 kg   SpO2 100%   BMI 46.47 kg/m  Physical Exam Vitals and nursing note reviewed.  Constitutional:      General: She is not in acute distress.    Appearance: She is well-developed.  HENT:     Head: Normocephalic and atraumatic.     Right Ear: Tympanic membrane normal.     Left Ear: Tympanic membrane normal.     Nose: Rhinorrhea present.     Mouth/Throat:  Mouth: Mucous membranes are moist.     Pharynx: Posterior oropharyngeal erythema present. No oropharyngeal exudate.     Tonsils: No tonsillar exudate or tonsillar abscesses.  Eyes:     Conjunctiva/sclera: Conjunctivae normal.  Cardiovascular:     Rate and Rhythm: Normal rate and regular rhythm.     Heart sounds: No murmur heard. Pulmonary:     Effort: Pulmonary effort is normal. No respiratory distress.     Breath sounds: Normal breath sounds.  Abdominal:     Palpations: Abdomen is soft.     Tenderness: There is no abdominal tenderness.  Musculoskeletal:        General: No swelling.     Cervical back: Neck supple.  Skin:    General: Skin is warm and dry.     Capillary Refill: Capillary refill takes less than 2  seconds.  Neurological:     General: No focal deficit present.     Mental Status: She is alert.     ED Results / Procedures / Treatments   Labs (all labs ordered are listed, but only abnormal results are displayed) Labs Reviewed  GROUP A STREP BY PCR  SARS CORONAVIRUS 2 BY RT PCR    EKG None  Radiology DG Chest Port 1 View  Result Date: 06/16/2023 CLINICAL DATA:  Cough. EXAM: PORTABLE CHEST 1 VIEW COMPARISON:  Chest radiograph dated February 10, 2017. FINDINGS: The heart size and mediastinal contours are within normal limits. No focal consolidation, pneumothorax, or pleural effusion. Arthritic changes of the right acromioclavicular joint. No acute osseous abnormality. IMPRESSION: No acute cardiopulmonary findings. Electronically Signed   By: Hart Robinsons M.D.   On: 06/16/2023 10:28    Procedures Procedures    Medications Ordered in ED Medications  ketorolac (TORADOL) 30 MG/ML injection 60 mg (has no administration in time range)    ED Course/ Medical Decision Making/ A&P                                 Medical Decision Making Amount and/or Complexity of Data Reviewed Radiology: ordered.  Risk Prescription drug management.   This patient complains of headache sore throat cough malaise; this involves an extensive number of treatment Options and is a complaint that carries with it a high risk of complications and morbidity. The differential includes viral syndrome, COVID, pneumonia, strep throat, bronchitis  I ordered, reviewed and interpreted labs, which included COVID and strep negative I ordered medication IM Toradol and reviewed PMP when indicated. I ordered imaging studies which included chest x-ray and I independently    visualized and interpreted imaging which showed no acute infiltrate Previous records obtained and reviewed in epic no recent admissions  Cardiac monitoring reviewed, sinus rhythm Social determinants considered, socially isolated Critical  Interventions: None  After the interventions stated above, I reevaluated the patient and found patient to be well-appearing in no distress Admission and further testing considered, no indications for admission or further workup at this time.  Will cover with antibiotics though this is likely viral.  Recommended continued symptomatic treatment and PCP follow-up.  Return instructions discussed         Final Clinical Impression(s) / ED Diagnoses Final diagnoses:  Acute upper respiratory infection  Generalized headache    Rx / DC Orders ED Discharge Orders     None         Terrilee Files, MD 06/16/23 1707

## 2023-06-16 NOTE — Discharge Instructions (Addendum)
You were seen for headache sore throat cough.  Your COVID and strep test were negative.  Your chest x-ray did not show any obvious pneumonia.  You were treated with some anti-inflammatory medication and a prescription for an antibiotic.  Please drink plenty of fluids and follow-up with your primary care doctor.  Return to the emergency department if any worsening or concerning symptoms

## 2023-06-16 NOTE — ED Triage Notes (Signed)
Sore throat, cough and h/a  since sunday

## 2023-06-26 ENCOUNTER — Other Ambulatory Visit: Payer: Self-pay | Admitting: Family Medicine

## 2023-06-26 DIAGNOSIS — Z9109 Other allergy status, other than to drugs and biological substances: Secondary | ICD-10-CM

## 2023-07-02 DIAGNOSIS — Z6841 Body Mass Index (BMI) 40.0 and over, adult: Secondary | ICD-10-CM | POA: Diagnosis not present

## 2023-07-02 DIAGNOSIS — E66813 Obesity, class 3: Secondary | ICD-10-CM | POA: Diagnosis not present

## 2023-07-02 DIAGNOSIS — I152 Hypertension secondary to endocrine disorders: Secondary | ICD-10-CM | POA: Diagnosis not present

## 2023-07-02 DIAGNOSIS — E1169 Type 2 diabetes mellitus with other specified complication: Secondary | ICD-10-CM | POA: Diagnosis not present

## 2023-07-02 DIAGNOSIS — E7849 Other hyperlipidemia: Secondary | ICD-10-CM | POA: Diagnosis not present

## 2023-08-24 DIAGNOSIS — I152 Hypertension secondary to endocrine disorders: Secondary | ICD-10-CM | POA: Diagnosis not present

## 2023-08-24 DIAGNOSIS — Z6841 Body Mass Index (BMI) 40.0 and over, adult: Secondary | ICD-10-CM | POA: Diagnosis not present

## 2023-08-24 DIAGNOSIS — E1169 Type 2 diabetes mellitus with other specified complication: Secondary | ICD-10-CM | POA: Diagnosis not present

## 2023-08-24 DIAGNOSIS — E66813 Obesity, class 3: Secondary | ICD-10-CM | POA: Diagnosis not present

## 2023-09-05 ENCOUNTER — Other Ambulatory Visit: Payer: Self-pay | Admitting: Family Medicine

## 2023-09-05 DIAGNOSIS — Z9109 Other allergy status, other than to drugs and biological substances: Secondary | ICD-10-CM

## 2023-09-09 DIAGNOSIS — Z658 Other specified problems related to psychosocial circumstances: Secondary | ICD-10-CM | POA: Diagnosis not present

## 2023-09-09 DIAGNOSIS — R5383 Other fatigue: Secondary | ICD-10-CM | POA: Diagnosis not present

## 2023-09-09 DIAGNOSIS — Z6841 Body Mass Index (BMI) 40.0 and over, adult: Secondary | ICD-10-CM | POA: Diagnosis not present

## 2023-09-09 DIAGNOSIS — G4733 Obstructive sleep apnea (adult) (pediatric): Secondary | ICD-10-CM | POA: Diagnosis not present

## 2023-09-09 DIAGNOSIS — E1169 Type 2 diabetes mellitus with other specified complication: Secondary | ICD-10-CM | POA: Diagnosis not present

## 2023-09-15 ENCOUNTER — Ambulatory Visit (INDEPENDENT_AMBULATORY_CARE_PROVIDER_SITE_OTHER): Payer: Medicaid Other | Admitting: Family Medicine

## 2023-09-15 ENCOUNTER — Encounter: Payer: Self-pay | Admitting: Family Medicine

## 2023-09-15 VITALS — BP 121/86 | HR 94 | Resp 16 | Ht 63.0 in | Wt 263.0 lb

## 2023-09-15 DIAGNOSIS — E785 Hyperlipidemia, unspecified: Secondary | ICD-10-CM | POA: Diagnosis not present

## 2023-09-15 DIAGNOSIS — E66813 Obesity, class 3: Secondary | ICD-10-CM | POA: Diagnosis not present

## 2023-09-15 DIAGNOSIS — Z7985 Long-term (current) use of injectable non-insulin antidiabetic drugs: Secondary | ICD-10-CM

## 2023-09-15 DIAGNOSIS — E1169 Type 2 diabetes mellitus with other specified complication: Secondary | ICD-10-CM

## 2023-09-15 DIAGNOSIS — Z6841 Body Mass Index (BMI) 40.0 and over, adult: Secondary | ICD-10-CM | POA: Diagnosis not present

## 2023-09-15 DIAGNOSIS — I10 Essential (primary) hypertension: Secondary | ICD-10-CM | POA: Diagnosis not present

## 2023-09-15 LAB — POCT GLYCOSYLATED HEMOGLOBIN (HGB A1C): Hemoglobin A1C: 6.6 % — AB (ref 4.0–5.6)

## 2023-09-15 NOTE — Progress Notes (Signed)
 Established Patient Office Visit  Subjective    Patient ID: Dorothy Haynes, female    DOB: 1986-08-21  Age: 38 y.o. MRN: [id number]  CC:  Chief Complaint  Patient presents with   Medical Management of Chronic Issues    HPI [name] presents for routine follow up of chronic med issues including diabetes and hypertension. Patient reports that she was not as compliant with her regimen over the holidays but denies acute complaints.   Outpatient Encounter Medications as of 09/15/2023  Medication Sig   azithromycin  (ZITHROMAX ) 250 MG tablet Take 1 tablet (250 mg total) by mouth daily. Take first 2 tablets together, then 1 every day until finished.   calcium -vitamin D  (OSCAL WITH D) 500-200 MG-UNIT tablet Take 1 tablet by mouth 2 (two) times daily.   EQ ALLERGY RELIEF, CETIRIZINE , 10 MG tablet Take 1 tablet by mouth once daily   ibuprofen  (ADVIL ) 800 MG tablet Take 1 tablet (800 mg total) by mouth 3 (three) times daily.   losartan  (COZAAR ) 25 MG tablet TAKE 1 TABLET BY MOUTH AT BEDTIME   MOUNJARO  15 MG/0.5ML Pen Inject 15 mg into the skin every Saturday.   oxyCODONE -acetaminophen  (PERCOCET/ROXICET) 5-325 MG tablet Take 1 tablet by mouth every 4 (four) hours as needed for moderate pain.   Oyster Shell Calcium  500 MG TABS 1 tablet with meals Orally once daily.   phentermine (ADIPEX-P) 37.5 MG tablet Take 18.75-37.5 mg by mouth daily.   rosuvastatin  (CRESTOR ) 10 MG tablet TAKE 1 TABLET BY MOUTH AT BEDTIME   tirzepatide  (MOUNJARO ) 12.5 MG/0.5ML Pen Inject 12.5 mg into the skin once a week.   tirzepatide  (MOUNJARO ) 15 MG/0.5ML Pen Inject 15mg  under the skin once weekly. (Can fill 12.5mg  or 15; whichever is available) for 90 days   VENTOLIN  HFA 108 (90 Base) MCG/ACT inhaler Inhale 1-2 puffs into the lungs every 4 (four) hours as needed for shortness of breath.   pregabalin  (LYRICA ) 75 MG capsule Take 1 capsule (75 mg total) by mouth 2 (two) times daily.   No facility-administered  encounter medications on file as of 09/15/2023.    Past Medical History:  Diagnosis Date   Anxiety    Back pain    Diabetes (HCC)    Diverticulitis 03/15/2018   Gallbladder problem    GERD (gastroesophageal reflux disease) 2009   Gestational diabetes 2008   Headache    History of gallstones 10/2015   s/p cholecystectomy 10/09/15   Hyperlipidemia    Hypertension    Joint pain    Menorrhagia with irregular cycle 10/23/2016   Morbid obesity (HCC) 10/09/2015   Physical deconditioning 06/22/2019   Plantar fasciitis, bilateral 06/02/2019   Shortness of breath dyspnea    Sleep apnea    TMJ (dislocation of temporomandibular joint), initial encounter 06/02/2019   Tobacco abuse 10/09/2015    Past Surgical History:  Procedure Laterality Date   CHOLECYSTECTOMY N/A 10/09/2015   Procedure: LAPAROSCOPIC CHOLECYSTECTOMY ;  Surgeon: Elon Pacini, MD;  Location: [facility];  Service: General;  Laterality: N/A;   VAGINAL HYSTERECTOMY Bilateral 09/30/2022   Procedure: HYSTERECTOMY VAGINAL WITH SALPINGECTOMY;  Surgeon: Lorence Ozell LITTIE, MD;  Location: [facility] OR;  Service: Gynecology;  Laterality: Bilateral;   WISDOM TOOTH EXTRACTION      Family History  Problem Relation Age of Onset   Drug abuse Mother    Heart disease Mother    Diabetes Mother    Hypertension Mother    Cataracts Mother    Cancer Father  Throat cancer Father    Kidney Stones Father    Alcoholism Father    Drug abuse Father    Diabetes Maternal Grandmother    Diabetes Maternal Grandfather    Blindness Maternal Aunt    Alzheimer's disease Other    Diabetes Other    Hypertension Other    Heart disease Other    Heart disease Other    Diabetes Other    Hypertension Other    Thyroid  disease Other    Amblyopia Neg Hx    Glaucoma Neg Hx    Macular degeneration Neg Hx    Retinal detachment Neg Hx    Strabismus Neg Hx    Retinitis pigmentosa Neg Hx     Social History   Socioeconomic History   Marital status: Single     Spouse name: Not on file   Number of children: 1   Years of education: college graduate   Highest education level: Not on file  Occupational History    Employer: [employer]    Comment: part-time   Occupation: Press photographer  Tobacco Use   Smoking status: Former    Current packs/day: 0.00    Average packs/day: 0.2 packs/day for 17.0 years (3.4 ttl pk-yrs)    Types: Cigarettes    Start date: 03/01/2001    Quit date: 03/01/2018    Years since quitting: 5.5   Smokeless tobacco: Never  Vaping Use   Vaping status: Never Used  Substance and Sexual Activity   Alcohol use: Not Currently    Comment: rarely   Drug use: No    Comment: previously used MJ, has been off recreational drugs   Sexual activity: Yes    Partners: Male    Birth control/protection: Injection  Other Topics Concern   Not on file  Social History Narrative   Lives with mother and son   Social Drivers of Health   Financial Resource Strain: Low Risk  (06/10/2023)   Overall Financial Resource Strain (CARDIA)    Difficulty of Paying Living Expenses: Not hard at all  Food Insecurity: No Food Insecurity (06/10/2023)   Hunger Vital Sign    Worried About Running Out of Food in the Last Year: Never true    Ran Out of Food in the Last Year: Never true  Transportation Needs: No Transportation Needs (06/10/2023)   PRAPARE - Administrator, Civil Service (Medical): No    Lack of Transportation (Non-Medical): No  Physical Activity: Sufficiently Active (06/10/2023)   Exercise Vital Sign    Days of Exercise per Week: 5 days    Minutes of Exercise per Session: 30 min  Stress: No Stress Concern Present (06/10/2023)   [employer] of Occupational Health - Occupational Stress Questionnaire    Feeling of Stress : Not at all  Social Connections: Socially Isolated (06/10/2023)   Social Connection and Isolation Panel [NHANES]    Frequency of Communication with Friends and Family: More than three times a week     Frequency of Social Gatherings with Friends and Family: More than three times a week    Attends Religious Services: Never    Database Administrator or Organizations: No    Attends Banker Meetings: Never    Marital Status: Never married  Intimate Partner Violence: Not At Risk (06/10/2023)   Humiliation, Afraid, Rape, and Kick questionnaire    Fear of Current or Ex-Partner: No    Emotionally Abused: No    Physically Abused: No  Sexually Abused: No    Review of Systems  All other systems reviewed and are negative.       Objective    BP 121/86   Pulse 94   Resp 16   Ht 5' 3 (1.6 m)   Wt 263 lb (119.3 kg)   SpO2 97%   BMI 46.59 kg/m   Physical Exam Vitals and nursing note reviewed.  Constitutional:      General: She is not in acute distress.    Appearance: She is obese.  Cardiovascular:     Rate and Rhythm: Normal rate and regular rhythm.  Pulmonary:     Effort: Pulmonary effort is normal.     Breath sounds: Normal breath sounds.  Abdominal:     Palpations: Abdomen is soft.     Tenderness: There is no abdominal tenderness.  Neurological:     General: No focal deficit present.     Mental Status: She is alert and oriented to person, place, and time.         Assessment & Plan:   1. Type 2 diabetes mellitus with other specified complication, without long-term current use of insulin  (HCC) (Primary) Slight increase of A1c but remains at goal. Continue  - POCT glycosylated hemoglobin (Hb A1C)  2. Essential hypertension Appears stable. Continue   3. Hyperlipidemia associated with type 2 diabetes mellitus (HCC) Continue   4. Class 3 severe obesity due to excess calories with serious comorbidity and body mass index (BMI) of 45.0 to 49.9 in adult [facility]) Discussed dietary and activity options.      Return for follow up, chronic med issues.   Tanda Raguel SQUIBB, MD

## 2023-09-15 NOTE — Progress Notes (Signed)
 Patient is here for their 3 month follow-up Patient c/o of shoulder pain,chest bone, and spider veins . Care gaps have been discussed with patient

## 2023-09-16 ENCOUNTER — Encounter: Payer: Self-pay | Admitting: Family Medicine

## 2023-09-23 DIAGNOSIS — G4733 Obstructive sleep apnea (adult) (pediatric): Secondary | ICD-10-CM | POA: Diagnosis not present

## 2023-10-01 NOTE — Progress Notes (Signed)
[facility] - Clinic Note  10/13/2023     CHIEF COMPLAINT Patient presents for Retina Follow Up   HISTORY OF PRESENT ILLNESS: Dorothy Haynes is a 38 y.o. female who presents to the clinic today for:   HPI     Retina Follow Up   Patient presents with  Other.  In both eyes.  This started 1 year ago.  I, the attending physician,  performed the HPI with the patient and updated documentation appropriately.        Comments   Patient here for 1 year retina follow up for DM exam. Patient state vision doing fine. No eye pain.      Last edited by Rennis Chris, MD on 10/13/2023  3:53 PM.    Pt states she is not having any problems with her vision, she is still on Mounjaro, she is taking phentermine to try to lose weight  Referring physician: Georganna Skeans, MD [address] [place],  [place] [address]  HISTORICAL INFORMATION:   Selected notes from the MEDICAL RECORD NUMBER Referred by Dr. Jeneen Rinks for DM exam LEE:  Ocular Hx- PMH-DM (A1C: 7.9, metformin), HTN, smoker    CURRENT MEDICATIONS: No current outpatient medications on file. (Ophthalmic Drugs)   No current facility-administered medications for this visit. (Ophthalmic Drugs)   Current Outpatient Medications (Other)  Medication Sig   azithromycin (ZITHROMAX) 250 MG tablet Take 1 tablet (250 mg total) by mouth daily. Take first 2 tablets together, then 1 every day until finished.   calcium-vitamin D (OSCAL WITH D) 500-200 MG-UNIT tablet Take 1 tablet by mouth 2 (two) times daily.   EQ ALLERGY RELIEF, CETIRIZINE, 10 MG tablet Take 1 tablet by mouth once daily   ibuprofen (ADVIL) 800 MG tablet Take 1 tablet (800 mg total) by mouth 3 (three) times daily.   losartan (COZAAR) 25 MG tablet TAKE 1 TABLET BY MOUTH AT BEDTIME   MOUNJARO 15 MG/0.5ML Pen Inject 15 mg into the skin every Saturday.   oxyCODONE-acetaminophen (PERCOCET/ROXICET) 5-325 MG tablet Take 1 tablet by mouth every 4 (four)  hours as needed for moderate pain.   Oyster Shell Calcium 500 MG TABS 1 tablet with meals Orally once daily.   phentermine (ADIPEX-P) 37.5 MG tablet Take 18.75-37.5 mg by mouth daily.   rosuvastatin (CRESTOR) 10 MG tablet TAKE 1 TABLET BY MOUTH AT BEDTIME   tirzepatide (MOUNJARO) 12.5 MG/0.5ML Pen Inject 12.5 mg into the skin once a week.   tirzepatide [facility]) 15 MG/0.5ML Pen Inject 15mg  under the skin once weekly. (Can fill 12.5mg  or 15; whichever is available) for 90 days   VENTOLIN HFA 108 (90 Base) MCG/ACT inhaler Inhale 1-2 puffs into the lungs every 4 (four) hours as needed for shortness of breath.   pregabalin (LYRICA) 75 MG capsule Take 1 capsule (75 mg total) by mouth 2 (two) times daily.   No current facility-administered medications for this visit. (Other)   REVIEW OF SYSTEMS: ROS   Positive for: Endocrine, Cardiovascular, Eyes Negative for: Constitutional, Gastrointestinal, Neurological, Skin, Genitourinary, Musculoskeletal, HENT, Respiratory, Psychiatric, Allergic/Imm, Heme/Lymph Last edited by Laddie Aquas, COA on 10/13/2023  1:05 PM.     ALLERGIES No Known Allergies  PAST MEDICAL HISTORY Past Medical History:  Diagnosis Date   Anxiety    Back pain    Diabetes (HCC)    Diverticulitis 03/15/2018   Gallbladder problem    GERD (gastroesophageal reflux disease) 2009   Gestational diabetes 2008   Headache  History of gallstones 10/2015   s/p cholecystectomy 10/09/15   Hyperlipidemia    Hypertension    Joint pain    Menorrhagia with irregular cycle 10/23/2016   Morbid obesity (HCC) 10/09/2015   Physical deconditioning 06/22/2019   Plantar fasciitis, bilateral 06/02/2019   Shortness of breath dyspnea    Sleep apnea    TMJ (dislocation of temporomandibular joint), initial encounter 06/02/2019   Tobacco abuse 10/09/2015   Past Surgical History:  Procedure Laterality Date   CHOLECYSTECTOMY N/A 10/09/2015   Procedure: LAPAROSCOPIC CHOLECYSTECTOMY ;  Surgeon:  Claud Kelp, MD;  Location: [facility];  Service: General;  Laterality: N/A;   VAGINAL HYSTERECTOMY Bilateral 09/30/2022   Procedure: HYSTERECTOMY VAGINAL WITH SALPINGECTOMY;  Surgeon: Hermina Staggers, MD;  Location: [facility] OR;  Service: Gynecology;  Laterality: Bilateral;   WISDOM TOOTH EXTRACTION      FAMILY HISTORY Family History  Problem Relation Age of Onset   Drug abuse Mother    Heart disease Mother    Diabetes Mother    Hypertension Mother    Cataracts Mother    Cancer Father    Throat cancer Father    Kidney Stones Father    Alcoholism Father    Drug abuse Father    Diabetes Maternal Grandmother    Diabetes Maternal Grandfather    Blindness Maternal Aunt    Alzheimer's disease Other    Diabetes Other    Hypertension Other    Heart disease Other    Heart disease Other    Diabetes Other    Hypertension Other    Thyroid disease Other    Amblyopia Neg Hx    Glaucoma Neg Hx    Macular degeneration Neg Hx    Retinal detachment Neg Hx    Strabismus Neg Hx    Retinitis pigmentosa Neg Hx    SOCIAL HISTORY Social History   Tobacco Use   Smoking status: Former    Current packs/day: 0.00    Average packs/day: 0.2 packs/day for 17.0 years (3.4 ttl pk-yrs)    Types: Cigarettes    Start date: 03/01/2001    Quit date: 03/01/2018    Years since quitting: 5.6   Smokeless tobacco: Never  Vaping Use   Vaping status: Never Used  Substance Use Topics   Alcohol use: Not Currently    Comment: rarely   Drug use: No    Comment: previously used MJ, has been off recreational drugs       OPHTHALMIC EXAM:  Base Eye Exam     Visual Acuity (Snellen - Linear)       Right Left   Dist [place] 20/20 20/20         Tonometry (Tonopen, 1:03 PM)       Right Left   Pressure 16 14         Pupils       Dark Light Shape React APD   Right 3 2 Round Brisk None   Left 3 2 Round Brisk None         Visual Fields (Counting fingers)       Left Right    Full Full          Extraocular Movement       Right Left    Full, Ortho Full, Ortho         Neuro/Psych     Oriented x3: Yes   Mood/Affect: Normal         Dilation     Both eyes:  1.0% Mydriacyl, 2.5% Phenylephrine @ 1:03 PM           Slit Lamp and Fundus Exam     External Exam       Right Left   External Normal Normal         Slit Lamp Exam       Right Left   Lids/Lashes Normal Normal   Conjunctiva/Sclera mild Melanosis mild Melanosis   Cornea Clear Clear   Anterior Chamber deep and clear deep and clear   Iris Round and dilated, No NVI Round and dilated, No NVI   Lens 1+ Nuclear sclerosis, 1-2+ Cortical cataract 1+ Nuclear sclerosis, 1-2+ Cortical cataract   Anterior Vitreous Mild Vitreous syneresis Normal         Fundus Exam       Right Left   Disc Pink and Sharp, no NVD Pink and Sharp   C/D Ratio 0.3 0.4   Macula Flat, Good foveal reflex, mild Retinal pigment epithelial mottling, No heme or edema Flat, Good foveal reflex, mild Retinal pigment epithelial mottling, No heme or edema   Vessels attenuated, Tortuous mild tortuosity   Periphery Attached, pigmented peripheral cystoid degenration greatest inferiorly, No heme Attached, pigmented peripheral cystoid degenration greatest inferiorly, No heme            IMAGING AND PROCEDURES  Imaging and Procedures for @TODAY @  OCT, Retina - OU - Both Eyes       Right Eye Quality was good. Central Foveal Thickness: 253. Progression has been stable. Findings include normal foveal contour, no IRF, no SRF, vitreomacular adhesion .   Left Eye Quality was good. Central Foveal Thickness: 252. Progression has been stable. Findings include normal foveal contour, no IRF, no SRF, vitreomacular adhesion .   Notes *Images captured and stored on drive  Diagnosis / Impression:  NFP; no IRF/SRF OU, +VMA OU No DME OU  Clinical management:  See below  Abbreviations: NFP - Normal foveal profile. CME - cystoid macular edema. PED -  pigment epithelial detachment. IRF - intraretinal fluid. SRF - subretinal fluid. EZ - ellipsoid zone. ERM - epiretinal membrane. ORA - outer retinal atrophy. ORT - outer retinal tubulation. SRHM - subretinal hyper-reflective material\             ASSESSMENT/PLAN:   ICD-10-CM   1. Diabetes mellitus type 2 without retinopathy (HCC)  E11.9 OCT, Retina - OU - Both Eyes    2. Essential hypertension  I10     3. Hypertensive retinopathy of both eyes  H35.033     4. Combined forms of age-related cataract of both eyes  H25.813      1. Diabetes mellitus, type 2 without retinopathy  - A1c 6.6 on 01.14.25  - BCVA remains 20/20 OU  - no DME or heme on exam or OCT  - The incidence, risk factors for progression, natural history and treatment options for diabetic retinopathy  were discussed with patient.    - The need for close monitoring of blood glucose, blood pressure, and serum lipids, avoiding cigarette or any type of tobacco, and the need for long term follow up was also discussed with patient.   - f/u in 1 year, sooner prn  2,3. Hypertensive retinopathy OU  - discussed importance of tight BP control  - monitor   4. Mild Mixed form cataract OU  - The symptoms of cataract, surgical options, and treatments and risks were discussed with patient.  - discussed diagnosis and progression  - very mild  -  monitor  Ophthalmic Meds Ordered this visit:  No orders of the defined types were placed in this encounter.    Return in about 1 year (around 10/12/2024) for f/u DM exam, DFE, OCT.  There are no Patient Instructions on file for this visit.  This document serves as a record of services personally performed by Karie Chimera, MD, PhD. It was created on their behalf by Charlette Caffey, COT an ophthalmic technician. The creation of this record is the provider's dictation and/or activities during the visit.    Electronically signed by:  Charlette Caffey, COT  10/13/23 3:55  PM   Karie Chimera, M.D., Ph.D. Diseases & Surgery of the Retina and Vitreous [facility]  I have reviewed the above documentation for accuracy and completeness, and I agree with the above. Karie Chimera, M.D., Ph.D. 10/13/23 3:56 PM  Abbreviations: M myopia (nearsighted); A astigmatism; H hyperopia (farsighted); P presbyopia; Mrx spectacle prescription;  CTL contact lenses; OD right eye; OS left eye; OU both eyes  XT exotropia; ET esotropia; PEK punctate epithelial keratitis; PEE punctate epithelial erosions; DES dry eye syndrome; MGD meibomian gland dysfunction; ATs artificial tears; PFAT's preservative free artificial tears; NSC nuclear sclerotic cataract; PSC posterior subcapsular cataract; ERM epi-retinal membrane; PVD posterior vitreous detachment; RD retinal detachment; DM diabetes mellitus; DR diabetic retinopathy; NPDR non-proliferative diabetic retinopathy; PDR proliferative diabetic retinopathy; CSME clinically significant macular edema; DME diabetic macular edema; dbh dot blot hemorrhages; CWS cotton wool spot; POAG primary open angle glaucoma; C/D cup-to-disc ratio; HVF humphrey visual field; GVF goldmann visual field; OCT optical coherence tomography; IOP intraocular pressure; BRVO Branch retinal vein occlusion; CRVO central retinal vein occlusion; CRAO central retinal artery occlusion; BRAO branch retinal artery occlusion; RT retinal tear; SB scleral buckle; PPV pars plana vitrectomy; VH Vitreous hemorrhage; PRP panretinal laser photocoagulation; IVK intravitreal kenalog; VMT vitreomacular traction; MH Macular hole;  NVD neovascularization of the disc; NVE neovascularization elsewhere; AREDS age related eye disease study; ARMD age related macular degeneration; POAG primary open angle glaucoma; EBMD epithelial/anterior basement membrane dystrophy; ACIOL anterior chamber intraocular lens; IOL intraocular lens; PCIOL posterior chamber intraocular lens; Phaco/IOL  phacoemulsification with intraocular lens placement; PRK photorefractive keratectomy; LASIK laser assisted in situ keratomileusis; HTN hypertension; DM diabetes mellitus; COPD chronic obstructive pulmonary disease

## 2023-10-06 DIAGNOSIS — Z658 Other specified problems related to psychosocial circumstances: Secondary | ICD-10-CM | POA: Diagnosis not present

## 2023-10-06 DIAGNOSIS — E1169 Type 2 diabetes mellitus with other specified complication: Secondary | ICD-10-CM | POA: Diagnosis not present

## 2023-10-06 DIAGNOSIS — I152 Hypertension secondary to endocrine disorders: Secondary | ICD-10-CM | POA: Diagnosis not present

## 2023-10-06 DIAGNOSIS — E66813 Obesity, class 3: Secondary | ICD-10-CM | POA: Diagnosis not present

## 2023-10-06 DIAGNOSIS — Z6841 Body Mass Index (BMI) 40.0 and over, adult: Secondary | ICD-10-CM | POA: Diagnosis not present

## 2023-10-13 ENCOUNTER — Encounter (INDEPENDENT_AMBULATORY_CARE_PROVIDER_SITE_OTHER): Payer: Medicaid Other | Admitting: Ophthalmology

## 2023-10-13 ENCOUNTER — Ambulatory Visit (INDEPENDENT_AMBULATORY_CARE_PROVIDER_SITE_OTHER): Payer: Medicaid Other | Admitting: Ophthalmology

## 2023-10-13 ENCOUNTER — Encounter (INDEPENDENT_AMBULATORY_CARE_PROVIDER_SITE_OTHER): Payer: Self-pay | Admitting: Ophthalmology

## 2023-10-13 DIAGNOSIS — H35033 Hypertensive retinopathy, bilateral: Secondary | ICD-10-CM | POA: Diagnosis not present

## 2023-10-13 DIAGNOSIS — H25813 Combined forms of age-related cataract, bilateral: Secondary | ICD-10-CM | POA: Diagnosis not present

## 2023-10-13 DIAGNOSIS — E119 Type 2 diabetes mellitus without complications: Secondary | ICD-10-CM | POA: Diagnosis not present

## 2023-10-13 DIAGNOSIS — I10 Essential (primary) hypertension: Secondary | ICD-10-CM

## 2023-11-03 DIAGNOSIS — E1169 Type 2 diabetes mellitus with other specified complication: Secondary | ICD-10-CM | POA: Diagnosis not present

## 2023-11-03 DIAGNOSIS — I152 Hypertension secondary to endocrine disorders: Secondary | ICD-10-CM | POA: Diagnosis not present

## 2023-11-03 DIAGNOSIS — E66813 Obesity, class 3: Secondary | ICD-10-CM | POA: Diagnosis not present

## 2023-11-03 DIAGNOSIS — E7849 Other hyperlipidemia: Secondary | ICD-10-CM | POA: Diagnosis not present

## 2023-11-03 DIAGNOSIS — Z6841 Body Mass Index (BMI) 40.0 and over, adult: Secondary | ICD-10-CM | POA: Diagnosis not present

## 2023-12-15 ENCOUNTER — Ambulatory Visit: Payer: Medicaid Other | Admitting: Family Medicine

## 2023-12-24 ENCOUNTER — Encounter (HOSPITAL_BASED_OUTPATIENT_CLINIC_OR_DEPARTMENT_OTHER): Payer: Self-pay | Admitting: Emergency Medicine

## 2023-12-24 ENCOUNTER — Other Ambulatory Visit: Payer: Self-pay

## 2023-12-24 ENCOUNTER — Emergency Department (HOSPITAL_BASED_OUTPATIENT_CLINIC_OR_DEPARTMENT_OTHER)
Admission: EM | Admit: 2023-12-24 | Discharge: 2023-12-24 | Disposition: A | Discharge status: Home / Self Care | Payer: Self-pay | Attending: Emergency Medicine | Admitting: Emergency Medicine

## 2023-12-24 DIAGNOSIS — L2389 Allergic contact dermatitis due to other agents: Secondary | ICD-10-CM | POA: Insufficient documentation

## 2023-12-24 DIAGNOSIS — Z79899 Other long term (current) drug therapy: Secondary | ICD-10-CM | POA: Insufficient documentation

## 2023-12-24 DIAGNOSIS — E119 Type 2 diabetes mellitus without complications: Secondary | ICD-10-CM | POA: Insufficient documentation

## 2023-12-24 DIAGNOSIS — I10 Essential (primary) hypertension: Secondary | ICD-10-CM | POA: Insufficient documentation

## 2023-12-24 LAB — WET PREP, GENITAL
Clue Cells Wet Prep HPF POC: NONE SEEN
Clue Cells Wet Prep HPF POC: NONE SEEN
Sperm: NONE SEEN
Sperm: NONE SEEN
Trich, Wet Prep: NONE SEEN
Trich, Wet Prep: NONE SEEN
WBC, Wet Prep HPF POC: <10 (ref <10)
WBC, Wet Prep HPF POC: >=10 — AB (ref <10)
Yeast Wet Prep HPF POC: NONE SEEN
Yeast Wet Prep HPF POC: NONE SEEN

## 2023-12-24 NOTE — Discharge Instructions (Signed)
 Your vaginal itching is likely due to the new soap you are using.  Please discontinue use of the soap.  Your yeast, trichomoniasis, and BV testing are negative.  Your gonorrhea and Chlamydia testing have been sent off, but your results will be available in 2 to 3 days on your MyChart portal.  If these are positive, you will be contacted to undergo further treatment.  Return to the ER if you have any abdominal pain, fevers, any other new or concerning symptoms

## 2023-12-24 NOTE — ED Provider Notes (Signed)
 [facility] [facility] AT [facility] Provider Note   CSN: [id number] Arrival date & time: 12/24/23  1803     History {Add pertinent medical, surgical, social history, OB history to HPI:1} Chief Complaint  Patient presents with  . Vaginal Itching    Dorothy Haynes is a 38 y.o. female.   Vaginal Itching      Home Medications Prior to Admission medications   Medication Sig Start Date End Date Taking? Authorizing Provider  azithromycin  (ZITHROMAX ) 250 MG tablet Take 1 tablet (250 mg total) by mouth daily. Take first 2 tablets together, then 1 every day until finished. 06/16/23   Tonya Fredrickson, MD  calcium -vitamin D  (OSCAL WITH D) 500-200 MG-UNIT tablet Take 1 tablet by mouth 2 (two) times daily. 10/14/19   Wenzel, Julie N, PA-C  EQ ALLERGY RELIEF, CETIRIZINE , 10 MG tablet Take 1 tablet by mouth once daily 09/07/23   Abraham Abo, MD  ibuprofen  (ADVIL ) 800 MG tablet Take 1 tablet (800 mg total) by mouth 3 (three) times daily. 10/01/22   Ervin, Michael L, MD  losartan  (COZAAR ) 25 MG tablet TAKE 1 TABLET BY MOUTH AT BEDTIME 05/29/23   Abraham Abo, MD  MOUNJARO  15 MG/0.5ML Pen Inject 15 mg into the skin every Saturday. 09/01/22   [provider]  oxyCODONE -acetaminophen  (PERCOCET/ROXICET) 5-325 MG tablet Take 1 tablet by mouth every 4 (four) hours as needed for moderate pain. 10/01/22   Ervin, Michael L, MD  Glynda Lash Calcium  500 MG TABS 1 tablet with meals Orally once daily.    [provider]  phentermine (ADIPEX-P) 37.5 MG tablet Take 18.75-37.5 mg by mouth daily. 04/06/23   [provider]  pregabalin  (LYRICA ) 75 MG capsule Take 1 capsule (75 mg total) by mouth 2 (two) times daily. 01/15/23 03/16/23  Diedra Fowler, MD  rosuvastatin  (CRESTOR ) 10 MG tablet TAKE 1 TABLET BY MOUTH AT BEDTIME 06/01/23   Abraham Abo, MD  tirzepatide  (MOUNJARO ) 12.5 MG/0.5ML Pen Inject 12.5 mg into the skin once a week. 12/16/22     tirzepatide  (MOUNJARO )  15 MG/0.5ML Pen Inject 15mg  under the skin once weekly. (Can fill 12.5mg  or 15; whichever is available) for 90 days    [provider]  VENTOLIN  HFA 108 (90 Base) MCG/ACT inhaler Inhale 1-2 puffs into the lungs every 4 (four) hours as needed for shortness of breath. 07/30/21   [provider]      Allergies    Patient has no known allergies.    Review of Systems   Review of Systems  Physical Exam Updated Vital Signs BP (!) 140/98 (BP Location: Right Arm)   Pulse (!) 101   Temp 98.9 F (37.2 C)   Resp 17   Ht 5\' 3"  (1.6 m)   Wt 116 kg   LMP  (LMP Unknown)   SpO2 98%   BMI 45.30 kg/m  Physical Exam Vitals and nursing note reviewed. Exam conducted with a chaperone present.  Constitutional:      Appearance: Normal appearance.  HENT:     Head: Atraumatic.  Cardiovascular:     Rate and Rhythm: Normal rate and regular rhythm.  Pulmonary:     Effort: Pulmonary effort is normal.  Abdominal:     General: Abdomen is flat.     Palpations: Abdomen is soft.     Tenderness: There is no abdominal tenderness.  Genitourinary:    Comments: GU exam performed with RN chaperone present Vaginal canal and cervix healthy appearing. White-cream in  the vaginal canal, hard to appreciate if patient is having any abnormal vaginal discharge No cervical motion tenderness Neurological:     General: No focal deficit present.     Mental Status: She is alert.  Psychiatric:        Mood and Affect: Mood normal.        Behavior: Behavior normal.    ED Results / Procedures / Treatments   Labs (all labs ordered are listed, but only abnormal results are displayed) Labs Reviewed  WET PREP, GENITAL    EKG None  Radiology No results found.  Procedures Procedures  {Document cardiac monitor, telemetry assessment procedure when appropriate:1}  Medications Ordered in ED Medications - No data to display  ED Course/ Medical Decision Making/ A&P   {   Click here for ABCD2,  HEART and other calculatorsREFRESH Note before signing :1}                              Medical Decision Making Amount and/or Complexity of Data Reviewed Labs: ordered.   ***  {Document critical care time when appropriate:1} {Document review of labs and clinical decision tools ie heart score, Chads2Vasc2 etc:1}  {Document your independent review of radiology images, and any outside records:1} {Document your discussion with family members, caretakers, and with consultants:1} {Document social determinants of health affecting pt's care:1} {Document your decision making why or why not admission, treatments were needed:1} Final Clinical Impression(s) / ED Diagnoses Final diagnoses:  None    Rx / DC Orders ED Discharge Orders     None

## 2023-12-24 NOTE — ED Triage Notes (Signed)
 Pt via pov from home with vaginal itching x 2 days. Pt states she used some OTC medication that did not help. Pt states this is like prior yeast infections. Pt alert & oriented, nad noted.

## 2023-12-28 LAB — GC/CHLAMYDIA PROBE AMP (~~LOC~~) NOT AT ARMC
Chlamydia: NEGATIVE
Comment: NEGATIVE
Comment: NORMAL
Neisseria Gonorrhea: NEGATIVE

## 2024-01-06 ENCOUNTER — Other Ambulatory Visit: Payer: Self-pay | Admitting: Family Medicine

## 2024-01-06 DIAGNOSIS — Z9109 Other allergy status, other than to drugs and biological substances: Secondary | ICD-10-CM

## 2024-01-14 ENCOUNTER — Encounter: Payer: Self-pay | Admitting: Family Medicine

## 2024-01-14 ENCOUNTER — Ambulatory Visit: Admitting: Family Medicine

## 2024-01-14 VITALS — BP 114/81 | HR 87 | Wt 261.8 lb

## 2024-01-14 DIAGNOSIS — E119 Type 2 diabetes mellitus without complications: Secondary | ICD-10-CM

## 2024-01-14 DIAGNOSIS — L0291 Cutaneous abscess, unspecified: Secondary | ICD-10-CM

## 2024-01-14 LAB — POCT GLYCOSYLATED HEMOGLOBIN (HGB A1C): HbA1c, POC (controlled diabetic range): 7.1 % — AB (ref 0.0–7.0)

## 2024-01-14 MED ORDER — CEPHALEXIN 500 MG PO CAPS
500.0000 mg | ORAL_CAPSULE | Freq: Three times a day (TID) | ORAL | 0 refills | Status: DC
Start: 2024-01-14 — End: 2024-01-28

## 2024-01-14 NOTE — Progress Notes (Signed)
 Established Patient Office Visit  Subjective    Patient ID: Dorothy Haynes, female    DOB: 1986-06-25  Age: 38 y.o. MRN: [id number]  CC:  Chief Complaint  Patient presents with   Medical Management of Chronic Issues    Boil in groin area     HPI Dorothy Haynes presents for follow up of diabetes. Patient also reports a small abscess that she has had for several days. She denies fever/chills.   Outpatient Encounter Medications as of 01/14/2024  Medication Sig   azithromycin  (ZITHROMAX ) 250 MG tablet Take 1 tablet (250 mg total) by mouth daily. Take first 2 tablets together, then 1 every day until finished.   calcium -vitamin D  (OSCAL WITH D) 500-200 MG-UNIT tablet Take 1 tablet by mouth 2 (two) times daily.   cephALEXin  (KEFLEX ) 500 MG capsule Take 1 capsule (500 mg total) by mouth 3 (three) times daily.   EQ ALLERGY RELIEF, CETIRIZINE , 10 MG tablet Take 1 tablet by mouth once daily   ibuprofen  (ADVIL ) 800 MG tablet Take 1 tablet (800 mg total) by mouth 3 (three) times daily.   losartan  (COZAAR ) 25 MG tablet TAKE 1 TABLET BY MOUTH AT BEDTIME   MOUNJARO  15 MG/0.5ML Pen Inject 15 mg into the skin every Saturday.   oxyCODONE -acetaminophen  (PERCOCET/ROXICET) 5-325 MG tablet Take 1 tablet by mouth every 4 (four) hours as needed for moderate pain.   Oyster Shell Calcium  500 MG TABS 1 tablet with meals Orally once daily.   phentermine (ADIPEX-P) 37.5 MG tablet Take 18.75-37.5 mg by mouth daily.   rosuvastatin  (CRESTOR ) 10 MG tablet TAKE 1 TABLET BY MOUTH AT BEDTIME   tirzepatide  (MOUNJARO ) 12.5 MG/0.5ML Pen Inject 12.5 mg into the skin once a week.   tirzepatide  (MOUNJARO ) 15 MG/0.5ML Pen Inject 15mg  under the skin once weekly. (Can fill 12.5mg  or 15; whichever is available) for 90 days   VENTOLIN  HFA 108 (90 Base) MCG/ACT inhaler Inhale 1-2 puffs into the lungs every 4 (four) hours as needed for shortness of breath.   pregabalin  (LYRICA ) 75 MG capsule Take 1 capsule (75 mg total) by mouth  2 (two) times daily.   No facility-administered encounter medications on file as of 01/14/2024.    Past Medical History:  Diagnosis Date   Anxiety    Back pain    Diabetes (HCC)    Diverticulitis 03/15/2018   Gallbladder problem    GERD (gastroesophageal reflux disease) 2009   Gestational diabetes 2008   Headache    History of gallstones 10/2015   s/p cholecystectomy 10/09/15   Hyperlipidemia    Hypertension    Joint pain    Menorrhagia with irregular cycle 10/23/2016   Morbid obesity (HCC) 10/09/2015   Physical deconditioning 06/22/2019   Plantar fasciitis, bilateral 06/02/2019   Shortness of breath dyspnea    Sleep apnea    TMJ (dislocation of temporomandibular joint), initial encounter 06/02/2019   Tobacco abuse 10/09/2015    Past Surgical History:  Procedure Laterality Date   CHOLECYSTECTOMY N/A 10/09/2015   Procedure: LAPAROSCOPIC CHOLECYSTECTOMY ;  Surgeon: Boyce Byes, MD;  Location: [facility];  Service: General;  Laterality: N/A;   VAGINAL HYSTERECTOMY Bilateral 09/30/2022   Procedure: HYSTERECTOMY VAGINAL WITH SALPINGECTOMY;  Surgeon: Othelia Blinks, MD;  Location: [facility];  Service: Gynecology;  Laterality: Bilateral;   WISDOM TOOTH EXTRACTION      Family History  Problem Relation Age of Onset   Drug abuse Mother    Heart disease Mother    Diabetes  Mother    Hypertension Mother    Cataracts Mother    Cancer Father    Throat cancer Father    Kidney Stones Father    Alcoholism Father    Drug abuse Father    Diabetes Maternal Grandmother    Diabetes Maternal Grandfather    Blindness Maternal Aunt    Alzheimer's disease Other    Diabetes Other    Hypertension Other    Heart disease Other    Heart disease Other    Diabetes Other    Hypertension Other    Thyroid  disease Other    Amblyopia Neg Hx    Glaucoma Neg Hx    Macular degeneration Neg Hx    Retinal detachment Neg Hx    Strabismus Neg Hx    Retinitis pigmentosa Neg Hx     Social History    Socioeconomic History   Marital status: Single    Spouse name: Not on file   Number of children: 1   Years of education: college graduate   Highest education level: Not on file  Occupational History    Employer: [employer]    Comment: part-time   Occupation: Press photographer  Tobacco Use   Smoking status: Former    Current packs/day: 0.00    Average packs/day: 0.2 packs/day for 17.0 years (3.4 ttl pk-yrs)    Types: Cigarettes    Start date: 03/01/2001    Quit date: 03/01/2018    Years since quitting: 5.8   Smokeless tobacco: Never  Vaping Use   Vaping status: Never Used  Substance and Sexual Activity   Alcohol use: Not Currently    Comment: rarely   Drug use: No    Comment: previously used MJ, has been off recreational drugs   Sexual activity: Yes    Partners: Male    Birth control/protection: Injection  Other Topics Concern   Not on file  Social History Narrative   Lives with mother and son   Social Drivers of Health   Financial Resource Strain: Low Risk  (06/10/2023)   Overall Financial Resource Strain (CARDIA)    Difficulty of Paying Living Expenses: Not hard at all  Food Insecurity: No Food Insecurity (06/10/2023)   Hunger Vital Sign    Worried About Running Out of Food in the Last Year: Never true    Ran Out of Food in the Last Year: Never true  Transportation Needs: No Transportation Needs (06/10/2023)   PRAPARE - Administrator, Civil Service (Medical): No    Lack of Transportation (Non-Medical): No  Physical Activity: Sufficiently Active (06/10/2023)   Exercise Vital Sign    Days of Exercise per Week: 5 days    Minutes of Exercise per Session: 30 min  Stress: No Stress Concern Present (06/10/2023)   [employer] of Occupational Health - Occupational Stress Questionnaire    Feeling of Stress : Not at all  Social Connections: Socially Isolated (06/10/2023)   Social Connection and Isolation Panel [NHANES]    Frequency of  Communication with Friends and Family: More than three times a week    Frequency of Social Gatherings with Friends and Family: More than three times a week    Attends Religious Services: Never    Database administrator or Organizations: No    Attends Banker Meetings: Never    Marital Status: Never married  Intimate Partner Violence: Not At Risk (06/10/2023)   Humiliation, Afraid, Rape, and Kick questionnaire  Fear of Current or Ex-Partner: No    Emotionally Abused: No    Physically Abused: No    Sexually Abused: No    Review of Systems  All other systems reviewed and are negative.       Objective    BP 114/81 (BP Location: Right Arm, Patient Position: Sitting, Cuff Size: Large)   Pulse 87   Wt 261 lb 12.8 oz (118.8 kg)   LMP  (LMP Unknown)   SpO2 96%   BMI 46.38 kg/m   Physical Exam Vitals and nursing note reviewed.  Constitutional:      General: She is not in acute distress.    Appearance: She is obese.  Cardiovascular:     Rate and Rhythm: Normal rate and regular rhythm.  Pulmonary:     Effort: Pulmonary effort is normal.     Breath sounds: Normal breath sounds.  Abdominal:     Palpations: Abdomen is soft.     Tenderness: There is no abdominal tenderness.  Skin:    Findings: Abscess present.  Neurological:     General: No focal deficit present.     Mental Status: She is alert and oriented to person, place, and time.         Assessment & Plan:  1. Type 2 diabetes mellitus without complication, without long-term current use of insulin  (HCC) (Primary) Increasing A1c. Discussed compliance.  - POCT glycosylated hemoglobin (Hb A1C)  2. Abscess Keflex  prescribed. Skin care discussed.     Return in about 3 months (around 04/15/2024) for follow up, chronic med issues.   Arlo Lama, MD

## 2024-01-18 ENCOUNTER — Telehealth: Payer: Self-pay

## 2024-01-18 ENCOUNTER — Encounter: Payer: Self-pay | Admitting: Family Medicine

## 2024-01-18 NOTE — Telephone Encounter (Signed)
 Copied from CRM [id number]. Topic: Clinical - Medication Question >> Jan 18, 2024  7:50 AM Dorothy Haynes wrote: Reason for CRM: Patient is requesting medication to prevent a yeast infection after taking antibiotics that were prescribed to her for 7 days. Please contact patient at [phone] for additional information.

## 2024-01-20 ENCOUNTER — Other Ambulatory Visit: Payer: Self-pay | Admitting: Family Medicine

## 2024-01-20 MED ORDER — FLUCONAZOLE 150 MG PO TABS: 150.0000 mg | ORAL_TABLET | Freq: Once | ORAL | 0 refills | Status: AC

## 2024-01-28 ENCOUNTER — Encounter (HOSPITAL_COMMUNITY): Payer: Self-pay

## 2024-01-28 ENCOUNTER — Ambulatory Visit (HOSPITAL_COMMUNITY)
Admission: EM | Admit: 2024-01-28 | Discharge: 2024-01-28 | Disposition: A | Discharge status: Home / Self Care | Attending: Emergency Medicine | Admitting: Emergency Medicine

## 2024-01-28 DIAGNOSIS — S161XXA Strain of muscle, fascia and tendon at neck level, initial encounter: Secondary | ICD-10-CM

## 2024-01-28 MED ORDER — BACLOFEN 10 MG PO TABS: 10.0000 mg | ORAL_TABLET | Freq: Three times a day (TID) | ORAL | 0 refills | Status: DC

## 2024-01-28 MED ORDER — LIDOCAINE 4 % EX PTCH: 1.0000 | MEDICATED_PATCH | CUTANEOUS | 0 refills | Status: AC
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
## 2024-01-28 NOTE — Discharge Instructions (Addendum)
 Take baclofen  3 times daily as needed for muscle pain and spasms.  This can make you drowsy so do not drive, work, or drink alcohol while taking this. You can apply lidocaine  patch for 12 hours at a time for additional pain relief. Otherwise alternate between 650 mg of Tylenol  and 400 mg ibuprofen  every 6-8 hours as needed for pain. Alternate between ice and heat and do some gentle stretching to help with pain. Follow-up with [facility] for pain continues. Return here as needed.

## 2024-02-19 DIAGNOSIS — G4733 Obstructive sleep apnea (adult) (pediatric): Secondary | ICD-10-CM | POA: Diagnosis not present

## 2024-02-19 DIAGNOSIS — Z9189 Other specified personal risk factors, not elsewhere classified: Secondary | ICD-10-CM | POA: Diagnosis not present

## 2024-02-19 DIAGNOSIS — I152 Hypertension secondary to endocrine disorders: Secondary | ICD-10-CM | POA: Diagnosis not present

## 2024-02-19 DIAGNOSIS — G8929 Other chronic pain: Secondary | ICD-10-CM | POA: Diagnosis not present

## 2024-02-19 DIAGNOSIS — E1169 Type 2 diabetes mellitus with other specified complication: Secondary | ICD-10-CM | POA: Diagnosis not present

## 2024-04-03 ENCOUNTER — Other Ambulatory Visit: Payer: Self-pay | Admitting: Family Medicine

## 2024-04-03 DIAGNOSIS — Z9109 Other allergy status, other than to drugs and biological substances: Secondary | ICD-10-CM

## 2024-04-05 ENCOUNTER — Telehealth: Payer: Self-pay | Admitting: Family Medicine

## 2024-04-05 NOTE — Telephone Encounter (Signed)
 Provider signature needed for [facility]. Fax form back to [phone]

## 2024-04-06 NOTE — Telephone Encounter (Signed)
 Placed in Dr. Luigi folder for signature

## 2024-04-08 DIAGNOSIS — G4733 Obstructive sleep apnea (adult) (pediatric): Secondary | ICD-10-CM | POA: Diagnosis not present

## 2024-04-08 DIAGNOSIS — I152 Hypertension secondary to endocrine disorders: Secondary | ICD-10-CM | POA: Diagnosis not present

## 2024-04-08 DIAGNOSIS — E1169 Type 2 diabetes mellitus with other specified complication: Secondary | ICD-10-CM | POA: Diagnosis not present

## 2024-04-12 ENCOUNTER — Telehealth: Payer: Self-pay

## 2024-04-12 NOTE — Telephone Encounter (Signed)
 Copied from CRM [id number]. Topic: General - Other >> Apr 11, 2024  4:55 PM Tobias L wrote: Reason for CRM: Sade with [employer] called patient last week to confirm provider's information. Requesting for Dr. Tanda to sign a certificate of medical necessity form. Faxed request on 04/05/24. Requesting follow up on form,   Best callback number: [phone]

## 2024-04-14 NOTE — Telephone Encounter (Signed)
Form faxed to number requested.

## 2024-04-15 ENCOUNTER — Encounter: Payer: Self-pay | Admitting: Family Medicine

## 2024-04-15 ENCOUNTER — Ambulatory Visit (INDEPENDENT_AMBULATORY_CARE_PROVIDER_SITE_OTHER): Admitting: Family Medicine

## 2024-04-15 VITALS — BP 131/83 | HR 91 | Ht 63.0 in | Wt 270.6 lb

## 2024-04-15 DIAGNOSIS — L989 Disorder of the skin and subcutaneous tissue, unspecified: Secondary | ICD-10-CM

## 2024-04-15 MED ORDER — BACITRACIN-POLYMYXIN B 500-10000 UNIT/GM EX OINT: TOPICAL_OINTMENT | Freq: Two times a day (BID) | CUTANEOUS | 0 refills | Status: AC
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
## 2024-04-22 ENCOUNTER — Ambulatory Visit (INDEPENDENT_AMBULATORY_CARE_PROVIDER_SITE_OTHER): Admitting: Family Medicine

## 2024-04-22 ENCOUNTER — Telehealth: Payer: Self-pay

## 2024-04-22 ENCOUNTER — Other Ambulatory Visit: Payer: Self-pay | Admitting: Family Medicine

## 2024-04-22 ENCOUNTER — Encounter: Payer: Self-pay | Admitting: Family Medicine

## 2024-04-22 VITALS — BP 133/94 | HR 85 | Temp 98.2°F | Resp 14 | Ht 63.0 in | Wt 271.2 lb

## 2024-04-22 DIAGNOSIS — Z6841 Body Mass Index (BMI) 40.0 and over, adult: Secondary | ICD-10-CM

## 2024-04-22 DIAGNOSIS — Z7689 Persons encountering health services in other specified circumstances: Secondary | ICD-10-CM

## 2024-04-22 DIAGNOSIS — E66813 Obesity, class 3: Secondary | ICD-10-CM

## 2024-04-22 DIAGNOSIS — I10 Essential (primary) hypertension: Secondary | ICD-10-CM

## 2024-04-22 DIAGNOSIS — E1169 Type 2 diabetes mellitus with other specified complication: Secondary | ICD-10-CM

## 2024-04-22 MED ORDER — CIPROFLOXACIN-DEXAMETHASONE 0.3-0.1 % OT SUSP
4.0000 [drp] | Freq: Two times a day (BID) | OTIC | 0 refills | Status: AC
Start: 2024-04-22 — End: ?

## 2024-04-22 MED ORDER — OZEMPIC (0.25 OR 0.5 MG/DOSE) 2 MG/3ML ~~LOC~~ SOPN: 0.5000 mg | PEN_INJECTOR | SUBCUTANEOUS | 0 refills | Status: AC

## 2024-04-22 MED ORDER — OZEMPIC (0.25 OR 0.5 MG/DOSE) 2 MG/3ML ~~LOC~~ SOPN
0.2500 mg | PEN_INJECTOR | SUBCUTANEOUS | 0 refills | Status: AC
Start: 2024-04-22 — End: ?

## 2024-04-22 NOTE — Progress Notes (Unsigned)
 Established Patient Office Visit  Subjective    Patient ID: Dorothy Haynes, female    DOB: [birth year]  Age: 38 y.o. MRN: [id number]  CC: No chief complaint on file.   HPI [name] presents ***  Outpatient Encounter Medications as of 04/22/2024  Medication Sig   bacitracin -polymyxin b  (POLYSPORIN ) ointment Apply topically 2 (two) times daily.   baclofen  (LIORESAL ) 10 MG tablet Take 1 tablet (10 mg total) by mouth 3 (three) times daily.   calcium -vitamin D  (OSCAL WITH D) 500-200 MG-UNIT tablet Take 1 tablet by mouth 2 (two) times daily.   EQ ALLERGY RELIEF, CETIRIZINE , 10 MG tablet Take 1 tablet by mouth once daily   ibuprofen  (ADVIL ) 800 MG tablet Take 1 tablet (800 mg total) by mouth 3 (three) times daily.   lidocaine  4 % Place 1 patch onto the skin daily.   losartan  (COZAAR ) 25 MG tablet TAKE 1 TABLET BY MOUTH AT BEDTIME   phentermine (ADIPEX-P) 37.5 MG tablet Take 18.75-37.5 mg by mouth daily.   rosuvastatin  (CRESTOR ) 10 MG tablet TAKE 1 TABLET BY MOUTH AT BEDTIME   tirzepatide  (MOUNJARO ) 15 MG/0.5ML Pen Inject 15mg  under the skin once weekly. (Can fill 12.5mg  or 15; whichever is available) for 90 days   VENTOLIN  HFA 108 (90 Base) MCG/ACT inhaler Inhale 1-2 puffs into the lungs every 4 (four) hours as needed for shortness of breath.   No facility-administered encounter medications on file as of 04/22/2024.    Past Medical History:  Diagnosis Date   Anxiety    Back pain    Diabetes (HCC)    Diverticulitis 03/15/2018   Gallbladder problem    GERD (gastroesophageal reflux disease) 2009   Gestational diabetes 2008   Headache    History of gallstones 10/2015   s/p cholecystectomy 10/09/15   Hyperlipidemia    Hypertension    Joint pain    Menorrhagia with irregular cycle 10/23/2016   Morbid obesity (HCC) 10/09/2015   Physical deconditioning 06/22/2019   Plantar fasciitis, bilateral 06/02/2019   Shortness of breath dyspnea    Sleep apnea    TMJ (dislocation of  temporomandibular joint), initial encounter 06/02/2019   Tobacco abuse 10/09/2015    Past Surgical History:  Procedure Laterality Date   CHOLECYSTECTOMY N/A 10/09/2015   Procedure: LAPAROSCOPIC CHOLECYSTECTOMY ;  Surgeon: Elon Pacini, MD;  Location: [facility];  Service: General;  Laterality: N/A;   VAGINAL HYSTERECTOMY Bilateral 09/30/2022   Procedure: HYSTERECTOMY VAGINAL WITH SALPINGECTOMY;  Surgeon: Lorence Ozell LITTIE, MD;  Location: [facility];  Service: Gynecology;  Laterality: Bilateral;   WISDOM TOOTH EXTRACTION      Family History  Problem Relation Age of Onset   Drug abuse Mother    Heart disease Mother    Diabetes Mother    Hypertension Mother    Cataracts Mother    Cancer Father    Throat cancer Father    Kidney Stones Father    Alcoholism Father    Drug abuse Father    Diabetes Maternal Grandmother    Diabetes Maternal Grandfather    Blindness Maternal Aunt    Alzheimer's disease Other    Diabetes Other    Hypertension Other    Heart disease Other    Heart disease Other    Diabetes Other    Hypertension Other    Thyroid  disease Other    Amblyopia Neg Hx    Glaucoma Neg Hx    Macular degeneration Neg Hx    Retinal detachment Neg Hx  Strabismus Neg Hx    Retinitis pigmentosa Neg Hx     Social History   Socioeconomic History   Marital status: Single    Spouse name: Not on file   Number of children: 1   Years of education: college graduate   Highest education level: Not on file  Occupational History    Employer: [employer]    Comment: part-time   Occupation: Press photographer  Tobacco Use   Smoking status: Former    Current packs/day: 0.00    Average packs/day: 0.2 packs/day for 17.0 years (3.4 ttl pk-yrs)    Types: Cigarettes    Start date: 03/01/2001    Quit date: 03/01/2018    Years since quitting: 6.1   Smokeless tobacco: Never  Vaping Use   Vaping status: Never Used  Substance and Sexual Activity   Alcohol use: Not Currently     Comment: rarely   Drug use: No    Comment: previously used MJ, has been off recreational drugs   Sexual activity: Yes    Partners: Male    Birth control/protection: Injection  Other Topics Concern   Not on file  Social History Narrative   Lives with mother and son   Social Drivers of Health   Financial Resource Strain: Low Risk  (06/10/2023)   Overall Financial Resource Strain (CARDIA)    Difficulty of Paying Living Expenses: Not hard at all  Food Insecurity: No Food Insecurity (06/10/2023)   Hunger Vital Sign    Worried About Running Out of Food in the Last Year: Never true    Ran Out of Food in the Last Year: Never true  Transportation Needs: No Transportation Needs (06/10/2023)   PRAPARE - Administrator, Civil Service (Medical): No    Lack of Transportation (Non-Medical): No  Physical Activity: Sufficiently Active (06/10/2023)   Exercise Vital Sign    Days of Exercise per Week: 5 days    Minutes of Exercise per Session: 30 min  Stress: No Stress Concern Present (06/10/2023)   [employer] of Occupational Health - Occupational Stress Questionnaire    Feeling of Stress : Not at all  Social Connections: Socially Isolated (06/10/2023)   Social Connection and Isolation Panel    Frequency of Communication with Friends and Family: More than three times a week    Frequency of Social Gatherings with Friends and Family: More than three times a week    Attends Religious Services: Never    Database administrator or Organizations: No    Attends Banker Meetings: Never    Marital Status: Never married  Intimate Partner Violence: Not At Risk (06/10/2023)   Humiliation, Afraid, Rape, and Kick questionnaire    Fear of Current or Ex-Partner: No    Emotionally Abused: No    Physically Abused: No    Sexually Abused: No    ROS      Objective    LMP  (LMP Unknown)   Physical Exam  {Labs (Optional):23779}    Assessment & Plan:   Type 2 diabetes  mellitus with other specified complication, without long-term current use of insulin  (HCC)  Essential hypertension  Hyperlipidemia associated with type 2 diabetes mellitus (HCC)  Class 3 severe obesity due to excess calories with serious comorbidity and body mass index (BMI) of 45.0 to 49.9 in adult     No follow-ups on file.   Tanda Raguel SQUIBB, MD

## 2024-04-22 NOTE — Telephone Encounter (Signed)
 Copied from CRM #[id number]. Topic: Clinical - Medication Question >> Apr 22, 2024 11:01 AM [name] wrote: Reason for CRM: Patient would like the ear drops meds to be changed because it is too pricey for her please and the Ozempic  the pharmacy stated that's she start 0.25 since its been awhile. Please call patient back regarding this and or have meds sent in today please she requested

## 2024-04-25 ENCOUNTER — Encounter: Payer: Self-pay | Admitting: Family Medicine

## 2024-04-25 NOTE — Telephone Encounter (Signed)
 Copied from CRM #[id number]. Topic: Clinical - Medication Question >> Apr 25, 2024  8:26 AM Anairis L wrote: Reason for CRM: Ozempic  medication is costing patient $920 do to deductible not being met. Also ear drop medication is $84, patient would like a new script.

## 2024-04-25 NOTE — Progress Notes (Signed)
 Established Patient Office Visit  Subjective    Patient ID: Dorothy Haynes, female    DOB: 1986-06-29  Age: 38 y.o. MRN: [id number]  CC:  Chief Complaint  Patient presents with   Diabetes    HPI Dorothy Haynes presents for follow up of  hypertension. She also would like to start meds for weight loss.   Outpatient Encounter Medications as of 04/22/2024  Medication Sig   bacitracin -polymyxin b  (POLYSPORIN ) ointment Apply topically 2 (two) times daily.   baclofen  (LIORESAL ) 10 MG tablet Take 1 tablet (10 mg total) by mouth 3 (three) times daily.   calcium -vitamin D  (OSCAL WITH D) 500-200 MG-UNIT tablet Take 1 tablet by mouth 2 (two) times daily.   EQ ALLERGY RELIEF, CETIRIZINE , 10 MG tablet Take 1 tablet by mouth once daily   ibuprofen  (ADVIL ) 800 MG tablet Take 1 tablet (800 mg total) by mouth 3 (three) times daily.   lidocaine  4 % Place 1 patch onto the skin daily.   losartan  (COZAAR ) 25 MG tablet TAKE 1 TABLET BY MOUTH AT BEDTIME   phentermine (ADIPEX-P) 37.5 MG tablet Take 18.75-37.5 mg by mouth daily.   rosuvastatin  (CRESTOR ) 10 MG tablet TAKE 1 TABLET BY MOUTH AT BEDTIME   Semaglutide ,0.25 or 0.5MG /DOS, (OZEMPIC , 0.25 OR 0.5 MG/DOSE,) 2 MG/3ML SOPN Inject 0.5 mg into the skin once a week.   tirzepatide  (MOUNJARO ) 15 MG/0.5ML Pen Inject 15mg  under the skin once weekly. (Can fill 12.5mg  or 15; whichever is available) for 90 days   VENTOLIN  HFA 108 (90 Base) MCG/ACT inhaler Inhale 1-2 puffs into the lungs every 4 (four) hours as needed for shortness of breath.   No facility-administered encounter medications on file as of 04/22/2024.    Past Medical History:  Diagnosis Date   Anxiety    Back pain    Diabetes (HCC)    Diverticulitis 03/15/2018   Gallbladder problem    GERD (gastroesophageal reflux disease) 2009   Gestational diabetes 2008   Headache    History of gallstones 10/2015   s/p cholecystectomy 10/09/15   Hyperlipidemia    Hypertension    Joint pain     Menorrhagia with irregular cycle 10/23/2016   Morbid obesity (HCC) 10/09/2015   Physical deconditioning 06/22/2019   Plantar fasciitis, bilateral 06/02/2019   Shortness of breath dyspnea    Sleep apnea    TMJ (dislocation of temporomandibular joint), initial encounter 06/02/2019   Tobacco abuse 10/09/2015    Past Surgical History:  Procedure Laterality Date   CHOLECYSTECTOMY N/A 10/09/2015   Procedure: LAPAROSCOPIC CHOLECYSTECTOMY ;  Surgeon: Elon Pacini, MD;  Location: [facility];  Service: General;  Laterality: N/A;   VAGINAL HYSTERECTOMY Bilateral 09/30/2022   Procedure: HYSTERECTOMY VAGINAL WITH SALPINGECTOMY;  Surgeon: Lorence Ozell LITTIE, MD;  Location: [facility];  Service: Gynecology;  Laterality: Bilateral;   WISDOM TOOTH EXTRACTION      Family History  Problem Relation Age of Onset   Drug abuse Mother    Heart disease Mother    Diabetes Mother    Hypertension Mother    Cataracts Mother    Cancer Father    Throat cancer Father    Kidney Stones Father    Alcoholism Father    Drug abuse Father    Diabetes Maternal Grandmother    Diabetes Maternal Grandfather    Blindness Maternal Aunt    Alzheimer's disease Other    Diabetes Other    Hypertension Other    Heart disease Other    Heart  disease Other    Diabetes Other    Hypertension Other    Thyroid  disease Other    Amblyopia Neg Hx    Glaucoma Neg Hx    Macular degeneration Neg Hx    Retinal detachment Neg Hx    Strabismus Neg Hx    Retinitis pigmentosa Neg Hx     Social History   Socioeconomic History   Marital status: Single    Spouse name: Not on file   Number of children: 1   Years of education: college graduate   Highest education level: Not on file  Occupational History    Employer: [employer]     Comment: part-time   Occupation: Press photographer  Tobacco Use   Smoking status: Former    Current packs/day: 0.00    Average packs/day: 0.2 packs/day for 17.0 years (3.4 ttl pk-yrs)    Types:  Cigarettes    Start date: 03/01/2001    Quit date: 03/01/2018    Years since quitting: 6.1   Smokeless tobacco: Never  Vaping Use   Vaping status: Never Used  Substance and Sexual Activity   Alcohol use: Not Currently    Comment: rarely   Drug use: No    Comment: previously used MJ, has been off recreational drugs   Sexual activity: Yes    Partners: Male    Birth control/protection: Injection  Other Topics Concern   Not on file  Social History Narrative   Lives with mother and son   Social Drivers of Health   Financial Resource Strain: Low Risk  (06/10/2023)   Overall Financial Resource Strain (CARDIA)    Difficulty of Paying Living Expenses: Not hard at all  Food Insecurity: No Food Insecurity (06/10/2023)   Hunger Vital Sign    Worried About Running Out of Food in the Last Year: Never true    Ran Out of Food in the Last Year: Never true  Transportation Needs: No Transportation Needs (06/10/2023)   PRAPARE - Administrator, Civil Service (Medical): No    Lack of Transportation (Non-Medical): No  Physical Activity: Sufficiently Active (06/10/2023)   Exercise Vital Sign    Days of Exercise per Week: 5 days    Minutes of Exercise per Session: 30 min  Stress: No Stress Concern Present (06/10/2023)   [employer] of Occupational Health - Occupational Stress Questionnaire    Feeling of Stress : Not at all  Social Connections: Socially Isolated (06/10/2023)   Social Connection and Isolation Panel    Frequency of Communication with Friends and Family: More than three times a week    Frequency of Social Gatherings with Friends and Family: More than three times a week    Attends Religious Services: Never    Database administrator or Organizations: No    Attends Banker Meetings: Never    Marital Status: Never married  Intimate Partner Violence: Not At Risk (06/10/2023)   Humiliation, Afraid, Rape, and Kick questionnaire    Fear of Current or Ex-Partner: No     Emotionally Abused: No    Physically Abused: No    Sexually Abused: No    Review of Systems  All other systems reviewed and are negative.       Objective    BP (!) 133/94 (BP Location: Right Arm, Patient Position: Sitting, Cuff Size: Large)   Pulse 85   Temp 98.2 F (36.8 C) (Oral)   Resp 14   Ht 5' 3 (1.6  m)   Wt 271 lb 3.2 oz (123 kg)   LMP  (LMP Unknown)   SpO2 99%   BMI 48.04 kg/m   Physical Exam Vitals and nursing note reviewed.  Constitutional:      General: She is not in acute distress.    Appearance: She is obese.  Cardiovascular:     Rate and Rhythm: Normal rate and regular rhythm.  Pulmonary:     Effort: Pulmonary effort is normal.     Breath sounds: Normal breath sounds.  Abdominal:     Palpations: Abdomen is soft.     Tenderness: There is no abdominal tenderness.  Neurological:     General: No focal deficit present.     Mental Status: She is alert and oriented to person, place, and time.         Assessment & Plan:   Essential hypertension  Encounter for weight management  Class 3 severe obesity due to excess calories with serious comorbidity and body mass index (BMI) of 45.0 to 49.9 in adult  Other orders -     Ozempic  (0.25 or 0.5 MG/DOSE); Inject 0.5 mg into the skin once a week.  Dispense: 3 mL; Refill: 0     Return in about 4 weeks (around 05/20/2024) for follow up.   Tanda Raguel SQUIBB, MD

## 2024-04-27 ENCOUNTER — Other Ambulatory Visit: Payer: Self-pay | Admitting: Family Medicine

## 2024-04-27 DIAGNOSIS — Z1231 Encounter for screening mammogram for malignant neoplasm of breast: Secondary | ICD-10-CM | POA: Diagnosis not present

## 2024-04-29 NOTE — Telephone Encounter (Signed)
 Copied from CRM #[id number]. Topic: Clinical - Prescription Issue >> Apr 29, 2024  9:21 AM Pinkey ORN wrote: Reason for CRM: Semaglutide ,0.25 or 0.5MG /DOS, (OZEMPIC , 0.25 OR 0.5 MG/DOSE,) 2 MG/3ML SOPN >> Apr 29, 2024  9:23 AM Pinkey ORN wrote: Patient states she called a couple days requesting that something different be called into the pharmacy, since the ear drops and Semaglutide ,0.25 or 0.5MG /DOS, (OZEMPIC , 0.25 OR 0.5 MG/DOSE,) 2 MG/3ML SOPN is too expensive. Could you please follow up with patient at [phone]

## 2024-05-03 ENCOUNTER — Other Ambulatory Visit: Payer: Self-pay | Admitting: Family Medicine

## 2024-05-03 MED ORDER — NEOMYCIN-POLYMYXIN-HC 3.5-10000-1 OT SOLN: 3.0000 [drp] | Freq: Four times a day (QID) | OTIC | 0 refills | Status: AC

## 2024-05-03 NOTE — Telephone Encounter (Signed)
 Spoke with patient. Patient voiced that she has spoken with her insurance rep. She is going to try see how much medication will be with the [facility] card for Wegovy . Will call us  back if needed

## 2024-05-03 NOTE — Progress Notes (Signed)
 Otoic

## 2024-05-04 ENCOUNTER — Other Ambulatory Visit: Payer: Self-pay | Admitting: Family Medicine

## 2024-05-04 MED ORDER — WEGOVY 0.25 MG/0.5ML ~~LOC~~ SOAJ
0.2500 mg | SUBCUTANEOUS | 0 refills | Status: AC
Start: 2024-05-04 — End: ?

## 2024-05-04 NOTE — Telephone Encounter (Signed)
 Patient notified Rx were called.

## 2024-05-04 NOTE — Telephone Encounter (Signed)
 Spoke with patient and was able to get her a discount card for wegovy  and neomycin -polymyxin-hydrocortisone (CORTISPORIN) OTIC solution .    Can you send wevovy to [facility] [address] [place], [place] - [address] Thank you

## 2024-05-11 ENCOUNTER — Ambulatory Visit

## 2024-05-11 ENCOUNTER — Other Ambulatory Visit: Payer: Self-pay | Admitting: Family Medicine

## 2024-05-11 DIAGNOSIS — Z9109 Other allergy status, other than to drugs and biological substances: Secondary | ICD-10-CM

## 2024-05-25 DIAGNOSIS — I152 Hypertension secondary to endocrine disorders: Secondary | ICD-10-CM | POA: Diagnosis not present

## 2024-05-25 DIAGNOSIS — E1169 Type 2 diabetes mellitus with other specified complication: Secondary | ICD-10-CM | POA: Diagnosis not present

## 2024-06-03 ENCOUNTER — Telehealth: Payer: Self-pay

## 2024-06-03 ENCOUNTER — Other Ambulatory Visit: Payer: Self-pay

## 2024-06-03 NOTE — Telephone Encounter (Signed)
 error

## 2024-06-08 ENCOUNTER — Telehealth: Payer: Self-pay

## 2024-06-08 ENCOUNTER — Other Ambulatory Visit: Payer: Self-pay | Admitting: Family Medicine

## 2024-06-08 MED ORDER — MOUNJARO 15 MG/0.5ML ~~LOC~~ SOAJ: 15.0000 mg | SUBCUTANEOUS | 0 refills | Status: AC

## 2024-06-08 NOTE — Telephone Encounter (Signed)
 Spoke with patient . Advised patient that medical insurance will not cover al the cost of GLP1;s for weight loss. Patient voiced that she was going to call her PCP to discuss with her.

## 2024-06-08 NOTE — Telephone Encounter (Signed)
 Copied from CRM #[id number]. Topic: Clinical - Medication Question >> Jun 08, 2024 10:02 AM Jasmin G wrote: Reason for CRM: Pt wanted to send a message to Dr. Luigi nurses to request for either Mounjaro  or Ozempic  to be refilled using the reasoning as to why she needs her med for diabetes and not for weight loss, as she states that her Insurance would only cover it that way.

## 2024-06-08 NOTE — Telephone Encounter (Signed)
 Copied from CRM #[id number]. Topic: Clinical - Medication Question >> Jun 08, 2024  3:08 PM Shanda MATSU wrote: Reason for CRM: Patient calling back to speak with nurse in regards to trying to get Mounjaro  or Ozempic  cost lowered, however, when using the advice that she was advised by the person she spoke with this morning the cost is still to high, patient is req a call back.

## 2024-07-07 DIAGNOSIS — I152 Hypertension secondary to endocrine disorders: Secondary | ICD-10-CM | POA: Diagnosis not present

## 2024-07-07 DIAGNOSIS — E66813 Obesity, class 3: Secondary | ICD-10-CM | POA: Diagnosis not present

## 2024-07-07 DIAGNOSIS — E1169 Type 2 diabetes mellitus with other specified complication: Secondary | ICD-10-CM | POA: Diagnosis not present

## 2024-07-25 ENCOUNTER — Ambulatory Visit: Admitting: Family Medicine

## 2024-08-10 ENCOUNTER — Ambulatory Visit (INDEPENDENT_AMBULATORY_CARE_PROVIDER_SITE_OTHER): Admitting: Family Medicine

## 2024-08-10 ENCOUNTER — Encounter: Payer: Self-pay | Admitting: Family Medicine

## 2024-08-10 ENCOUNTER — Ambulatory Visit: Payer: Self-pay | Admitting: Family Medicine

## 2024-08-10 VITALS — BP 130/89 | HR 76 | Ht 63.0 in | Wt 265.0 lb

## 2024-08-10 DIAGNOSIS — Z6841 Body Mass Index (BMI) 40.0 and over, adult: Secondary | ICD-10-CM

## 2024-08-10 DIAGNOSIS — I10 Essential (primary) hypertension: Secondary | ICD-10-CM | POA: Diagnosis not present

## 2024-08-10 DIAGNOSIS — E66813 Obesity, class 3: Secondary | ICD-10-CM

## 2024-08-10 DIAGNOSIS — E785 Hyperlipidemia, unspecified: Secondary | ICD-10-CM

## 2024-08-10 DIAGNOSIS — E1169 Type 2 diabetes mellitus with other specified complication: Secondary | ICD-10-CM

## 2024-08-10 LAB — POCT GLYCOSYLATED HEMOGLOBIN (HGB A1C): HbA1c, POC (controlled diabetic range): 7.5 % — AB (ref 0.0–7.0)

## 2024-08-10 MED ORDER — BACLOFEN 10 MG PO TABS: 10.0000 mg | ORAL_TABLET | Freq: Three times a day (TID) | ORAL | 0 refills | Status: AC

## 2024-08-10 MED ORDER — TIRZEPATIDE 5 MG/0.5ML ~~LOC~~ SOAJ: 5.0000 mg | SUBCUTANEOUS | 0 refills | Status: AC

## 2024-08-15 ENCOUNTER — Encounter: Payer: Self-pay | Admitting: Family Medicine

## 2024-08-15 NOTE — Progress Notes (Signed)
 Established Patient Office Visit  Subjective    Patient ID: Dorothy Haynes, female    DOB: 1986/05/04  Age: 38 y.o. MRN: [id number]  CC:  Chief Complaint  Patient presents with   Medical Management of Chronic Issues    HPI Dorothy Haynes presents for routine follow up of chronic med issues including diabetes and hypertension. Patient reports med compliance and denies acute complaints.   Outpatient Encounter Medications as of 08/10/2024  Medication Sig   EQ ALLERGY RELIEF, CETIRIZINE , 10 MG tablet Take 1 tablet by mouth once daily   losartan  (COZAAR ) 25 MG tablet TAKE 1 TABLET BY MOUTH AT BEDTIME   phentermine (ADIPEX-P) 37.5 MG tablet Take 18.75-37.5 mg by mouth daily.   rosuvastatin  (CRESTOR ) 10 MG tablet TAKE 1 TABLET BY MOUTH AT BEDTIME   tirzepatide  (MOUNJARO ) 5 MG/0.5ML Pen Inject 5 mg into the skin once a week.   VENTOLIN  HFA 108 (90 Base) MCG/ACT inhaler Inhale 1-2 puffs into the lungs every 4 (four) hours as needed for shortness of breath.   bacitracin -polymyxin b  (POLYSPORIN ) ointment Apply topically 2 (two) times daily. (Patient not taking: Reported on 08/10/2024)   baclofen  (LIORESAL ) 10 MG tablet Take 1 tablet (10 mg total) by mouth 3 (three) times daily.   calcium -vitamin D  (OSCAL WITH D) 500-200 MG-UNIT tablet Take 1 tablet by mouth 2 (two) times daily. (Patient not taking: Reported on 08/10/2024)   ciprofloxacin -dexamethasone  (CIPRODEX ) OTIC suspension Place 4 drops into the left ear 2 (two) times daily. (Patient not taking: Reported on 08/10/2024)   ibuprofen  (ADVIL ) 800 MG tablet Take 1 tablet (800 mg total) by mouth 3 (three) times daily. (Patient not taking: Reported on 08/10/2024)   lidocaine  4 % Place 1 patch onto the skin daily. (Patient not taking: Reported on 08/10/2024)   neomycin -polymyxin-hydrocortisone (CORTISPORIN) OTIC solution Place 3 drops into the left ear 4 (four) times daily. (Patient not taking: Reported on 08/10/2024)   Semaglutide ,0.25 or  0.5MG /DOS, (OZEMPIC , 0.25 OR 0.5 MG/DOSE,) 2 MG/3ML SOPN Inject 0.5 mg into the skin once a week.   Semaglutide ,0.25 or 0.5MG /DOS, (OZEMPIC , 0.25 OR 0.5 MG/DOSE,) 2 MG/3ML SOPN Inject 0.25 mg into the skin once a week.   semaglutide -weight management (WEGOVY ) 0.25 MG/0.5ML SOAJ SQ injection Inject 0.25 mg into the skin once a week.   tirzepatide  (MOUNJARO ) 15 MG/0.5ML Pen Inject 15 mg into the skin once a week.   [DISCONTINUED] baclofen  (LIORESAL ) 10 MG tablet Take 1 tablet (10 mg total) by mouth 3 (three) times daily.   No facility-administered encounter medications on file as of 08/10/2024.    Past Medical History:  Diagnosis Date   Anxiety    Back pain    Diabetes (HCC)    Diverticulitis 03/15/2018   Gallbladder problem    GERD (gastroesophageal reflux disease) 2009   Gestational diabetes 2008   Headache    History of gallstones 10/2015   s/p cholecystectomy 10/09/15   Hyperlipidemia    Hypertension    Joint pain    Menorrhagia with irregular cycle 10/23/2016   Morbid obesity (HCC) 10/09/2015   Physical deconditioning 06/22/2019   Plantar fasciitis, bilateral 06/02/2019   Shortness of breath dyspnea    Sleep apnea    TMJ (dislocation of temporomandibular joint), initial encounter 06/02/2019   Tobacco abuse 10/09/2015    Past Surgical History:  Procedure Laterality Date   CHOLECYSTECTOMY N/A 10/09/2015   Procedure: LAPAROSCOPIC CHOLECYSTECTOMY ;  Surgeon: Elon Pacini, MD;  Location: [facility];  Service: General;  Laterality:  N/A;   VAGINAL HYSTERECTOMY Bilateral 09/30/2022   Procedure: HYSTERECTOMY VAGINAL WITH SALPINGECTOMY;  Surgeon: Lorence Ozell CROME, MD;  Location: [facility] OR;  Service: Gynecology;  Laterality: Bilateral;   WISDOM TOOTH EXTRACTION      Family History  Problem Relation Age of Onset   Drug abuse Mother    Heart disease Mother    Diabetes Mother    Hypertension Mother    Cataracts Mother    Cancer Father    Throat cancer Father    Kidney Stones Father     Alcoholism Father    Drug abuse Father    Diabetes Maternal Grandmother    Diabetes Maternal Grandfather    Blindness Maternal Aunt    Alzheimer's disease Other    Diabetes Other    Hypertension Other    Heart disease Other    Heart disease Other    Diabetes Other    Hypertension Other    Thyroid  disease Other    Amblyopia Neg Hx    Glaucoma Neg Hx    Macular degeneration Neg Hx    Retinal detachment Neg Hx    Strabismus Neg Hx    Retinitis pigmentosa Neg Hx     Social History   Socioeconomic History   Marital status: Single    Spouse name: Not on file   Number of children: 1   Years of education: college graduate   Highest education level: Not on file  Occupational History    Employer: [employer]    Comment: part-time   Occupation: Press photographer  Tobacco Use   Smoking status: Former    Current packs/day: 0.00    Average packs/day: 0.2 packs/day for 17.0 years (3.4 ttl pk-yrs)    Types: Cigarettes    Start date: 03/01/2001    Quit date: 03/01/2018    Years since quitting: 6.4   Smokeless tobacco: Never  Vaping Use   Vaping status: Never Used  Substance and Sexual Activity   Alcohol use: Not Currently    Comment: rarely   Drug use: No    Comment: previously used MJ, has been off recreational drugs   Sexual activity: Yes    Partners: Male    Birth control/protection: Injection  Other Topics Concern   Not on file  Social History Narrative   Lives with mother and son   Social Drivers of Health   Tobacco Use: Medium Risk (08/10/2024)   Patient History    Smoking Tobacco Use: Former    Smokeless Tobacco Use: Never    Passive Exposure: Not on Actuary Strain: Low Risk (06/10/2023)   Overall Financial Resource Strain (CARDIA)    Difficulty of Paying Living Expenses: Not hard at all  Food Insecurity: No Food Insecurity (06/10/2023)   Hunger Vital Sign    Worried About Running Out of Food in the Last Year: Never true    Ran Out of  Food in the Last Year: Never true  Transportation Needs: No Transportation Needs (06/10/2023)   PRAPARE - Administrator, Civil Service (Medical): No    Lack of Transportation (Non-Medical): No  Physical Activity: Sufficiently Active (06/10/2023)   Exercise Vital Sign    Days of Exercise per Week: 5 days    Minutes of Exercise per Session: 30 min  Stress: No Stress Concern Present (06/10/2023)   [employer] of Occupational Health - Occupational Stress Questionnaire    Feeling of Stress : Not at all  Social Connections: Socially  Isolated (06/10/2023)   Social Connection and Isolation Panel    Frequency of Communication with Friends and Family: More than three times a week    Frequency of Social Gatherings with Friends and Family: More than three times a week    Attends Religious Services: Never    Database Administrator or Organizations: No    Attends Banker Meetings: Never    Marital Status: Never married  Intimate Partner Violence: Not At Risk (06/10/2023)   Humiliation, Afraid, Rape, and Kick questionnaire    Fear of Current or Ex-Partner: No    Emotionally Abused: No    Physically Abused: No    Sexually Abused: No  Depression (PHQ2-9): Low Risk (04/22/2024)   Depression (PHQ2-9)    PHQ-2 Score: 0  Alcohol Screen: Low Risk (06/10/2023)   Alcohol Screen    Last Alcohol Screening Score (AUDIT): 1  Housing: Low Risk (06/10/2023)   Housing    Last Housing Risk Score: 0  Utilities: Not At Risk (06/10/2023)   AHC Utilities    Threatened with loss of utilities: No  Health Literacy: Adequate Health Literacy (06/10/2023)   B1300 Health Literacy    Frequency of need for help with medical instructions: Never    Review of Systems  All other systems reviewed and are negative.       Objective    BP 130/89   Pulse 76   Ht 5' 3 (1.6 m)   Wt 265 lb (120.2 kg)   LMP  (LMP Unknown)   SpO2 98%   BMI 46.94 kg/m   Physical Exam Vitals and nursing note  reviewed.  Constitutional:      General: She is not in acute distress.    Appearance: She is obese.  Cardiovascular:     Rate and Rhythm: Normal rate and regular rhythm.  Pulmonary:     Effort: Pulmonary effort is normal.     Breath sounds: Normal breath sounds.  Abdominal:     Palpations: Abdomen is soft.     Tenderness: There is no abdominal tenderness.  Neurological:     General: No focal deficit present.     Mental Status: She is alert and oriented to person, place, and time.         Assessment & Plan:   Type 2 diabetes mellitus with other specified complication, without long-term current use of insulin  (HCC) -     POCT glycosylated hemoglobin (Hb A1C)  Essential hypertension  Hyperlipidemia associated with type 2 diabetes mellitus (HCC)  Class 3 severe obesity due to excess calories with serious comorbidity and body mass index (BMI) of 45.0 to 49.9 in adult [facility])  Other orders -     Baclofen ; Take 1 tablet (10 mg total) by mouth 3 (three) times daily.  Dispense: 30 each; Refill: 0 -     Tirzepatide ; Inject 5 mg into the skin once a week.  Dispense: 6 mL; Refill: 0   Increasing A1c and just above goal. Continue.   No follow-ups on file.   Tanda Raguel SQUIBB, MD

## 2024-08-29 DIAGNOSIS — E1169 Type 2 diabetes mellitus with other specified complication: Secondary | ICD-10-CM | POA: Diagnosis not present

## 2024-08-29 DIAGNOSIS — I152 Hypertension secondary to endocrine disorders: Secondary | ICD-10-CM | POA: Diagnosis not present

## 2024-08-29 DIAGNOSIS — K219 Gastro-esophageal reflux disease without esophagitis: Secondary | ICD-10-CM | POA: Diagnosis not present

## 2024-09-02 NOTE — Telephone Encounter (Signed)
 Pt scheduled for clinical support appt

## 2024-09-02 NOTE — Telephone Encounter (Signed)
 Reason for CRM: Pls call patient.. needs to do TB and any other shots that are missing on her my chart ( Hep is overdue )   Patient need appt for NV

## 2024-09-09 ENCOUNTER — Ambulatory Visit: Payer: Self-pay

## 2024-09-09 ENCOUNTER — Ambulatory Visit (INDEPENDENT_AMBULATORY_CARE_PROVIDER_SITE_OTHER)

## 2024-09-09 DIAGNOSIS — Z23 Encounter for immunization: Secondary | ICD-10-CM

## 2024-09-15 ENCOUNTER — Ambulatory Visit: Admitting: Family Medicine

## 2024-09-16 ENCOUNTER — Other Ambulatory Visit: Payer: Self-pay | Admitting: Family Medicine

## 2024-09-16 ENCOUNTER — Telehealth: Payer: Self-pay

## 2024-09-16 DIAGNOSIS — Z9109 Other allergy status, other than to drugs and biological substances: Secondary | ICD-10-CM

## 2024-09-16 NOTE — Telephone Encounter (Signed)
 Copied from CRM [id number]. Topic: Clinical - Medication Refill >> Sep 16, 2024  9:24 AM Travis F wrote: Medication: losartan  (COZAAR ) 25 MG tablet [[id number]]  Has the patient contacted their pharmacy? Yes  (Agent: If yes, when and what did the pharmacy advise?) contact office no refills   This is the patient's preferred pharmacy:  [facility] [address] [place] - [address] [place] [address] Phone: [phone] Fax: [phone]  Is this the correct pharmacy for this prescription? Yes If no, delete pharmacy and type the correct one.   Has the prescription been filled recently? Yes  Is the patient out of the medication? No, has a few left   Has the patient been seen for an appointment in the last year OR does the patient have an upcoming appointment? Yes  Can we respond through MyChart? Yes  Agent: Please be advised that Rx refills may take up to 3 business days. We ask that you follow-up with your pharmacy.

## 2024-10-05 NOTE — Progress Notes (Shared)
 " [facility] - Clinic Note  10/12/2024     CHIEF COMPLAINT Patient presents for No chief complaint on file.   HISTORY OF PRESENT ILLNESS: Dorothy Haynes is a 39 y.o. female who presents to the clinic today for:    Pt states   Referring physician: Tanda Bleacher, MD [address] [place],  [place] [address]  HISTORICAL INFORMATION:   Selected notes from the MEDICAL RECORD NUMBER Referred by Dr. Sallyann Robins for DM exam LEE:  Ocular Hx- PMH-DM (A1C: 7.9, metformin ), HTN, smoker    CURRENT MEDICATIONS: No current outpatient medications on file. (Ophthalmic Drugs)   No current facility-administered medications for this visit. (Ophthalmic Drugs)   Current Outpatient Medications (Other)  Medication Sig   bacitracin -polymyxin b  (POLYSPORIN ) ointment Apply topically 2 (two) times daily. (Patient not taking: Reported on 08/10/2024)   baclofen  (LIORESAL ) 10 MG tablet Take 1 tablet (10 mg total) by mouth 3 (three) times daily.   calcium -vitamin D  (OSCAL WITH D) 500-200 MG-UNIT tablet Take 1 tablet by mouth 2 (two) times daily. (Patient not taking: Reported on 08/10/2024)   ciprofloxacin -dexamethasone  (CIPRODEX ) OTIC suspension Place 4 drops into the left ear 2 (two) times daily. (Patient not taking: Reported on 08/10/2024)   EQ ALLERGY RELIEF, CETIRIZINE , 10 MG tablet Take 1 tablet by mouth once daily   ibuprofen  (ADVIL ) 800 MG tablet Take 1 tablet (800 mg total) by mouth 3 (three) times daily. (Patient not taking: Reported on 08/10/2024)   lidocaine  4 % Place 1 patch onto the skin daily. (Patient not taking: Reported on 08/10/2024)   losartan  (COZAAR ) 25 MG tablet TAKE 1 TABLET BY MOUTH AT BEDTIME   neomycin -polymyxin-hydrocortisone (CORTISPORIN) OTIC solution Place 3 drops into the left ear 4 (four) times daily. (Patient not taking: Reported on 08/10/2024)   phentermine (ADIPEX-P) 37.5 MG tablet Take 18.75-37.5 mg by mouth daily.   rosuvastatin   (CRESTOR ) 10 MG tablet TAKE 1 TABLET BY MOUTH AT BEDTIME   Semaglutide ,0.25 or 0.5MG /DOS, (OZEMPIC , 0.25 OR 0.5 MG/DOSE,) 2 MG/3ML SOPN Inject 0.5 mg into the skin once a week.   Semaglutide ,0.25 or 0.5MG /DOS, (OZEMPIC , 0.25 OR 0.5 MG/DOSE,) 2 MG/3ML SOPN Inject 0.25 mg into the skin once a week.   semaglutide -weight management (WEGOVY ) 0.25 MG/0.5ML SOAJ SQ injection Inject 0.25 mg into the skin once a week.   tirzepatide  (MOUNJARO ) 15 MG/0.5ML Pen Inject 15 mg into the skin once a week.   tirzepatide  (MOUNJARO ) 5 MG/0.5ML Pen Inject 5 mg into the skin once a week.   VENTOLIN  HFA 108 (90 Base) MCG/ACT inhaler Inhale 1-2 puffs into the lungs every 4 (four) hours as needed for shortness of breath.   No current facility-administered medications for this visit. (Other)   REVIEW OF SYSTEMS:   ALLERGIES No Known Allergies  PAST MEDICAL HISTORY Past Medical History:  Diagnosis Date   Anxiety    Back pain    Diabetes (HCC)    Diverticulitis 03/15/2018   Gallbladder problem    GERD (gastroesophageal reflux disease) 2009   Gestational diabetes 2008   Headache    History of gallstones 10/2015   s/p cholecystectomy 10/09/15   Hyperlipidemia    Hypertension    Joint pain    Menorrhagia with irregular cycle 10/23/2016   Morbid obesity (HCC) 10/09/2015   Physical deconditioning 06/22/2019   Plantar fasciitis, bilateral 06/02/2019   Shortness of breath dyspnea    Sleep apnea    TMJ (dislocation of temporomandibular joint), initial encounter 06/02/2019  Tobacco abuse 10/09/2015   Past Surgical History:  Procedure Laterality Date   CHOLECYSTECTOMY N/A 10/09/2015   Procedure: LAPAROSCOPIC CHOLECYSTECTOMY ;  Surgeon: Elon Pacini, MD;  Location: [facility];  Service: General;  Laterality: N/A;   VAGINAL HYSTERECTOMY Bilateral 09/30/2022   Procedure: HYSTERECTOMY VAGINAL WITH SALPINGECTOMY;  Surgeon: Lorence Ozell CROME, MD;  Location: [facility] OR;  Service: Gynecology;  Laterality: Bilateral;   WISDOM  TOOTH EXTRACTION      FAMILY HISTORY Family History  Problem Relation Age of Onset   Drug abuse Mother    Heart disease Mother    Diabetes Mother    Hypertension Mother    Cataracts Mother    Cancer Father    Throat cancer Father    Kidney Stones Father    Alcoholism Father    Drug abuse Father    Diabetes Maternal Grandmother    Diabetes Maternal Grandfather    Blindness Maternal Aunt    Alzheimer's disease Other    Diabetes Other    Hypertension Other    Heart disease Other    Heart disease Other    Diabetes Other    Hypertension Other    Thyroid  disease Other    Amblyopia Neg Hx    Glaucoma Neg Hx    Macular degeneration Neg Hx    Retinal detachment Neg Hx    Strabismus Neg Hx    Retinitis pigmentosa Neg Hx    SOCIAL HISTORY Social History   Tobacco Use   Smoking status: Former    Current packs/day: 0.00    Average packs/day: 0.2 packs/day for 17.0 years (3.4 ttl pk-yrs)    Types: Cigarettes    Start date: 03/01/2001    Quit date: 03/01/2018    Years since quitting: 6.6   Smokeless tobacco: Never  Vaping Use   Vaping status: Never Used  Substance Use Topics   Alcohol use: Not Currently    Comment: rarely   Drug use: No    Comment: previously used MJ, has been off recreational drugs       OPHTHALMIC EXAM:  Not recorded     IMAGING AND PROCEDURES  Imaging and Procedures for @TODAY @           ASSESSMENT/PLAN: No diagnosis found.  1. Diabetes mellitus, type 2 without retinopathy  - A1c 6.6 on 01.14.25  - BCVA remains 20/20 OU  - no DME or heme on exam or OCT  - The incidence, risk factors for progression, natural history and treatment options for diabetic retinopathy  were discussed with patient.    - The need for close monitoring of blood glucose, blood pressure, and serum lipids, avoiding cigarette or any type of tobacco, and the need for long term follow up was also discussed with patient.   - f/u in 1 year, sooner prn  2,3. Hypertensive  retinopathy OU  - discussed importance of tight BP control  - monitor   4. Mild Mixed form cataract OU  - The symptoms of cataract, surgical options, and treatments and risks were discussed with patient.  - discussed diagnosis and progression  - very mild  - monitor  Ophthalmic Meds Ordered this visit:  No orders of the defined types were placed in this encounter.    No follow-ups on file.  There are no Patient Instructions on file for this visit.  This document serves as a record of services personally performed by Redell JUDITHANN Hans, MD, PhD. It was created on their behalf by Almetta Pesa, an  ophthalmic technician. The creation of this record is the provider's dictation and/or activities during the visit.    Electronically signed by: Almetta Pesa, OA, 10/05/24  9:15 AM    Redell JUDITHANN Hans, M.D., Ph.D. Diseases & Surgery of the Retina and Vitreous [facility]   Abbreviations: M myopia (nearsighted); A astigmatism; H hyperopia (farsighted); P presbyopia; Mrx spectacle prescription;  CTL contact lenses; OD right eye; OS left eye; OU both eyes  XT exotropia; ET esotropia; PEK punctate epithelial keratitis; PEE punctate epithelial erosions; DES dry eye syndrome; MGD meibomian gland dysfunction; ATs artificial tears; PFAT's preservative free artificial tears; NSC nuclear sclerotic cataract; PSC posterior subcapsular cataract; ERM epi-retinal membrane; PVD posterior vitreous detachment; RD retinal detachment; DM diabetes mellitus; DR diabetic retinopathy; NPDR non-proliferative diabetic retinopathy; PDR proliferative diabetic retinopathy; CSME clinically significant macular edema; DME diabetic macular edema; dbh dot blot hemorrhages; CWS cotton wool spot; POAG primary open angle glaucoma; C/D cup-to-disc ratio; HVF humphrey visual field; GVF goldmann visual field; OCT optical coherence tomography; IOP intraocular pressure; BRVO Branch retinal vein occlusion; CRVO  central retinal vein occlusion; CRAO central retinal artery occlusion; BRAO branch retinal artery occlusion; RT retinal tear; SB scleral buckle; PPV pars plana vitrectomy; VH Vitreous hemorrhage; PRP panretinal laser photocoagulation; IVK intravitreal kenalog; VMT vitreomacular traction; MH Macular hole;  NVD neovascularization of the disc; NVE neovascularization elsewhere; AREDS age related eye disease study; ARMD age related macular degeneration; POAG primary open angle glaucoma; EBMD epithelial/anterior basement membrane dystrophy; ACIOL anterior chamber intraocular lens; IOL intraocular lens; PCIOL posterior chamber intraocular lens; Phaco/IOL phacoemulsification with intraocular lens placement; PRK photorefractive keratectomy; LASIK laser assisted in situ keratomileusis; HTN hypertension; DM diabetes mellitus; COPD chronic obstructive pulmonary disease "

## 2024-10-12 ENCOUNTER — Encounter (INDEPENDENT_AMBULATORY_CARE_PROVIDER_SITE_OTHER): Payer: Medicaid Other | Admitting: Ophthalmology

## 2024-10-12 DIAGNOSIS — I10 Essential (primary) hypertension: Secondary | ICD-10-CM

## 2024-10-12 DIAGNOSIS — H35033 Hypertensive retinopathy, bilateral: Secondary | ICD-10-CM

## 2024-10-12 DIAGNOSIS — H25813 Combined forms of age-related cataract, bilateral: Secondary | ICD-10-CM

## 2024-10-12 DIAGNOSIS — E119 Type 2 diabetes mellitus without complications: Secondary | ICD-10-CM

## 2024-11-09 ENCOUNTER — Ambulatory Visit: Admitting: Family Medicine
